# Patient Record
Sex: Female | Born: 1945
Health system: Southern US, Community
[De-identification: ages and names within clinical notes are randomized; demographics above are authoritative.]

## PROBLEM LIST (undated history)

## (undated) DIAGNOSIS — E785 Hyperlipidemia, unspecified: Secondary | ICD-10-CM

## (undated) DIAGNOSIS — B019 Varicella without complication: Secondary | ICD-10-CM

## (undated) DIAGNOSIS — M069 Rheumatoid arthritis, unspecified: Secondary | ICD-10-CM

## (undated) HISTORY — PX: PLACEMENT OF BREAST IMPLANTS: SHX6334

## (undated) HISTORY — DX: Varicella without complication: B01.9

## (undated) HISTORY — PX: BREAST BIOPSY: SHX20

## (undated) HISTORY — DX: Hyperlipidemia, unspecified: E78.5

## (undated) HISTORY — PX: LIPOMA EXCISION: SHX5283

## (undated) HISTORY — DX: Rheumatoid arthritis, unspecified: M06.9

## (undated) HISTORY — PX: SEPTOPLASTY: SUR1290

## (undated) HISTORY — PX: ABDOMINOPLASTY: SUR9

## (undated) HISTORY — PX: RHINOPLASTY: SUR1284

---

## 2008-10-20 DIAGNOSIS — L255 Unspecified contact dermatitis due to plants, except food: Secondary | ICD-10-CM | POA: Insufficient documentation

## 2008-10-20 HISTORY — DX: Unspecified contact dermatitis due to plants, except food: L25.5

## 2011-06-14 LAB — HM COLONOSCOPY

## 2011-11-02 DIAGNOSIS — D170 Benign lipomatous neoplasm of skin and subcutaneous tissue of head, face and neck: Secondary | ICD-10-CM

## 2011-11-02 HISTORY — DX: Benign lipomatous neoplasm of skin and subcutaneous tissue of head, face and neck: D17.0

## 2013-04-18 DIAGNOSIS — R09A2 Foreign body sensation, throat: Secondary | ICD-10-CM | POA: Insufficient documentation

## 2013-04-18 DIAGNOSIS — R198 Other specified symptoms and signs involving the digestive system and abdomen: Secondary | ICD-10-CM

## 2013-04-18 DIAGNOSIS — R0989 Other specified symptoms and signs involving the circulatory and respiratory systems: Secondary | ICD-10-CM

## 2013-04-18 HISTORY — DX: Other specified symptoms and signs involving the digestive system and abdomen: R19.8

## 2013-04-18 HISTORY — DX: Other specified symptoms and signs involving the circulatory and respiratory systems: R09.89

## 2013-04-18 HISTORY — DX: Foreign body sensation, throat: R09.A2

## 2014-10-12 LAB — HM MAMMOGRAPHY: HM Mammogram: NORMAL

## 2014-10-12 LAB — HM PAP SMEAR

## 2015-03-02 ENCOUNTER — Telehealth: Payer: Self-pay | Admitting: Behavioral Health

## 2015-03-02 ENCOUNTER — Encounter: Payer: Self-pay | Admitting: Behavioral Health

## 2015-03-02 NOTE — Telephone Encounter (Signed)
Pre-Visit Call completed with patient and chart updated.   Pre-Visit Info documented in Specialty Comments under [employer].    

## 2015-03-03 ENCOUNTER — Encounter: Payer: Self-pay | Admitting: Family Medicine

## 2015-03-03 ENCOUNTER — Ambulatory Visit (INDEPENDENT_AMBULATORY_CARE_PROVIDER_SITE_OTHER): Payer: Medicare HMO | Admitting: Family Medicine

## 2015-03-03 VITALS — BP 116/70 | HR 63 | Temp 98.6°F | Ht 59.5 in | Wt 128.4 lb

## 2015-03-03 DIAGNOSIS — R32 Unspecified urinary incontinence: Secondary | ICD-10-CM

## 2015-03-03 DIAGNOSIS — R350 Frequency of micturition: Secondary | ICD-10-CM

## 2015-03-03 DIAGNOSIS — M069 Rheumatoid arthritis, unspecified: Secondary | ICD-10-CM | POA: Diagnosis not present

## 2015-03-03 DIAGNOSIS — Z23 Encounter for immunization: Secondary | ICD-10-CM | POA: Diagnosis not present

## 2015-03-03 DIAGNOSIS — M059 Rheumatoid arthritis with rheumatoid factor, unspecified: Secondary | ICD-10-CM | POA: Insufficient documentation

## 2015-03-03 DIAGNOSIS — G4700 Insomnia, unspecified: Secondary | ICD-10-CM | POA: Insufficient documentation

## 2015-03-03 DIAGNOSIS — Z1159 Encounter for screening for other viral diseases: Secondary | ICD-10-CM

## 2015-03-03 HISTORY — DX: Unspecified urinary incontinence: R32

## 2015-03-03 HISTORY — DX: Insomnia, unspecified: G47.00

## 2015-03-03 LAB — POCT URINALYSIS DIPSTICK
BILIRUBIN UA: NEGATIVE
GLUCOSE UA: NEGATIVE
Ketones, UA: NEGATIVE
Leukocytes, UA: NEGATIVE
Nitrite, UA: NEGATIVE
PH UA: 6
Protein, UA: NEGATIVE
RBC UA: NEGATIVE
SPEC GRAV UA: 1.015
UROBILINOGEN UA: NEGATIVE

## 2015-03-03 MED ORDER — TRAZODONE HCL 50 MG PO TABS: 50.0000 mg | ORAL_TABLET | Freq: Every day | ORAL | Status: DC

## 2015-03-03 MED ORDER — MIRABEGRON ER 50 MG PO TB24: 50.0000 mg | ORAL_TABLET | Freq: Every day | ORAL | Status: DC

## 2015-03-03 MED ORDER — MELOXICAM 15 MG PO TABS: 15.0000 mg | ORAL_TABLET | Freq: Every day | ORAL | Status: DC

## 2015-03-03 NOTE — Assessment & Plan Note (Signed)
myrbetriq 50 mg daily rto cpe

## 2015-03-03 NOTE — Progress Notes (Signed)
Patient ID: Stephanie Lara, female   DOB: [birth year], 69 y.o.   MRN: [id number] Patient ID: Stephanie Lara, female    DOB: 01-25-1946  Age: 69 y.o. MRN: [id number].    Subjective:  Subjective HPI. Stephanie Lara presents to establish and c/o urinary incontinence.   She has been told she had a dropped bladder/ uterus.   Review of Systems  Constitutional: Negative for diaphoresis, appetite change, fatigue and unexpected weight change.  Eyes: Negative for pain, redness and visual disturbance.  Respiratory: Negative for cough, chest tightness, shortness of breath and wheezing.   Cardiovascular: Negative for chest pain, palpitations and leg swelling.  Endocrine: Negative for cold intolerance, heat intolerance, polydipsia, polyphagia and polyuria.  Genitourinary: Negative for dysuria, frequency and difficulty urinating.  Neurological: Negative for dizziness, light-headedness, numbness and headaches.    History Past Medical History  Diagnosis Date  . Hyperlipidemia   . Chicken pox   . Rheumatoid arthritis     She has past surgical history that includes Lipoma excision; Rhinoplasty; Septoplasty; Breast biopsy; Placement of breast implants; and Abdominoplasty.   Her family history includes Deep vein thrombosis in her father; Diabetes in her paternal grandmother; Gallbladder disease in her father; Heart attack in her father; Heart disease in her father; Hypertension in her brother and mother; Sudden death in her father.She reports that she has never smoked. She has never used smokeless tobacco. She reports that she drinks alcohol. She reports that she does not use illicit drugs.  Current Outpatient Prescriptions on File Prior to Visit  Medication Sig Dispense Refill  . estradiol (ESTRACE) 0.5 MG tablet Take 0.5 mg by mouth daily.    . progesterone (PROMETRIUM) 100 MG capsule Take 100 mg by mouth daily.     No current facility-administered medications on file prior to visit.     Objective:    Objective Physical Exam  Constitutional: She is oriented to person, place, and time. She appears well-developed and well-nourished.  HENT:  Head: Normocephalic and atraumatic.  Eyes: Conjunctivae and EOM are normal.  Neck: Normal range of motion. Neck supple. No JVD present. Carotid bruit is not present. No thyromegaly present.  Cardiovascular: Normal rate, regular rhythm and normal heart sounds.   No murmur heard. Pulmonary/Chest: Effort normal and breath sounds normal. No respiratory distress. She has no wheezes. She has no rales. She exhibits no tenderness.  Musculoskeletal: She exhibits no edema.  Neurological: She is alert and oriented to person, place, and time.  Psychiatric: She has a normal mood and affect. Her behavior is normal.   BP 116/70 mmHg  Pulse 63  Temp(Src) 98.6 F (37 C) (Oral)  Ht 4' 11.5" (1.511 m)  Wt 128 lb 6.4 oz (58.242 kg)  BMI 25.51 kg/m2  SpO2 98% Wt Readings from Last 3 Encounters:  03/03/15 128 lb 6.4 oz (58.242 kg)     No results found for: WBC, HGB, HCT, PLT, GLUCOSE, CHOL, TRIG, HDL, LDLDIRECT, LDLCALC, ALT, AST, NA, K, CL, CREATININE, BUN, CO2, TSH, PSA, INR, GLUF, HGBA1C, MICROALBUR  Patient was never admitted.   Assessment & Plan:  Plan I have changed Stephanie Lara's meloxicam and traZODone. I am also having her start on mirabegron ER. Additionally, I am having her maintain her estradiol, progesterone, and LATISSE.  Meds ordered this encounter  Medications  . LATISSE 0.03 % ophthalmic solution    Sig:   . meloxicam (MOBIC) 15 MG tablet    Sig: Take 1 tablet (15 mg total) by mouth daily.  Dispense:  90 tablet    Refill:  1  . traZODone (DESYREL) 50 MG tablet    Sig: Take 1 tablet (50 mg total) by mouth at bedtime.    Dispense:  90 tablet    Refill:  1  . mirabegron ER (MYRBETRIQ) 50 MG TB24 tablet    Sig: Take 1 tablet (50 mg total) by mouth daily.    Dispense:  30 tablet    Refill:  5    Problem List Items Addressed This  Visit    Urinary incontinence    myrbetriq 50 mg daily rto cpe      Relevant Medications   mirabegron ER (MYRBETRIQ) 50 MG TB24 tablet   Rheumatoid arthritis - Primary    con't nsaid prn On no other meds      Relevant Medications   meloxicam (MOBIC) 15 MG tablet   Other Relevant Orders   Cyclic citrul peptide antibody, IgG   Insomnia    con't trazadone prn        Other Visit Diagnoses    Urinary frequency        Relevant Orders    POCT urinalysis dipstick (Completed)    Urine culture    Need for hepatitis C screening test        Relevant Orders    Hepatitis C antibody    Encounter for immunization           Follow-up: Return in about 6 months (around 08/31/2015), or if symptoms worsen or fail to improve, for annual exam, fasting.  Garnet Koyanagi, DO

## 2015-03-03 NOTE — Assessment & Plan Note (Signed)
con't trazadone prn  

## 2015-03-03 NOTE — Assessment & Plan Note (Signed)
con't nsaid prn On no other meds

## 2015-03-03 NOTE — Progress Notes (Signed)
Pre visit review using our clinic review tool, if applicable. No additional management support is needed unless otherwise documented below in the visit note. 

## 2015-03-03 NOTE — Patient Instructions (Signed)
Urinary Incontinence Urinary incontinence is the involuntary loss of urine from your bladder. CAUSES  There are many causes of urinary incontinence. They include:  Medicines.  Infections.  Prostatic enlargement, leading to overflow of urine from your bladder.  Surgery.  Neurological diseases.  Emotional factors. SIGNS AND SYMPTOMS Urinary Incontinence can be divided into four types: 1. Urge incontinence. Urge incontinence is the involuntary loss of urine before you have the opportunity to go to the bathroom. There is a sudden urge to void but not enough time to reach a bathroom. 2. Stress incontinence. Stress incontinence is the sudden loss of urine with any activity that forces urine to pass. It is commonly caused by anatomical changes to the pelvis and sphincter areas of your body. 3. Overflow incontinence. Overflow incontinence is the loss of urine from an obstructed opening to your bladder. This results in a backup of urine and a resultant buildup of pressure within the bladder. When the pressure within the bladder exceeds the closing pressure of the sphincter, the urine overflows, which causes incontinence, similar to water overflowing a dam. 4. Total incontinence. Total incontinence is the loss of urine as a result of the inability to store urine within your bladder. DIAGNOSIS  Evaluating the cause of incontinence may require:  A thorough and complete medical and obstetric history.  A complete physical exam.  Laboratory tests such as a urine culture and sensitivities. When additional tests are indicated, they can include:  An ultrasound exam.  Kidney and bladder X-rays.  Cystoscopy. This is an exam of the bladder using a narrow scope.  Urodynamic testing to test the nerve function to the bladder and sphincter areas. TREATMENT  Treatment for urinary incontinence depends on the cause:  For urge incontinence caused by a bacterial infection, antibiotics will be prescribed.  If the urge incontinence is related to medicines you take, your health care Mylissa Lambe may have you change the medicine.  For stress incontinence, surgery to re-establish anatomical support to the bladder or sphincter, or both, will often correct the condition.  For overflow incontinence caused by an enlarged prostate, an operation to open the channel through the enlarged prostate will allow the flow of urine out of the bladder. In women with fibroids, a hysterectomy may be recommended.  For total incontinence, surgery on your urinary sphincter may help. An artificial urinary sphincter (an inflatable cuff placed around the urethra) may be required. In women who have developed a hole-like passage between their bladder and vagina (vesicovaginal fistula), surgery to close the fistula often is required. HOME CARE INSTRUCTIONS  Normal daily hygiene and the use of pads or adult diapers that are changed regularly will help prevent odors and skin damage.  Avoid caffeine. It can overstimulate your bladder.  Use the bathroom regularly. Try about every 2-3 hours to go to the bathroom, even if you do not feel the need to do so. Take time to empty your bladder completely. After urinating, wait a minute. Then try to urinate again.  For causes involving nerve dysfunction, keep a log of the medicines you take and a journal of the times you go to the bathroom. SEEK MEDICAL CARE IF:  You experience worsening of pain instead of improvement in pain after your procedure.  Your incontinence becomes worse instead of better. SEE IMMEDIATE MEDICAL CARE IF:  You experience fever or shaking chills.  You are unable to pass your urine.  You have redness spreading into your groin or down into your thighs. MAKE SURE   YOU:   Understand these instructions.   Will watch your condition.  Will get help right away if you are not doing well or get worse. Document Released: 07/07/2004 Document Revised: 03/20/2013 Document  Reviewed: 11/06/2012 Exit[employer] Patient Information 2015 [employer]. This information is not intended to replace advice given to you by your health care Lelaina Oatis. Make sure you discuss any questions you have with your health care Ladeana Laplant.  

## 2015-03-04 LAB — HEPATITIS C ANTIBODY: HCV Ab: NEGATIVE

## 2015-03-04 LAB — URINE CULTURE
COLONY COUNT: NO GROWTH
Organism ID, Bacteria: NO GROWTH

## 2015-03-05 LAB — CYCLIC CITRUL PEPTIDE ANTIBODY, IGG

## 2015-03-17 ENCOUNTER — Encounter: Payer: Self-pay | Admitting: Family Medicine

## 2015-03-17 ENCOUNTER — Ambulatory Visit (INDEPENDENT_AMBULATORY_CARE_PROVIDER_SITE_OTHER): Payer: Medicare HMO | Admitting: Family Medicine

## 2015-03-17 VITALS — BP 116/68 | HR 71 | Temp 98.2°F | Wt 129.4 lb

## 2015-03-17 DIAGNOSIS — S134XXA Sprain of ligaments of cervical spine, initial encounter: Secondary | ICD-10-CM

## 2015-03-17 DIAGNOSIS — M542 Cervicalgia: Secondary | ICD-10-CM | POA: Diagnosis not present

## 2015-03-17 MED ORDER — TIZANIDINE HCL 4 MG PO TABS: 4.0000 mg | ORAL_TABLET | Freq: Four times a day (QID) | ORAL | Status: DC | PRN

## 2015-03-17 MED ORDER — TRAMADOL HCL 50 MG PO TABS: 50.0000 mg | ORAL_TABLET | Freq: Three times a day (TID) | ORAL | Status: DC | PRN

## 2015-03-17 NOTE — Patient Instructions (Signed)
Cervical Sprain  A cervical sprain is an injury in the neck in which the strong, fibrous tissues (ligaments) that connect your neck bones stretch or tear. Cervical sprains can range from mild to severe. Severe cervical sprains can cause the neck vertebrae to be unstable. This can lead to damage of the spinal cord and can result in serious nervous system problems. The amount of time it takes for a cervical sprain to get better depends on the cause and extent of the injury. Most cervical sprains heal in 1 to 3 weeks.  CAUSES   Severe cervical sprains may be caused by:    Contact sport injuries (such as from football, rugby, wrestling, hockey, auto racing, gymnastics, diving, martial arts, or boxing).    Motor vehicle collisions.    Whiplash injuries. This is an injury from a sudden forward and backward whipping movement of the head and neck.   Falls.   Mild cervical sprains may be caused by:    Being in an awkward position, such as while cradling a telephone between your ear and shoulder.    Sitting in a chair that does not offer proper support.    Working at a poorly designed computer station.    Looking up or down for long periods of time.   SYMPTOMS    Pain, soreness, stiffness, or a burning sensation in the front, back, or sides of the neck. This discomfort may develop immediately after the injury or slowly, 24 hours or more after the injury.    Pain or tenderness directly in the middle of the back of the neck.    Shoulder or upper back pain.    Limited ability to move the neck.    Headache.    Dizziness.    Weakness, numbness, or tingling in the hands or arms.    Muscle spasms.    Difficulty swallowing or chewing.    Tenderness and swelling of the neck.   DIAGNOSIS   Most of the time your health care provider can diagnose a cervical sprain by taking your history and doing a physical exam. Your health care provider will ask about previous neck injuries and any known neck  problems, such as arthritis in the neck. X-rays may be taken to find out if there are any other problems, such as with the bones of the neck. Other tests, such as a CT scan or MRI, may also be needed.   TREATMENT   Treatment depends on the severity of the cervical sprain. Mild sprains can be treated with rest, keeping the neck in place (immobilization), and pain medicines. Severe cervical sprains are immediately immobilized. Further treatment is done to help with pain, muscle spasms, and other symptoms and may include:   Medicines, such as pain relievers, numbing medicines, or muscle relaxants.    Physical therapy. This may involve stretching exercises, strengthening exercises, and posture training. Exercises and improved posture can help stabilize the neck, strengthen muscles, and help stop symptoms from returning.   HOME CARE INSTRUCTIONS    Put ice on the injured area.     Put ice in a plastic bag.     Place a towel between your skin and the bag.     Leave the ice on for 15-20 minutes, 3-4 times a day.    If your injury was severe, you may have been given a cervical collar to wear. A cervical collar is a two-piece collar designed to keep your neck from moving while it heals.      Do not remove the collar unless instructed by your health care provider.    If you have long hair, keep it outside of the collar.    Ask your health care provider before making any adjustments to your collar. Minor adjustments may be required over time to improve comfort and reduce pressure on your chin or on the back of your head.    Ifyou are allowed to remove the collar for cleaning or bathing, follow your health care provider's instructions on how to do so safely.    Keep your collar clean by wiping it with mild soap and water and drying it completely. If the collar you have been given includes removable pads, remove them every 1-2 days and hand wash them with soap and water. Allow them to air dry. They should be completely  dry before you wear them in the collar.    If you are allowed to remove the collar for cleaning and bathing, wash and dry the skin of your neck. Check your skin for irritation or sores. If you see any, tell your health care provider.    Do not drive while wearing the collar.    Only take over-the-counter or prescription medicines for pain, discomfort, or fever as directed by your health care provider.    Keep all follow-up appointments as directed by your health care provider.    Keep all physical therapy appointments as directed by your health care provider.    Make any needed adjustments to your workstation to promote good posture.    Avoid positions and activities that make your symptoms worse.    Warm up and stretch before being active to help prevent problems.   SEEK MEDICAL CARE IF:    Your pain is not controlled with medicine.    You are unable to decrease your pain medicine over time as planned.    Your activity level is not improving as expected.   SEEK IMMEDIATE MEDICAL CARE IF:    You develop any bleeding.   You develop stomach upset.   You have signs of an allergic reaction to your medicine.    Your symptoms get worse.    You develop new, unexplained symptoms.    You have numbness, tingling, weakness, or paralysis in any part of your body.   MAKE SURE YOU:    Understand these instructions.   Will watch your condition.   Will get help right away if you are not doing well or get worse.     This information is not intended to replace advice given to you by your health care provider. Make sure you discuss any questions you have with your health care provider.     Document Released: 03/27/2007 Document Revised: 06/04/2013 Document Reviewed: 12/05/2012  Elsevier Interactive Patient Education 2016 [employer]

## 2015-03-17 NOTE — Progress Notes (Signed)
Pre visit review using our clinic review tool, if applicable. No additional management support is needed unless otherwise documented below in the visit note. 
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
## 2015-04-09 ENCOUNTER — Telehealth: Payer: Self-pay | Admitting: Family Medicine

## 2015-04-09 NOTE — Telephone Encounter (Signed)
Caller name: Hayat Relation to pt: self Call back number: [phone] [place]  Reason for call: Pt came in office wanting to inform that her prescription for traZODone (DESYREL) 50 MG tablet is needed for future orders to be sent to [facility] instead of the pharmacy that is on file, please add the information on pt's records.

## 2015-04-09 NOTE — Telephone Encounter (Signed)
Is she needing a refill at this time?

## 2015-04-09 NOTE — Telephone Encounter (Signed)
Not at this moment but for future refills.

## 2015-04-09 NOTE — Telephone Encounter (Signed)
Pharmacy updated. ° °KP °

## 2015-04-23 ENCOUNTER — Telehealth: Payer: Self-pay

## 2015-04-23 NOTE — Telephone Encounter (Signed)
AWV

## 2015-07-06 ENCOUNTER — Encounter: Payer: Self-pay | Admitting: Family Medicine

## 2015-07-06 MED ORDER — MELOXICAM 15 MG PO TABS: 15.0000 mg | ORAL_TABLET | Freq: Every day | ORAL | Status: DC

## 2015-07-06 NOTE — Telephone Encounter (Signed)
mobic 15 mg #90  1 refill 1 po qd

## 2015-07-16 ENCOUNTER — Ambulatory Visit (INDEPENDENT_AMBULATORY_CARE_PROVIDER_SITE_OTHER): Payer: Medicare HMO | Admitting: Family Medicine

## 2015-07-16 ENCOUNTER — Encounter: Payer: Self-pay | Admitting: Family Medicine

## 2015-07-16 VITALS — BP 140/82 | HR 83 | Temp 98.7°F | Wt 130.2 lb

## 2015-07-16 DIAGNOSIS — R8290 Unspecified abnormal findings in urine: Secondary | ICD-10-CM | POA: Diagnosis not present

## 2015-07-16 DIAGNOSIS — R319 Hematuria, unspecified: Secondary | ICD-10-CM | POA: Diagnosis not present

## 2015-07-16 DIAGNOSIS — K529 Noninfective gastroenteritis and colitis, unspecified: Secondary | ICD-10-CM | POA: Diagnosis not present

## 2015-07-16 LAB — POCT URINALYSIS DIPSTICK
Bilirubin, UA: 17
GLUCOSE UA: NEGATIVE
Ketones, UA: 8
Leukocytes, UA: NEGATIVE
Nitrite, UA: NEGATIVE
PROTEIN UA: 0.3
UROBILINOGEN UA: 0.2
pH, UA: 6

## 2015-07-16 LAB — CBC WITH DIFFERENTIAL/PLATELET
BASOS PCT: 0.2 % (ref 0.0–3.0)
Basophils Absolute: 0 10*3/uL (ref 0.0–0.1)
EOS ABS: 0 10*3/uL (ref 0.0–0.7)
Eosinophils Relative: 0.3 % (ref 0.0–5.0)
HCT: 44 % (ref 36.0–46.0)
Hemoglobin: 14.9 g/dL (ref 12.0–15.0)
LYMPHS ABS: 0.7 10*3/uL (ref 0.7–4.0)
Lymphocytes Relative: 15.8 % (ref 12.0–46.0)
MCHC: 33.9 g/dL (ref 30.0–36.0)
MCV: 95.2 fl (ref 78.0–100.0)
MONO ABS: 0.5 10*3/uL (ref 0.1–1.0)
MONOS PCT: 11.7 % (ref 3.0–12.0)
NEUTROS ABS: 3.2 10*3/uL (ref 1.4–7.7)
NEUTROS PCT: 72 % (ref 43.0–77.0)
PLATELETS: 244 10*3/uL (ref 150.0–400.0)
RBC: 4.62 Mil/uL (ref 3.87–5.11)
RDW: 12.3 % (ref 11.5–15.5)
WBC: 4.5 10*3/uL (ref 4.0–10.5)

## 2015-07-16 LAB — COMPREHENSIVE METABOLIC PANEL
ALT: 23 U/L (ref 0–35)
AST: 30 U/L (ref 0–37)
Albumin: 4.1 g/dL (ref 3.5–5.2)
Alkaline Phosphatase: 42 U/L (ref 39–117)
BILIRUBIN TOTAL: 0.6 mg/dL (ref 0.2–1.2)
BUN: 27 mg/dL — ABNORMAL HIGH (ref 6–23)
CHLORIDE: 103 meq/L (ref 96–112)
CO2: 26 meq/L (ref 19–32)
Calcium: 9 mg/dL (ref 8.4–10.5)
Creatinine, Ser: 0.67 mg/dL (ref 0.40–1.20)
GFR: 92.83 mL/min (ref >60.00)
GLUCOSE: 87 mg/dL (ref 70–99)
Potassium: 3.5 mEq/L (ref 3.5–5.1)
Sodium: 138 mEq/L (ref 135–145)
Total Protein: 7.2 g/dL (ref 6.0–8.3)

## 2015-07-16 MED ORDER — KETOROLAC TROMETHAMINE 60 MG/2ML IM SOLN
60.0000 mg | Freq: Once | INTRAMUSCULAR | Status: AC
  Administered 2015-07-16: 60 mg via INTRAMUSCULAR

## 2015-07-16 MED ORDER — ONDANSETRON HCL 4 MG PO TABS: 4.0000 mg | ORAL_TABLET | Freq: Three times a day (TID) | ORAL | Status: DC | PRN

## 2015-07-16 MED ORDER — PROMETHAZINE HCL 50 MG/ML IJ SOLN
50.0000 mg | Freq: Once | INTRAMUSCULAR | Status: AC
  Administered 2015-07-16: 50 mg via INTRAMUSCULAR

## 2015-07-16 MED FILL — ONDANSETRON HCL 4 MG TABLET: 4 | 7 days supply | Qty: 20 | Fill #0

## 2015-07-16 NOTE — Patient Instructions (Signed)
Norovirus Infection °A norovirus infection is caused by exposure to a virus in a group of similar viruses (noroviruses). This type of infection causes inflammation in your stomach and intestines (gastroenteritis). Norovirus is the most common cause of gastroenteritis. It also causes food poisoning. °Anyone can get a norovirus infection. It spreads very easily (contagious). You can get it from contaminated food, water, surfaces, or other people. Norovirus is found in the stool or vomit of infected people. You can spread the infection as soon as you feel sick until 2 weeks after you recover.  °Symptoms usually begin within 2 days after you become infected. Most norovirus symptoms affect the digestive system. °CAUSES °Norovirus infection is caused by contact with norovirus. You can catch norovirus if you: °· Eat or drink something contaminated with norovirus. °· Touch surfaces or objects contaminated with norovirus and then put your hand in your mouth. °· Have direct contact with an infected person who has symptoms. °· Share food, drink, or utensils with someone with who is sick with norovirus. °SIGNS AND SYMPTOMS °Symptoms of norovirus may include: °· Nausea. °· Vomiting. °· Diarrhea. °· Stomach cramps. °· Fever. °· Chills. °· Headache. °· Muscle aches. °· Tiredness. °DIAGNOSIS °Your health care provider may suspect norovirus based on your symptoms and physical exam. Your health care provider may also test a sample of your stool or vomit for the virus.  °TREATMENT °There is no specific treatment for norovirus. Most people get better without treatment in about 2 days. °HOME CARE INSTRUCTIONS °· Replace lost fluids by drinking plenty of water or rehydration fluids containing important minerals called electrolytes. This prevents dehydration. Drink enough fluid to keep your urine clear or pale yellow. °· Do not prepare food for others while you are infected. Wait at least 3 days after recovering from the illness to do  that. °PREVENTION  °· Wash your hands often, especially after using the toilet or changing a diaper. °· Wash fruits and vegetables thoroughly before preparing or serving them. °· Throw out any food that a sick person may have touched. °· Disinfect contaminated surfaces immediately after someone in the household has been sick. Use a bleach-based household cleaner. °· Immediately remove and wash soiled clothes or sheets. °SEEK MEDICAL CARE IF: °· Your vomiting, diarrhea, and stomach pain is getting worse. °· Your symptoms of norovirus do not go away after 2-3 days. °SEEK IMMEDIATE MEDICAL CARE IF:  °You develop symptoms of dehydration that do not improve with fluid replacement. This may include: °· Excessive sleepiness. °· Lack of tears. °· Dry mouth. °· Dizziness when standing. °· Weak pulse. °  °This information is not intended to replace advice given to you by your health care provider. Make sure you discuss any questions you have with your health care provider. °  °Document Released: 08/20/2002 Document Revised: 06/20/2014 Document Reviewed: 11/07/2013 °Elsevier Interactive Patient Education ©2016 [employer] ° °

## 2015-07-16 NOTE — Progress Notes (Signed)
Pre visit review using our clinic review tool, if applicable. No additional management support is needed unless otherwise documented below in the visit note. 

## 2015-07-17 LAB — URINE CULTURE
COLONY COUNT: NO GROWTH
Organism ID, Bacteria: NO GROWTH

## 2015-07-18 NOTE — Progress Notes (Signed)
Patient ID: Stephanie Lara, female    DOB: Feb 13, 1946  Age: 70 y.o. MRN: [id number]    Subjective:  Subjective HPI Stephanie Lara presents with c/o vomiting and diarrhea x few days.  She had had a sinus infectiion which is clearing up.    Review of Systems  Constitutional: Positive for chills. Negative for fever.  HENT: Positive for congestion. Negative for postnasal drip, rhinorrhea and sinus pressure.   Respiratory: Positive for cough. Negative for chest tightness and wheezing.   Cardiovascular: Negative for chest pain, palpitations and leg swelling.  Gastrointestinal: Positive for nausea, vomiting and diarrhea. Negative for abdominal pain, constipation, blood in stool, abdominal distention and anal bleeding.  Allergic/Immunologic: Negative for environmental allergies.    History Past Medical History  Diagnosis Date  . Hyperlipidemia   . Chicken pox   . Rheumatoid arthritis ([place])     She has past surgical history that includes Lipoma excision; Rhinoplasty; Septoplasty; Breast biopsy; Placement of breast implants; and Abdominoplasty.   Her family history includes Deep vein thrombosis in her father; Diabetes in her paternal grandmother; Gallbladder disease in her father; Heart attack in her father; Heart disease in her father; Hypertension in her brother and mother; Sudden death in her father.She reports that she has never smoked. She has never used smokeless tobacco. She reports that she drinks alcohol. She reports that she does not use illicit drugs.  Current Outpatient Prescriptions on File Prior to Visit  Medication Sig Dispense Refill  . estradiol (ESTRACE) 0.5 MG tablet Take 0.5 mg by mouth daily.    Marland Kitchen LATISSE 0.03 % ophthalmic solution     . meloxicam (MOBIC) 15 MG tablet Take 1 tablet (15 mg total) by mouth daily. 90 tablet 1  . progesterone (PROMETRIUM) 100 MG capsule Take 100 mg by mouth daily.    . traZODone (DESYREL) 50 MG tablet Take 1 tablet (50 mg total) by mouth at  bedtime. 90 tablet 1   No current facility-administered medications on file prior to visit.     Objective:  Objective Physical Exam  Constitutional: She is oriented to person, place, and time. She appears well-developed.  HENT:  Head: Normocephalic and atraumatic.  Eyes: Conjunctivae and EOM are normal.  Neck: Normal range of motion. Neck supple. No JVD present. Carotid bruit is not present. No thyromegaly present.  Cardiovascular: Normal rate, regular rhythm and normal heart sounds.   No murmur heard. Pulmonary/Chest: Effort normal and breath sounds normal. No respiratory distress. She has no wheezes. She has no rales. She exhibits no tenderness.  Musculoskeletal: She exhibits no edema.  Neurological: She is alert and oriented to person, place, and time.  Psychiatric: She has a normal mood and affect.  Nursing note and vitals reviewed.  BP 140/82 mmHg  Pulse 83  Temp(Src) 98.7 F (37.1 C) (Oral)  Wt 130 lb 3.2 oz (59.058 kg)  SpO2 99% Wt Readings from Last 3 Encounters:  07/16/15 130 lb 3.2 oz (59.058 kg)  03/17/15 129 lb 6.4 oz (58.695 kg)  03/03/15 128 lb 6.4 oz (58.242 kg)     Lab Results  Component Value Date   WBC 4.5 07/16/2015   HGB 14.9 07/16/2015   HCT 44.0 07/16/2015   PLT 244.0 07/16/2015   GLUCOSE 87 07/16/2015   ALT 23 07/16/2015   AST 30 07/16/2015   NA 138 07/16/2015   K 3.5 07/16/2015   CL 103 07/16/2015   CREATININE 0.67 07/16/2015   BUN 27* 07/16/2015   CO2 26 07/16/2015  Patient was never admitted.   Assessment & Plan:  Plan I have discontinued Ms. Vandermeulen's mirabegron ER, tiZANidine, and traMADol. I am also having her maintain her estradiol, progesterone, LATISSE, traZODone, meloxicam, and ondansetron. We administered promethazine and ketorolac.  Meds ordered this encounter  Medications  . DISCONTD: ondansetron (ZOFRAN) 4 MG tablet    Sig: Take 1 tablet (4 mg total) by mouth every 8 (eight) hours as needed for nausea or vomiting.     Dispense:  20 tablet    Refill:  0  . ondansetron (ZOFRAN) 4 MG tablet    Sig: Take 1 tablet (4 mg total) by mouth every 8 (eight) hours as needed for nausea or vomiting.    Dispense:  20 tablet    Refill:  0  . promethazine (PHENERGAN) injection 50 mg    Sig:   . ketorolac (TORADOL) injection 60 mg    Sig:     Problem List Items Addressed This Visit    None    Visit Diagnoses    Noninfectious gastroenteritis, unspecified    -  Primary    Relevant Medications    ondansetron (ZOFRAN) 4 MG tablet    promethazine (PHENERGAN) injection 50 mg (Completed)    ketorolac (TORADOL) injection 60 mg (Completed)    Other Relevant Orders    Comp Met (CMET) (Completed)    CBC with Differential/Platelet (Completed)    POCT urinalysis dipstick (Completed)    Hematuria        Relevant Orders    Urine culture (Completed)    Abnormal finding in urine        Relevant Orders    Urine culture (Completed)       Follow-up: Return if symptoms worsen or fail to improve.  Garnet Koyanagi, DO

## 2015-08-24 ENCOUNTER — Telehealth: Payer: Self-pay | Admitting: *Deleted

## 2015-08-24 NOTE — Telephone Encounter (Signed)
Received fax from [facility] requesting RA Diagnosis/Treatment Verification; forwarded to provider/SLS 03/13

## 2015-08-31 ENCOUNTER — Telehealth: Payer: Self-pay | Admitting: Behavioral Health

## 2015-08-31 NOTE — Telephone Encounter (Signed)
Unable to reach patient at time of Pre-Visit Call.  Left message for patient to return call when available.    

## 2015-09-01 ENCOUNTER — Ambulatory Visit (INDEPENDENT_AMBULATORY_CARE_PROVIDER_SITE_OTHER): Payer: Medicare HMO | Admitting: Family Medicine

## 2015-09-01 ENCOUNTER — Encounter: Payer: Self-pay | Admitting: Family Medicine

## 2015-09-01 VITALS — BP 124/85 | HR 62 | Temp 98.1°F | Ht 60.0 in | Wt 133.0 lb

## 2015-09-01 DIAGNOSIS — Z0000 Encounter for general adult medical examination without abnormal findings: Secondary | ICD-10-CM | POA: Diagnosis not present

## 2015-09-01 DIAGNOSIS — E559 Vitamin D deficiency, unspecified: Secondary | ICD-10-CM

## 2015-09-01 DIAGNOSIS — N393 Stress incontinence (female) (male): Secondary | ICD-10-CM | POA: Diagnosis not present

## 2015-09-01 DIAGNOSIS — Z780 Asymptomatic menopausal state: Secondary | ICD-10-CM

## 2015-09-01 LAB — COMPREHENSIVE METABOLIC PANEL
ALBUMIN: 4.2 g/dL (ref 3.5–5.2)
ALK PHOS: 36 U/L — AB (ref 39–117)
ALT: 25 U/L (ref 0–35)
AST: 25 U/L (ref 0–37)
BUN: 23 mg/dL (ref 6–23)
CALCIUM: 9.6 mg/dL (ref 8.4–10.5)
CHLORIDE: 103 meq/L (ref 96–112)
CO2: 32 mEq/L (ref 19–32)
Creatinine, Ser: 0.67 mg/dL (ref 0.40–1.20)
GFR: 92.8 mL/min (ref >60.00)
Glucose, Bld: 92 mg/dL (ref 70–99)
POTASSIUM: 4.4 meq/L (ref 3.5–5.1)
Sodium: 139 mEq/L (ref 135–145)
TOTAL PROTEIN: 6.6 g/dL (ref 6.0–8.3)
Total Bilirubin: 1.3 mg/dL — ABNORMAL HIGH (ref 0.2–1.2)

## 2015-09-01 LAB — CBC WITH DIFFERENTIAL/PLATELET
BASOS PCT: 0.3 % (ref 0.0–3.0)
Basophils Absolute: 0 10*3/uL (ref 0.0–0.1)
EOS PCT: 1 % (ref 0.0–5.0)
Eosinophils Absolute: 0 10*3/uL (ref 0.0–0.7)
HEMATOCRIT: 38.3 % (ref 36.0–46.0)
HEMOGLOBIN: 13.1 g/dL (ref 12.0–15.0)
Lymphocytes Relative: 24 % (ref 12.0–46.0)
Lymphs Abs: 1 10*3/uL (ref 0.7–4.0)
MCHC: 34.1 g/dL (ref 30.0–36.0)
MCV: 94.6 fl (ref 78.0–100.0)
MONO ABS: 0.4 10*3/uL (ref 0.1–1.0)
MONOS PCT: 8.9 % (ref 3.0–12.0)
Neutro Abs: 2.8 10*3/uL (ref 1.4–7.7)
Neutrophils Relative %: 65.8 % (ref 43.0–77.0)
Platelets: 240 10*3/uL (ref 150.0–400.0)
RBC: 4.05 Mil/uL (ref 3.87–5.11)
RDW: 13 % (ref 11.5–15.5)
WBC: 4.3 10*3/uL (ref 4.0–10.5)

## 2015-09-01 LAB — POCT URINALYSIS DIPSTICK
Bilirubin, UA: NEGATIVE
Blood, UA: NEGATIVE
GLUCOSE UA: NEGATIVE
Ketones, UA: NEGATIVE
LEUKOCYTES UA: NEGATIVE
Nitrite, UA: NEGATIVE
Protein, UA: NEGATIVE
SPEC GRAV UA: 1.01
UROBILINOGEN UA: 0.2
pH, UA: 7

## 2015-09-01 LAB — LIPID PANEL
CHOLESTEROL: 240 mg/dL — AB (ref 0–200)
HDL: 67.7 mg/dL (ref >39.00)
LDL CALC: 158 mg/dL — AB (ref 0–99)
NonHDL: 172.17
TRIGLYCERIDES: 70 mg/dL (ref 0.0–149.0)
Total CHOL/HDL Ratio: 4
VLDL: 14 mg/dL (ref 0.0–40.0)

## 2015-09-01 NOTE — Progress Notes (Signed)
Pre visit review using our clinic review tool, if applicable. No additional management support is needed unless otherwise documented below in the visit note. 

## 2015-09-01 NOTE — Patient Instructions (Signed)
Preventive Care for Adults, Female A healthy lifestyle and preventive care can promote health and wellness. Preventive health guidelines for women include the following key practices.  A routine yearly physical is a good way to check with your health care provider about your health and preventive screening. It is a chance to share any concerns and updates on your health and to receive a thorough exam.  Visit your dentist for a routine exam and preventive care every 6 months. Brush your teeth twice a day and floss once a day. Good oral hygiene prevents tooth decay and gum disease.  The frequency of eye exams is based on your age, health, family medical history, use of contact lenses, and other factors. Follow your health care provider's recommendations for frequency of eye exams.  Eat a healthy diet. Foods like vegetables, fruits, whole grains, low-fat dairy products, and lean protein foods contain the nutrients you need without too many calories. Decrease your intake of foods high in solid fats, added sugars, and salt. Eat the right amount of calories for you.Get information about a proper diet from your health care provider, if necessary.  Regular physical exercise is one of the most important things you can do for your health. Most adults should get at least 150 minutes of moderate-intensity exercise (any activity that increases your heart rate and causes you to sweat) each week. In addition, most adults need muscle-strengthening exercises on 2 or more days a week.  Maintain a healthy weight. The body mass index (BMI) is a screening tool to identify possible weight problems. It provides an estimate of body fat based on height and weight. Your health care provider can find your BMI and can help you achieve or maintain a healthy weight.For adults 20 years and older:  A BMI below 18.5 is considered underweight.  A BMI of 18.5 to 24.9 is normal.  A BMI of 25 to 29.9 is considered overweight.  A  BMI of 30 and above is considered obese.  Maintain normal blood lipids and cholesterol levels by exercising and minimizing your intake of saturated fat. Eat a balanced diet with plenty of fruit and vegetables. Blood tests for lipids and cholesterol should begin at age 45 and be repeated every 5 years. If your lipid or cholesterol levels are high, you are over 50, or you are at high risk for heart disease, you may need your cholesterol levels checked more frequently.Ongoing high lipid and cholesterol levels should be treated with medicines if diet and exercise are not working.  If you smoke, find out from your health care provider how to quit. If you do not use tobacco, do not start.  Lung cancer screening is recommended for adults aged 45-80 years who are at high risk for developing lung cancer because of a history of smoking. A yearly low-dose CT scan of the lungs is recommended for people who have at least a 30-pack-year history of smoking and are a current smoker or have quit within the past 15 years. A pack year of smoking is smoking an average of 1 pack of cigarettes a day for 1 year (for example: 1 pack a day for 30 years or 2 packs a day for 15 years). Yearly screening should continue until the smoker has stopped smoking for at least 15 years. Yearly screening should be stopped for people who develop a health problem that would prevent them from having lung cancer treatment.  If you are pregnant, do not drink alcohol. If you are  breastfeeding, be very cautious about drinking alcohol. If you are not pregnant and choose to drink alcohol, do not have more than 1 drink per day. One drink is considered to be 12 ounces (355 mL) of beer, 5 ounces (148 mL) of wine, or 1.5 ounces (44 mL) of liquor.  Avoid use of street drugs. Do not share needles with anyone. Ask for help if you need support or instructions about stopping the use of drugs.  High blood pressure causes heart disease and increases the risk  of stroke. Your blood pressure should be checked at least every 1 to 2 years. Ongoing high blood pressure should be treated with medicines if weight loss and exercise do not work.  If you are 55-79 years old, ask your health care provider if you should take aspirin to prevent strokes.  Diabetes screening is done by taking a blood sample to check your blood glucose level after you have not eaten for a certain period of time (fasting). If you are not overweight and you do not have risk factors for diabetes, you should be screened once every 3 years starting at age 45. If you are overweight or obese and you are 40-70 years of age, you should be screened for diabetes every year as part of your cardiovascular risk assessment.  Breast cancer screening is essential preventive care for women. You should practice "breast self-awareness." This means understanding the normal appearance and feel of your breasts and may include breast self-examination. Any changes detected, no matter how small, should be reported to a health care provider. Women in their 20s and 30s should have a clinical breast exam (CBE) by a health care provider as part of a regular health exam every 1 to 3 years. After age 40, women should have a CBE every year. Starting at age 40, women should consider having a mammogram (breast X-ray test) every year. Women who have a family history of breast cancer should talk to their health care provider about genetic screening. Women at a high risk of breast cancer should talk to their health care providers about having an MRI and a mammogram every year.  Breast cancer gene (BRCA)-related cancer risk assessment is recommended for women who have family members with BRCA-related cancers. BRCA-related cancers include breast, ovarian, tubal, and peritoneal cancers. Having family members with these cancers may be associated with an increased risk for harmful changes (mutations) in the breast cancer genes BRCA1 and  BRCA2. Results of the assessment will determine the need for genetic counseling and BRCA1 and BRCA2 testing.  Your health care provider may recommend that you be screened regularly for cancer of the pelvic organs (ovaries, uterus, and vagina). This screening involves a pelvic examination, including checking for microscopic changes to the surface of your cervix (Pap test). You may be encouraged to have this screening done every 3 years, beginning at age 21.  For women ages 30-65, health care providers may recommend pelvic exams and Pap testing every 3 years, or they may recommend the Pap and pelvic exam, combined with testing for human papilloma virus (HPV), every 5 years. Some types of HPV increase your risk of cervical cancer. Testing for HPV may also be done on women of any age with unclear Pap test results.  Other health care providers may not recommend any screening for nonpregnant women who are considered low risk for pelvic cancer and who do not have symptoms. Ask your health care provider if a screening pelvic exam is right for   you.  If you have had past treatment for cervical cancer or a condition that could lead to cancer, you need Pap tests and screening for cancer for at least 20 years after your treatment. If Pap tests have been discontinued, your risk factors (such as having a new sexual partner) need to be reassessed to determine if screening should resume. Some women have medical problems that increase the chance of getting cervical cancer. In these cases, your health care provider may recommend more frequent screening and Pap tests.  Colorectal cancer can be detected and often prevented. Most routine colorectal cancer screening begins at the age of 50 years and continues through age 75 years. However, your health care provider may recommend screening at an earlier age if you have risk factors for colon cancer. On a yearly basis, your health care provider may provide home test kits to check  for hidden blood in the stool. Use of a small camera at the end of a tube, to directly examine the colon (sigmoidoscopy or colonoscopy), can detect the earliest forms of colorectal cancer. Talk to your health care provider about this at age 50, when routine screening begins. Direct exam of the colon should be repeated every 5-10 years through age 75 years, unless early forms of precancerous polyps or small growths are found.  People who are at an increased risk for hepatitis B should be screened for this virus. You are considered at high risk for hepatitis B if:  You were born in a country where hepatitis B occurs often. Talk with your health care provider about which countries are considered high risk.  Your parents were born in a high-risk country and you have not received a shot to protect against hepatitis B (hepatitis B vaccine).  You have HIV or AIDS.  You use needles to inject street drugs.  You live with, or have sex with, someone who has hepatitis B.  You get hemodialysis treatment.  You take certain medicines for conditions like cancer, organ transplantation, and autoimmune conditions.  Hepatitis C blood testing is recommended for all people born from 1945 through 1965 and any individual with known risks for hepatitis C.  Practice safe sex. Use condoms and avoid high-risk sexual practices to reduce the spread of sexually transmitted infections (STIs). STIs include gonorrhea, chlamydia, syphilis, trichomonas, herpes, HPV, and human immunodeficiency virus (HIV). Herpes, HIV, and HPV are viral illnesses that have no cure. They can result in disability, cancer, and death.  You should be screened for sexually transmitted illnesses (STIs) including gonorrhea and chlamydia if:  You are sexually active and are younger than 24 years.  You are older than 24 years and your health care provider tells you that you are at risk for this type of infection.  Your sexual activity has changed  since you were last screened and you are at an increased risk for chlamydia or gonorrhea. Ask your health care provider if you are at risk.  If you are at risk of being infected with HIV, it is recommended that you take a prescription medicine daily to prevent HIV infection. This is called preexposure prophylaxis (PrEP). You are considered at risk if:  You are sexually active and do not regularly use condoms or know the HIV status of your partner(s).  You take drugs by injection.  You are sexually active with a partner who has HIV.  Talk with your health care provider about whether you are at high risk of being infected with HIV. If   you choose to begin PrEP, you should first be tested for HIV. You should then be tested every 3 months for as long as you are taking PrEP.  Osteoporosis is a disease in which the bones lose minerals and strength with aging. This can result in serious bone fractures or breaks. The risk of osteoporosis can be identified using a bone density scan. Women ages 67 years and over and women at risk for fractures or osteoporosis should discuss screening with their health care providers. Ask your health care provider whether you should take a calcium supplement or vitamin D to reduce the rate of osteoporosis.  Menopause can be associated with physical symptoms and risks. Hormone replacement therapy is available to decrease symptoms and risks. You should talk to your health care provider about whether hormone replacement therapy is right for you.  Use sunscreen. Apply sunscreen liberally and repeatedly throughout the day. You should seek shade when your shadow is shorter than you. Protect yourself by wearing long sleeves, pants, a wide-brimmed hat, and sunglasses year round, whenever you are outdoors.  Once a month, do a whole body skin exam, using a mirror to look at the skin on your back. Tell your health care provider of new moles, moles that have irregular borders, moles that  are larger than a pencil eraser, or moles that have changed in shape or color.  Stay current with required vaccines (immunizations).  Influenza vaccine. All adults should be immunized every year.  Tetanus, diphtheria, and acellular pertussis (Td, Tdap) vaccine. Pregnant women should receive 1 dose of Tdap vaccine during each pregnancy. The dose should be obtained regardless of the length of time since the last dose. Immunization is preferred during the 27th-36th week of gestation. An adult who has not previously received Tdap or who does not know her vaccine status should receive 1 dose of Tdap. This initial dose should be followed by tetanus and diphtheria toxoids (Td) booster doses every 10 years. Adults with an unknown or incomplete history of completing a 3-dose immunization series with Td-containing vaccines should begin or complete a primary immunization series including a Tdap dose. Adults should receive a Td booster every 10 years.  Varicella vaccine. An adult without evidence of immunity to varicella should receive 2 doses or a second dose if she has previously received 1 dose. Pregnant females who do not have evidence of immunity should receive the first dose after pregnancy. This first dose should be obtained before leaving the health care facility. The second dose should be obtained 4-8 weeks after the first dose.  Human papillomavirus (HPV) vaccine. Females aged 13-26 years who have not received the vaccine previously should obtain the 3-dose series. The vaccine is not recommended for use in pregnant females. However, pregnancy testing is not needed before receiving a dose. If a female is found to be pregnant after receiving a dose, no treatment is needed. In that case, the remaining doses should be delayed until after the pregnancy. Immunization is recommended for any person with an immunocompromised condition through the age of 61 years if she did not get any or all doses earlier. During the  3-dose series, the second dose should be obtained 4-8 weeks after the first dose. The third dose should be obtained 24 weeks after the first dose and 16 weeks after the second dose.  Zoster vaccine. One dose is recommended for adults aged 30 years or older unless certain conditions are present.  Measles, mumps, and rubella (MMR) vaccine. Adults born  before 1957 generally are considered immune to measles and mumps. Adults born in 1957 or later should have 1 or more doses of MMR vaccine unless there is a contraindication to the vaccine or there is laboratory evidence of immunity to each of the three diseases. A routine second dose of MMR vaccine should be obtained at least 28 days after the first dose for students attending postsecondary schools, health care workers, or international travelers. People who received inactivated measles vaccine or an unknown type of measles vaccine during 1963-1967 should receive 2 doses of MMR vaccine. People who received inactivated mumps vaccine or an unknown type of mumps vaccine before 1979 and are at high risk for mumps infection should consider immunization with 2 doses of MMR vaccine. For females of childbearing age, rubella immunity should be determined. If there is no evidence of immunity, females who are not pregnant should be vaccinated. If there is no evidence of immunity, females who are pregnant should delay immunization until after pregnancy. Unvaccinated health care workers born before 1957 who lack laboratory evidence of measles, mumps, or rubella immunity or laboratory confirmation of disease should consider measles and mumps immunization with 2 doses of MMR vaccine or rubella immunization with 1 dose of MMR vaccine.  Pneumococcal 13-valent conjugate (PCV13) vaccine. When indicated, a person who is uncertain of his immunization history and has no record of immunization should receive the PCV13 vaccine. All adults 65 years of age and older should receive this  vaccine. An adult aged 19 years or older who has certain medical conditions and has not been previously immunized should receive 1 dose of PCV13 vaccine. This PCV13 should be followed with a dose of pneumococcal polysaccharide (PPSV23) vaccine. Adults who are at high risk for pneumococcal disease should obtain the PPSV23 vaccine at least 8 weeks after the dose of PCV13 vaccine. Adults older than 69 years of age who have normal immune system function should obtain the PPSV23 vaccine dose at least 1 year after the dose of PCV13 vaccine.  Pneumococcal polysaccharide (PPSV23) vaccine. When PCV13 is also indicated, PCV13 should be obtained first. All adults aged 65 years and older should be immunized. An adult younger than age 65 years who has certain medical conditions should be immunized. Any person who resides in a nursing home or long-term care facility should be immunized. An adult smoker should be immunized. People with an immunocompromised condition and certain other conditions should receive both PCV13 and PPSV23 vaccines. People with human immunodeficiency virus (HIV) infection should be immunized as soon as possible after diagnosis. Immunization during chemotherapy or radiation therapy should be avoided. Routine use of PPSV23 vaccine is not recommended for American Indians, [place] Natives, or people younger than 65 years unless there are medical conditions that require PPSV23 vaccine. When indicated, people who have unknown immunization and have no record of immunization should receive PPSV23 vaccine. One-time revaccination 5 years after the first dose of PPSV23 is recommended for people aged 19-64 years who have chronic kidney failure, nephrotic syndrome, asplenia, or immunocompromised conditions. People who received 1-2 doses of PPSV23 before age 65 years should receive another dose of PPSV23 vaccine at age 65 years or later if at least 5 years have passed since the previous dose. Doses of PPSV23 are not  needed for people immunized with PPSV23 at or after age 65 years.  Meningococcal vaccine. Adults with asplenia or persistent complement component deficiencies should receive 2 doses of quadrivalent meningococcal conjugate (MenACWY-D) vaccine. The doses should be obtained   at least 2 months apart. Microbiologists working with certain meningococcal bacteria, [place] recruits, people at risk during an outbreak, and people who travel to or live in countries with a high rate of meningitis should be immunized. A first-year college student up through age 34 years who is living in a residence hall should receive a dose if she did not receive a dose on or after her 16th birthday. Adults who have certain high-risk conditions should receive one or more doses of vaccine.  Hepatitis A vaccine. Adults who wish to be protected from this disease, have certain high-risk conditions, work with hepatitis A-infected animals, work in hepatitis A research labs, or travel to or work in countries with a high rate of hepatitis A should be immunized. Adults who were previously unvaccinated and who anticipate close contact with an international adoptee during the first 60 days after arrival in the [place] from a country with a high rate of hepatitis A should be immunized.  Hepatitis B vaccine. Adults who wish to be protected from this disease, have certain high-risk conditions, may be exposed to blood or other infectious body fluids, are household contacts or sex partners of hepatitis B positive people, are clients or workers in certain care facilities, or travel to or work in countries with a high rate of hepatitis B should be immunized.  Haemophilus influenzae type b (Hib) vaccine. A previously unvaccinated person with asplenia or sickle cell disease or having a scheduled splenectomy should receive 1 dose of Hib vaccine. Regardless of previous immunization, a recipient of a hematopoietic stem cell transplant should receive a  3-dose series 6-12 months after her successful transplant. Hib vaccine is not recommended for adults with HIV infection. Preventive Services / Frequency Ages 35 to 4 years  Blood pressure check.** / Every 3-5 years.  Lipid and cholesterol check.** / Every 5 years beginning at age 60.  Clinical breast exam.** / Every 3 years for women in their 71s and 10s.  BRCA-related cancer risk assessment.** / For women who have family members with a BRCA-related cancer (breast, ovarian, tubal, or peritoneal cancers).  Pap test.** / Every 2 years from ages 76 through 26. Every 3 years starting at age 61 through age 76 or 93 with a history of 3 consecutive normal Pap tests.  HPV screening.** / Every 3 years from ages 37 through ages 60 to 51 with a history of 3 consecutive normal Pap tests.  Hepatitis C blood test.** / For any individual with known risks for hepatitis C.  Skin self-exam. / Monthly.  Influenza vaccine. / Every year.  Tetanus, diphtheria, and acellular pertussis (Tdap, Td) vaccine.** / Consult your health care provider. Pregnant women should receive 1 dose of Tdap vaccine during each pregnancy. 1 dose of Td every 10 years.  Varicella vaccine.** / Consult your health care provider. Pregnant females who do not have evidence of immunity should receive the first dose after pregnancy.  HPV vaccine. / 3 doses over 6 months, if 93 and younger. The vaccine is not recommended for use in pregnant females. However, pregnancy testing is not needed before receiving a dose.  Measles, mumps, rubella (MMR) vaccine.** / You need at least 1 dose of MMR if you were born in 1957 or later. You may also need a 2nd dose. For females of childbearing age, rubella immunity should be determined. If there is no evidence of immunity, females who are not pregnant should be vaccinated. If there is no evidence of immunity, females who are  pregnant should delay immunization until after pregnancy.  Pneumococcal  13-valent conjugate (PCV13) vaccine.** / Consult your health care provider.  Pneumococcal polysaccharide (PPSV23) vaccine.** / 1 to 2 doses if you smoke cigarettes or if you have certain conditions.  Meningococcal vaccine.** / 1 dose if you are age 68 to 8 years and a Market researcher living in a residence hall, or have one of several medical conditions, you need to get vaccinated against meningococcal disease. You may also need additional booster doses.  Hepatitis A vaccine.** / Consult your health care provider.  Hepatitis B vaccine.** / Consult your health care provider.  Haemophilus influenzae type b (Hib) vaccine.** / Consult your health care provider. Ages 7 to 53 years  Blood pressure check.** / Every year.  Lipid and cholesterol check.** / Every 5 years beginning at age 25 years.  Lung cancer screening. / Every year if you are aged 11-80 years and have a 30-pack-year history of smoking and currently smoke or have quit within the past 15 years. Yearly screening is stopped once you have quit smoking for at least 15 years or develop a health problem that would prevent you from having lung cancer treatment.  Clinical breast exam.** / Every year after age 48 years.  BRCA-related cancer risk assessment.** / For women who have family members with a BRCA-related cancer (breast, ovarian, tubal, or peritoneal cancers).  Mammogram.** / Every year beginning at age 41 years and continuing for as long as you are in good health. Consult with your health care provider.  Pap test.** / Every 3 years starting at age 65 years through age 37 or 70 years with a history of 3 consecutive normal Pap tests.  HPV screening.** / Every 3 years from ages 72 years through ages 60 to 40 years with a history of 3 consecutive normal Pap tests.  Fecal occult blood test (FOBT) of stool. / Every year beginning at age 21 years and continuing until age 5 years. You may not need to do this test if you get  a colonoscopy every 10 years.  Flexible sigmoidoscopy or colonoscopy.** / Every 5 years for a flexible sigmoidoscopy or every 10 years for a colonoscopy beginning at age 35 years and continuing until age 48 years.  Hepatitis C blood test.** / For all people born from [birth year] through 1965 and any individual with known risks for hepatitis C.  Skin self-exam. / Monthly.  Influenza vaccine. / Every year.  Tetanus, diphtheria, and acellular pertussis (Tdap/Td) vaccine.** / Consult your health care provider. Pregnant women should receive 1 dose of Tdap vaccine during each pregnancy. 1 dose of Td every 10 years.  Varicella vaccine.** / Consult your health care provider. Pregnant females who do not have evidence of immunity should receive the first dose after pregnancy.  Zoster vaccine.** / 1 dose for adults aged 30 years or older.  Measles, mumps, rubella (MMR) vaccine.** / You need at least 1 dose of MMR if you were born in 1957 or later. You may also need a second dose. For females of childbearing age, rubella immunity should be determined. If there is no evidence of immunity, females who are not pregnant should be vaccinated. If there is no evidence of immunity, females who are pregnant should delay immunization until after pregnancy.  Pneumococcal 13-valent conjugate (PCV13) vaccine.** / Consult your health care provider.  Pneumococcal polysaccharide (PPSV23) vaccine.** / 1 to 2 doses if you smoke cigarettes or if you have certain conditions.  Meningococcal vaccine.** /  Consult your health care provider.  Hepatitis A vaccine.** / Consult your health care provider.  Hepatitis B vaccine.** / Consult your health care provider.  Haemophilus influenzae type b (Hib) vaccine.** / Consult your health care provider. Ages 64 years and over  Blood pressure check.** / Every year.  Lipid and cholesterol check.** / Every 5 years beginning at age 23 years.  Lung cancer screening. / Every year if you  are aged 16-80 years and have a 30-pack-year history of smoking and currently smoke or have quit within the past 15 years. Yearly screening is stopped once you have quit smoking for at least 15 years or develop a health problem that would prevent you from having lung cancer treatment.  Clinical breast exam.** / Every year after age 74 years.  BRCA-related cancer risk assessment.** / For women who have family members with a BRCA-related cancer (breast, ovarian, tubal, or peritoneal cancers).  Mammogram.** / Every year beginning at age 44 years and continuing for as long as you are in good health. Consult with your health care provider.  Pap test.** / Every 3 years starting at age 58 years through age 22 or 39 years with 3 consecutive normal Pap tests. Testing can be stopped between 65 and 70 years with 3 consecutive normal Pap tests and no abnormal Pap or HPV tests in the past 10 years.  HPV screening.** / Every 3 years from ages 64 years through ages 70 or 61 years with a history of 3 consecutive normal Pap tests. Testing can be stopped between 65 and 70 years with 3 consecutive normal Pap tests and no abnormal Pap or HPV tests in the past 10 years.  Fecal occult blood test (FOBT) of stool. / Every year beginning at age 40 years and continuing until age 27 years. You may not need to do this test if you get a colonoscopy every 10 years.  Flexible sigmoidoscopy or colonoscopy.** / Every 5 years for a flexible sigmoidoscopy or every 10 years for a colonoscopy beginning at age 7 years and continuing until age 32 years.  Hepatitis C blood test.** / For all people born from [birth year] through 1965 and any individual with known risks for hepatitis C.  Osteoporosis screening.** / A one-time screening for women ages 30 years and over and women at risk for fractures or osteoporosis.  Skin self-exam. / Monthly.  Influenza vaccine. / Every year.  Tetanus, diphtheria, and acellular pertussis (Tdap/Td)  vaccine.** / 1 dose of Td every 10 years.  Varicella vaccine.** / Consult your health care provider.  Zoster vaccine.** / 1 dose for adults aged 35 years or older.  Pneumococcal 13-valent conjugate (PCV13) vaccine.** / Consult your health care provider.  Pneumococcal polysaccharide (PPSV23) vaccine.** / 1 dose for all adults aged 46 years and older.  Meningococcal vaccine.** / Consult your health care provider.  Hepatitis A vaccine.** / Consult your health care provider.  Hepatitis B vaccine.** / Consult your health care provider.  Haemophilus influenzae type b (Hib) vaccine.** / Consult your health care provider. ** Family history and personal history of risk and conditions may change your health care provider's recommendations.   This information is not intended to replace advice given to you by your health care provider. Make sure you discuss any questions you have with your health care provider.   Document Released: 07/26/2001 Document Revised: 06/20/2014 Document Reviewed: 10/25/2010 Elsevier Interactive Patient Education [employer].

## 2015-09-01 NOTE — Progress Notes (Signed)
Subjective:   Stephanie Lara is a 70 y.o. female who presents for Medicare Annual (Subsequent) preventive examination.  Review of Systems:   Review of Systems  Constitutional: Negative for activity change, appetite change and fatigue.  HENT: Negative for hearing loss, congestion, tinnitus and ear discharge.   Eyes: Negative for visual disturbance (see optho q1y -- vision corrected to 20/20 with glasses).  Respiratory: Negative for cough, chest tightness and shortness of breath.   Cardiovascular: Negative for chest pain, palpitations and leg swelling.  Gastrointestinal: Negative for abdominal pain, diarrhea, constipation and abdominal distention.  Genitourinary: Negative for urgency, frequency, decreased urine volume and difficulty urinating.  Musculoskeletal: Negative for back pain, arthralgias and gait problem.  Skin: Negative for color change, pallor and rash.  Neurological: Negative for dizziness, light-headedness, numbness and headaches.  Hematological: Negative for adenopathy. Does not bruise/bleed easily.  Psychiatric/Behavioral: Negative for suicidal ideas, confusion, sleep disturbance, self-injury, dysphoric mood, decreased concentration and agitation.  Pt is able to read and write and can do all ADLs No risk for falling No abuse/ violence in home          Objective:     Vitals: BP 124/85 mmHg  Pulse 62  Temp(Src) 98.1 F (36.7 C) (Oral)  Ht 5' (1.524 m)  Wt 133 lb (60.328 kg)  BMI 25.97 kg/m2  SpO2 99%  Body mass index is 25.97 kg/(m^2). BP 124/85 mmHg  Pulse 62  Temp(Src) 98.1 F (36.7 C) (Oral)  Ht 5' (1.524 m)  Wt 133 lb (60.328 kg)  BMI 25.97 kg/m2  SpO2 99% General appearance: alert, cooperative, appears stated age and no distress Head: Normocephalic, without obvious abnormality, atraumatic Eyes: conjunctivae/corneas clear. PERRL, EOM's intact. Fundi benign. Ears: normal TM's and external ear canals both ears Nose: Nares normal. Septum midline.  Mucosa normal. No drainage or sinus tenderness. Throat: lips, mucosa, and tongue normal; teeth and gums normal Neck: no adenopathy, no carotid bruit, no JVD, supple, symmetrical, trachea midline and thyroid not enlarged, symmetric, no tenderness/mass/nodules Back: symmetric, no curvature. ROM normal. No CVA tenderness. Lungs: clear to auscultation bilaterally Breasts: normal appearance, no masses or tenderness Heart: regular rate and rhythm, S1, S2 normal, no murmur, click, rub or gallop Abdomen: soft, non-tender; bowel sounds normal; no masses,  no organomegaly Pelvic: deferred Extremities: extremities normal, atraumatic, no cyanosis or edema Pulses: 2+ and symmetric Skin: Skin color, texture, turgor normal. No rashes or lesions Lymph nodes: Cervical, supraclavicular, and axillary nodes normal. Neurologic: Alert and oriented X 3, normal strength and tone. Normal symmetric reflexes. Normal coordination and gait Psych- no depression, no anxiety  Tobacco History  Smoking status  . Never Smoker   Smokeless tobacco  . Never Used     Counseling given: Not Answered   Past Medical History  Diagnosis Date  . Hyperlipidemia   . Chicken pox   . Rheumatoid arthritis [facility])    Past Surgical History  Procedure Laterality Date  . Lipoma excision      neck  . Rhinoplasty    . Septoplasty    . Breast biopsy    . Placement of breast implants    . Abdominoplasty     Family History  Problem Relation Age of Onset  . Diabetes Paternal Grandmother   . Heart disease Father   . Hypertension Mother   . Hypertension Brother   . Sudden death Father   . Heart attack Father   . Gallbladder disease Father   . Deep vein thrombosis Father  History  Sexual Activity  . Sexual Activity:  . Partners: Male    Outpatient Encounter Prescriptions as of 09/01/2015  Medication Sig  . estradiol (ESTRACE) 0.5 MG tablet Take 0.5 mg by mouth daily.  Marland Kitchen LATISSE 0.03 % ophthalmic solution   . meloxicam  (MOBIC) 15 MG tablet Take 1 tablet (15 mg total) by mouth daily.  . progesterone (PROMETRIUM) 100 MG capsule Take 100 mg by mouth daily.  . traZODone (DESYREL) 50 MG tablet Take 1 tablet (50 mg total) by mouth at bedtime.  . [DISCONTINUED] ondansetron (ZOFRAN) 4 MG tablet Take 1 tablet (4 mg total) by mouth every 8 (eight) hours as needed for nausea or vomiting.   No facility-administered encounter medications on file as of 09/01/2015.    Activities of Daily Living In your present state of health, do you have any difficulty performing the following activities: 09/01/2015 03/03/2015  Hearing? N N  Vision? N N  Difficulty concentrating or making decisions? N N  Walking or climbing stairs? N N  Dressing or bathing? N N  Doing errands, shopping? N N    Patient Care Team: Rosalita Chessman, DO as PCP - General (Family Medicine) Roxy Cedar. White, MD as Consulting Physician (Obstetrics and Gynecology)    Assessment:    cpe Exercise Activities and Dietary recommendations-- con't daily walking  Current Exercise Habits: Home exercise routine, Type of exercise: walking, Time (Minutes): 30, Frequency (Times/Week): 7, Weekly Exercise (Minutes/Week): 210, Intensity: Moderate  Goals    None     Fall Risk Fall Risk  09/01/2015 03/03/2015  Falls in the past year? No No   Depression Screen PHQ 2/9 Scores 09/01/2015 03/03/2015  PHQ - 2 Score 0 0     Cognitive Testing MMSE 30/30  Immunization History  Administered Date(s) Administered  . Influenza,inj,Quad PF,36+ Mos 03/03/2015  . Influenza-Unspecified 04/13/2014  . Pneumococcal-Unspecified 06/13/2013  . Zoster 06/14/2011   Screening Tests Health Maintenance  Topic Date Due  . TETANUS/TDAP  10/26/1965  . PNA vac Low Risk Adult (2 of 2 - PCV13) 06/13/2014  . INFLUENZA VACCINE  01/12/2016  . MAMMOGRAM  10/11/2016  . COLONOSCOPY  06/13/2021  . DEXA SCAN  Completed  . ZOSTAVAX  Completed  . Hepatitis C Screening  Completed      Plan:     see AVS During the course of the visit the patient was educated and counseled about the following appropriate screening and preventive services:   Vaccines to include Pneumoccal, Influenza, Hepatitis B, Td, Zostavax, HCV  Electrocardiogram  Cardiovascular Disease  Colorectal cancer screening  Bone density screening  Diabetes screening  Glaucoma screening  Mammography/PAP  Patient Instructions (the written plan) was given to the patient.  1. Preventative health care See above  2. Stress incontinence Per meds - Lipid panel - POCT urinalysis dipstick - CBC with Differential/Platelet - Comprehensive metabolic panel  3. Menopause   - Lipid panel - POCT urinalysis dipstick - CBC with Differential/Platelet - Comprehensive metabolic panel  4. Vitamin D deficiency   - Vitamin D 1,25 dihydroxy  5. Routine history and physical examination of adult    Garnet Koyanagi, DO  09/06/2015

## 2015-09-04 LAB — VITAMIN D 1,25 DIHYDROXY
VITAMIN D 1, 25 (OH) TOTAL: 69 pg/mL (ref 18–72)
VITAMIN D3 1, 25 (OH): 69 pg/mL

## 2015-09-08 ENCOUNTER — Encounter: Payer: Self-pay | Admitting: Family Medicine

## 2015-09-08 NOTE — Telephone Encounter (Signed)
We can give prevnar 13 this year and 23 next year

## 2015-09-09 NOTE — Telephone Encounter (Signed)
The below message is the order.     KP

## 2015-09-09 NOTE — Telephone Encounter (Signed)
Patient scheduled nurse visit for 09/10/15 for prevnar injection. Requesting orders

## 2015-09-10 ENCOUNTER — Ambulatory Visit (INDEPENDENT_AMBULATORY_CARE_PROVIDER_SITE_OTHER): Payer: Medicare HMO | Admitting: Behavioral Health

## 2015-09-10 ENCOUNTER — Telehealth: Payer: Self-pay | Admitting: Family Medicine

## 2015-09-10 DIAGNOSIS — Z23 Encounter for immunization: Secondary | ICD-10-CM

## 2015-09-10 NOTE — Telephone Encounter (Signed)
Pt dropped off document for PCP to view and have on file (Power of [place])

## 2015-09-10 NOTE — Progress Notes (Signed)
Pre visit review using our clinic review tool, if applicable. No additional management support is needed unless otherwise documented below in the visit note.  Patient in office today for Prevnar vaccine. IM given in Left Deltoid. Patient tolerated injection well.

## 2015-09-14 NOTE — Telephone Encounter (Signed)
Initialed paperwork returned from provider; forwarded to [facility] for scan/SLS 04/03

## 2015-09-15 ENCOUNTER — Encounter: Payer: Self-pay | Admitting: Family Medicine

## 2015-09-21 ENCOUNTER — Encounter: Payer: Self-pay | Admitting: Family Medicine

## 2015-09-22 ENCOUNTER — Ambulatory Visit: Payer: Medicare HMO | Admitting: Family Medicine

## 2015-09-29 ENCOUNTER — Encounter: Payer: Self-pay | Admitting: Family Medicine

## 2015-09-30 NOTE — Telephone Encounter (Signed)
Requesting Gabapentin, see message above.   Please advise     KP

## 2015-10-01 MED ORDER — GABAPENTIN 300 MG PO CAPS: 300.0000 mg | ORAL_CAPSULE | Freq: Every day | ORAL | Status: DC

## 2015-10-01 NOTE — Addendum Note (Signed)
Addended byDamita Dunnings D on: 10/01/2015 05:00 PM   Modules accepted: Orders

## 2015-10-01 NOTE — Telephone Encounter (Signed)
Rx sent 

## 2015-10-01 NOTE — Telephone Encounter (Signed)
Ok to refill 300 mg 1 po qhs,  #30  5 refills

## 2015-10-20 ENCOUNTER — Encounter: Payer: Self-pay | Admitting: Family Medicine

## 2015-10-20 ENCOUNTER — Ambulatory Visit (INDEPENDENT_AMBULATORY_CARE_PROVIDER_SITE_OTHER): Payer: Medicare HMO | Admitting: Family Medicine

## 2015-10-20 VITALS — BP 116/72 | HR 65 | Temp 99.1°F | Wt 134.6 lb

## 2015-10-20 DIAGNOSIS — J069 Acute upper respiratory infection, unspecified: Secondary | ICD-10-CM

## 2015-10-20 MED ORDER — METHYLPREDNISOLONE ACETATE 80 MG/ML IJ SUSP: 80.0000 mg | Freq: Once | INTRAMUSCULAR | Status: DC

## 2015-10-20 MED ORDER — AMOXICILLIN-POT CLAVULANATE 875-125 MG PO TABS: 1.0000 | ORAL_TABLET | Freq: Two times a day (BID) | ORAL | Status: DC

## 2015-10-20 MED ORDER — LEVOCETIRIZINE DIHYDROCHLORIDE 5 MG PO TABS: 5.0000 mg | ORAL_TABLET | Freq: Every evening | ORAL | Status: DC

## 2015-10-20 MED ORDER — FLUTICASONE PROPIONATE 50 MCG/ACT NA SUSP: 2.0000 | Freq: Every day | NASAL | Status: DC

## 2015-10-20 NOTE — Progress Notes (Signed)
Patient ID: Edrena Hanners, female    DOB: [birth year]  Age: 70 y.o. MRN: [id number]    Subjective:  Subjective HPI Shlonda Allery presents for congestion and sore throat since Friday.  Her husband has it to  No fever. Only clear drainage.  She is taking mucinex and Ibuprofen with no relief.  .Review of Systems  Constitutional: Positive for chills. Negative for fever, diaphoresis, appetite change, fatigue and unexpected weight change.  HENT: Positive for congestion, postnasal drip and rhinorrhea. Negative for sinus pressure.   Eyes: Negative for pain, redness and visual disturbance.  Respiratory: Negative for cough, chest tightness, shortness of breath and wheezing.   Cardiovascular: Negative for chest pain, palpitations and leg swelling.  Endocrine: Negative for cold intolerance, heat intolerance, polydipsia, polyphagia and polyuria.  Genitourinary: Negative for dysuria, frequency and difficulty urinating.  Allergic/Immunologic: Negative for environmental allergies.  Neurological: Negative for dizziness, light-headedness, numbness and headaches.    History Past Medical History  Diagnosis Date  . Hyperlipidemia   . Chicken pox   . Rheumatoid arthritis ([place])     She has past surgical history that includes Lipoma excision; Rhinoplasty; Septoplasty; Breast biopsy; Placement of breast implants; and Abdominoplasty.   Her family history includes Deep vein thrombosis in her father; Diabetes in her paternal grandmother; Gallbladder disease in her father; Heart attack in her father; Heart disease in her father; Hypertension in her brother and mother; Sudden death in her father.She reports that she has never smoked. She has never used smokeless tobacco. She reports that she drinks alcohol. She reports that she does not use illicit drugs.  Current Outpatient Prescriptions on File Prior to Visit  Medication Sig Dispense Refill  . estradiol (ESTRACE) 0.5 MG tablet Take 0.5 mg by mouth daily.    Marland Kitchen  gabapentin (NEURONTIN) 300 MG capsule Take 1 capsule (300 mg total) by mouth at bedtime. 30 capsule 5  . LATISSE 0.03 % ophthalmic solution     . meloxicam (MOBIC) 15 MG tablet Take 1 tablet (15 mg total) by mouth daily. 90 tablet 1  . progesterone (PROMETRIUM) 100 MG capsule Take 100 mg by mouth daily.    . traZODone (DESYREL) 50 MG tablet Take 1 tablet (50 mg total) by mouth at bedtime. 90 tablet 1   No current facility-administered medications on file prior to visit.     Objective:  Objective Physical Exam  Constitutional: She is oriented to person, place, and time. She appears well-developed and well-nourished.  HENT:  Head: Normocephalic and atraumatic.  Right Ear: External ear normal.  Left Ear: External ear normal.  + PND + errythema  Eyes: Conjunctivae and EOM are normal. Right eye exhibits no discharge. Left eye exhibits no discharge.  Neck: Normal range of motion. Neck supple. No JVD present. Carotid bruit is not present. No thyromegaly present.  Cardiovascular: Normal rate, regular rhythm and normal heart sounds.   No murmur heard. Pulmonary/Chest: Effort normal and breath sounds normal. No respiratory distress. She has no wheezes. She has no rales. She exhibits no tenderness.  Musculoskeletal: She exhibits no edema.  Lymphadenopathy:    She has cervical adenopathy.  Neurological: She is alert and oriented to person, place, and time.  Psychiatric: She has a normal mood and affect.  Nursing note and vitals reviewed.  BP 116/72 mmHg  Pulse 65  Temp(Src) 99.1 F (37.3 C) (Oral)  Wt 134 lb 9.6 oz (61.054 kg)  SpO2 96% Wt Readings from Last 3 Encounters:  10/20/15 134 lb 9.6 oz (61.054  kg)  09/01/15 133 lb (60.328 kg)  07/16/15 130 lb 3.2 oz (59.058 kg)     Lab Results  Component Value Date   WBC 4.3 09/01/2015   HGB 13.1 09/01/2015   HCT 38.3 09/01/2015   PLT 240.0 09/01/2015   GLUCOSE 92 09/01/2015   CHOL 240* 09/01/2015   TRIG 70.0 09/01/2015   HDL 67.70  09/01/2015   LDLCALC 158* 09/01/2015   ALT 25 09/01/2015   AST 25 09/01/2015   NA 139 09/01/2015   K 4.4 09/01/2015   CL 103 09/01/2015   CREATININE 0.67 09/01/2015   BUN 23 09/01/2015   CO2 32 09/01/2015    Patient was never admitted.   Assessment & Plan:  Plan I am having Ms. Zielke start on fluticasone, levocetirizine, and amoxicillin-clavulanate. I am also having her maintain her estradiol, progesterone, LATISSE, traZODone, meloxicam, and gabapentin. We will continue to administer methylPREDNISolone acetate.  Meds ordered this encounter  Medications  . fluticasone (FLONASE) 50 MCG/ACT nasal spray    Sig: Place 2 sprays into both nostrils daily.    Dispense:  16 g    Refill:  6  . levocetirizine (XYZAL) 5 MG tablet    Sig: Take 1 tablet (5 mg total) by mouth every evening.    Dispense:  30 tablet    Refill:  5  . amoxicillin-clavulanate (AUGMENTIN) 875-125 MG tablet    Sig: Take 1 tablet by mouth 2 (two) times daily.    Dispense:  20 tablet    Refill:  0  . methylPREDNISolone acetate (DEPO-MEDROL) injection 80 mg    Sig:     Problem List Items Addressed This Visit    None    Visit Diagnoses    Acute upper respiratory infection    -  Primary    Relevant Medications    fluticasone (FLONASE) 50 MCG/ACT nasal spray    levocetirizine (XYZAL) 5 MG tablet    methylPREDNISolone acetate (DEPO-MEDROL) injection 80 mg       Follow-up: Return if symptoms worsen or fail to improve.  Ann Held, DO

## 2015-10-20 NOTE — Progress Notes (Deleted)
  Subjective:     Tenaya Kraynik is a 70 y.o. female who presents for evaluation of sinus pain. Symptoms include: congestion, cough, foul rhinorrhea, headaches, nasal congestion, post nasal drip, sinus pressure and sore throat. Onset of symptoms was 5 days ago. Symptoms have been gradually worsening since that time. Past history is significant for no history of pneumonia or bronchitis. Patient is a non-smoker.  Taking mucinex DM and ibuprofen with no relief.    The following portions of the patient's history were reviewed and updated as appropriate:  She  has a past medical history of Hyperlipidemia; Chicken pox; and Rheumatoid arthritis ([place]). She  does not have any pertinent problems on file. She  has past surgical history that includes Lipoma excision; Rhinoplasty; Septoplasty; Breast biopsy; Placement of breast implants; and Abdominoplasty. Her family history includes Deep vein thrombosis in her father; Diabetes in her paternal grandmother; Gallbladder disease in her father; Heart attack in her father; Heart disease in her father; Hypertension in her brother and mother; Sudden death in her father. She  reports that she has never smoked. She has never used smokeless tobacco. She reports that she drinks alcohol. She reports that she does not use illicit drugs. She has a current medication list which includes the following prescription(s): estradiol, gabapentin, latisse, meloxicam, progesterone, and trazodone. Current Outpatient Prescriptions on File Prior to Visit  Medication Sig Dispense Refill  . estradiol (ESTRACE) 0.5 MG tablet Take 0.5 mg by mouth daily.    Marland Kitchen gabapentin (NEURONTIN) 300 MG capsule Take 1 capsule (300 mg total) by mouth at bedtime. 30 capsule 5  . LATISSE 0.03 % ophthalmic solution     . meloxicam (MOBIC) 15 MG tablet Take 1 tablet (15 mg total) by mouth daily. 90 tablet 1  . progesterone (PROMETRIUM) 100 MG capsule Take 100 mg by mouth daily.    . traZODone (DESYREL) 50 MG  tablet Take 1 tablet (50 mg total) by mouth at bedtime. 90 tablet 1   No current facility-administered medications on file prior to visit.   She has No Known Allergies..  Review of Systems Pertinent items are noted in HPI.   Objective:    BP 116/72 mmHg  Pulse 65  Temp(Src) 99.1 F (37.3 C) (Oral)  Wt 134 lb 9.6 oz (61.054 kg)  SpO2 96% General appearance: alert, cooperative, appears stated age and no distress Head: Normocephalic, without obvious abnormality, atraumatic Ears: normal TM's and external ear canals both ears Nose: yellow discharge, turbinates red, swollen, sinus tenderness bilateral Throat: abnormal findings: marked oropharyngeal erythema Neck: no adenopathy, supple, symmetrical, trachea midline and thyroid not enlarged, symmetric, no tenderness/mass/nodules Lungs: clear to auscultation bilaterally Heart: S1, S2 normal    Assessment:    Acute {bacterial?:15296} sinusitis.    Plan:    {Plan; [phone]}

## 2015-10-20 NOTE — Progress Notes (Signed)
Pre visit review using our clinic review tool, if applicable. No additional management support is needed unless otherwise documented below in the visit note. 

## 2015-10-20 NOTE — Patient Instructions (Signed)
Upper Respiratory Infection, Adult Most upper respiratory infections (URIs) are a viral infection of the air passages leading to the lungs. A URI affects the nose, throat, and upper air passages. The most common type of URI is nasopharyngitis and is typically referred to as "the common cold." URIs run their course and usually go away on their own. Most of the time, a URI does not require medical attention, but sometimes a bacterial infection in the upper airways can follow a viral infection. This is called a secondary infection. Sinus and middle ear infections are common types of secondary upper respiratory infections. Bacterial pneumonia can also complicate a URI. A URI can worsen asthma and chronic obstructive pulmonary disease (COPD). Sometimes, these complications can require emergency medical care and may be life threatening.  CAUSES Almost all URIs are caused by viruses. A virus is a type of germ and can spread from one person to another.  RISKS FACTORS You may be at risk for a URI if:   You smoke.   You have chronic heart or lung disease.  You have a weakened defense (immune) system.   You are very young or very old.   You have nasal allergies or asthma.  You work in crowded or poorly ventilated areas.  You work in health care facilities or schools. SIGNS AND SYMPTOMS  Symptoms typically develop 2-3 days after you come in contact with a cold virus. Most viral URIs last 7-10 days. However, viral URIs from the influenza virus (flu virus) can last 14-18 days and are typically more severe. Symptoms may include:   Runny or stuffy (congested) nose.   Sneezing.   Cough.   Sore throat.   Headache.   Fatigue.   Fever.   Loss of appetite.   Pain in your forehead, behind your eyes, and over your cheekbones (sinus pain).  Muscle aches.  DIAGNOSIS  Your health care provider may diagnose a URI by:  Physical exam.  Tests to check that your symptoms are not due to  another condition such as:  Strep throat.  Sinusitis.  Pneumonia.  Asthma. TREATMENT  A URI goes away on its own with time. It cannot be cured with medicines, but medicines may be prescribed or recommended to relieve symptoms. Medicines may help:  Reduce your fever.  Reduce your cough.  Relieve nasal congestion. HOME CARE INSTRUCTIONS   Take medicines only as directed by your health care provider.   Gargle warm saltwater or take cough drops to comfort your throat as directed by your health care provider.  Use a warm mist humidifier or inhale steam from a shower to increase air moisture. This may make it easier to breathe.  Drink enough fluid to keep your urine clear or pale yellow.   Eat soups and other clear broths and maintain good nutrition.   Rest as needed.   Return to work when your temperature has returned to normal or as your health care provider advises. You may need to stay home longer to avoid infecting others. You can also use a face mask and careful hand washing to prevent spread of the virus.  Increase the usage of your inhaler if you have asthma.   Do not use any tobacco products, including cigarettes, chewing tobacco, or electronic cigarettes. If you need help quitting, ask your health care provider. PREVENTION  The best way to protect yourself from getting a cold is to practice good hygiene.   Avoid oral or hand contact with people with cold   symptoms.   Wash your hands often if contact occurs.  There is no clear evidence that vitamin C, vitamin E, echinacea, or exercise reduces the chance of developing a cold. However, it is always recommended to get plenty of rest, exercise, and practice good nutrition.  SEEK MEDICAL CARE IF:   You are getting worse rather than better.   Your symptoms are not controlled by medicine.   You have chills.  You have worsening shortness of breath.  You have brown or red mucus.  You have yellow or brown nasal  discharge.  You have pain in your face, especially when you bend forward.  You have a fever.  You have swollen neck glands.  You have pain while swallowing.  You have white areas in the back of your throat. SEEK IMMEDIATE MEDICAL CARE IF:   You have severe or persistent:  Headache.  Ear pain.  Sinus pain.  Chest pain.  You have chronic lung disease and any of the following:  Wheezing.  Prolonged cough.  Coughing up blood.  A change in your usual mucus.  You have a stiff neck.  You have changes in your:  Vision.  Hearing.  Thinking.  Mood. MAKE SURE YOU:   Understand these instructions.  Will watch your condition.  Will get help right away if you are not doing well or get worse.   This information is not intended to replace advice given to you by your health care provider. Make sure you discuss any questions you have with your health care provider.   Document Released: 11/23/2000 Document Revised: 10/14/2014 Document Reviewed: 09/04/2013 Elsevier Interactive Patient Education 2016 [employer] Inc[employer]  

## 2015-11-11 ENCOUNTER — Encounter: Payer: Self-pay | Admitting: Family Medicine

## 2015-11-21 ENCOUNTER — Encounter: Payer: Self-pay | Admitting: Family Medicine

## 2015-11-24 ENCOUNTER — Encounter: Payer: Self-pay | Admitting: Family Medicine

## 2015-12-22 DIAGNOSIS — F5101 Primary insomnia: Secondary | ICD-10-CM

## 2015-12-22 HISTORY — DX: Primary insomnia: F51.01

## 2016-01-11 ENCOUNTER — Encounter: Payer: Self-pay | Admitting: Family Medicine

## 2016-01-11 MED ORDER — MELOXICAM 15 MG PO TABS: 15.0000 mg | ORAL_TABLET | Freq: Every day | ORAL | 1 refills | Status: DC

## 2016-01-11 NOTE — Telephone Encounter (Signed)
Meloxicam requested  Last seen 10/20/15 and filled 07/06/15 #90 with 1   Please advise     KP

## 2016-11-11 ENCOUNTER — Encounter: Payer: Self-pay | Admitting: Family Medicine

## 2016-11-11 ENCOUNTER — Ambulatory Visit (INDEPENDENT_AMBULATORY_CARE_PROVIDER_SITE_OTHER): Payer: Medicare HMO | Admitting: Family Medicine

## 2016-11-11 VITALS — BP 116/66 | HR 64 | Temp 98.9°F | Resp 16 | Ht 60.0 in | Wt 132.8 lb

## 2016-11-11 DIAGNOSIS — R5383 Other fatigue: Secondary | ICD-10-CM

## 2016-11-11 DIAGNOSIS — R8290 Unspecified abnormal findings in urine: Secondary | ICD-10-CM | POA: Diagnosis not present

## 2016-11-11 HISTORY — DX: Other fatigue: R53.83

## 2016-11-11 LAB — T3, FREE: T3 FREE: 2.7 pg/mL (ref 2.3–4.2)

## 2016-11-11 LAB — CBC WITH DIFFERENTIAL/PLATELET
BASOS PCT: 0.6 % (ref 0.0–3.0)
Basophils Absolute: 0 10*3/uL (ref 0.0–0.1)
EOS ABS: 0.1 10*3/uL (ref 0.0–0.7)
Eosinophils Relative: 1.1 % (ref 0.0–5.0)
HEMATOCRIT: 41 % (ref 36.0–46.0)
Hemoglobin: 13.9 g/dL (ref 12.0–15.0)
LYMPHS ABS: 1 10*3/uL (ref 0.7–4.0)
LYMPHS PCT: 20.2 % (ref 12.0–46.0)
MCHC: 34 g/dL (ref 30.0–36.0)
MCV: 95.6 fl (ref 78.0–100.0)
Monocytes Absolute: 0.4 10*3/uL (ref 0.1–1.0)
Monocytes Relative: 8.6 % (ref 3.0–12.0)
NEUTROS ABS: 3.6 10*3/uL (ref 1.4–7.7)
NEUTROS PCT: 69.5 % (ref 43.0–77.0)
PLATELETS: 247 10*3/uL (ref 150.0–400.0)
RBC: 4.28 Mil/uL (ref 3.87–5.11)
RDW: 12.2 % (ref 11.5–15.5)
WBC: 5.1 10*3/uL (ref 4.0–10.5)

## 2016-11-11 LAB — LIPID PANEL
CHOL/HDL RATIO: 3
Cholesterol: 229 mg/dL — ABNORMAL HIGH (ref 0–200)
HDL: 65.9 mg/dL (ref >39.00)
LDL Cholesterol: 141 mg/dL — ABNORMAL HIGH (ref 0–99)
NONHDL: 162.79
TRIGLYCERIDES: 111 mg/dL (ref 0.0–149.0)
VLDL: 22.2 mg/dL (ref 0.0–40.0)

## 2016-11-11 LAB — POCT URINALYSIS DIPSTICK
Bilirubin, UA: NEGATIVE
Glucose, UA: NEGATIVE
LEUKOCYTES UA: NEGATIVE
NITRITE UA: NEGATIVE
PH UA: 6.5 (ref 5.0–8.0)
Protein, UA: NEGATIVE
RBC UA: NEGATIVE
Spec Grav, UA: 1.02 (ref 1.010–1.025)
UROBILINOGEN UA: 0.2 U/dL

## 2016-11-11 LAB — TSH: TSH: 1.63 u[IU]/mL (ref 0.35–4.50)

## 2016-11-11 LAB — VITAMIN B12: Vitamin B-12: 683 pg/mL (ref 211–911)

## 2016-11-11 LAB — T4, FREE: FREE T4: 0.85 ng/dL (ref 0.60–1.60)

## 2016-11-11 NOTE — Patient Instructions (Signed)

## 2016-11-11 NOTE — Assessment & Plan Note (Signed)
?   Etiology Pt with hx of RA but has not seen rheum in a few years--- she could not tolerate meds and did not go back Check labs-- if all is neg consider RA as reason-- she may need to go back to Rheum

## 2016-11-11 NOTE — Progress Notes (Signed)
Patient ID: Stephanie Lara, female   DOB: December 15, 1945, 71 y.o.   MRN: [id number]     Subjective:  I acted as a Education administrator for Dr. Carollee Herter.  Guerry Bruin, [place]   Patient ID: Stephanie Lara, female    DOB: 28-Feb-1946, 71 y.o.   MRN: [id number]  Chief Complaint  Patient presents with  . Fatigue    HPI  Patient is in today for fatigue.  Patient has been feeling this way since the beginning of May.  She also aches in both legs all month.  Pt describes extreme exhaustion like flu.  No fevers .  Pt is sleeping great!  No snoring or restlessness.   No cp, no sob, no palpitations  Patient Care Team: Carollee Herter, Alferd Apa, DO as PCP - General (Family Medicine) Roque Cash., MD as Consulting Physician (Obstetrics and Gynecology)   Past Medical History:  Diagnosis Date  . Chicken pox   . Hyperlipidemia   . Rheumatoid arthritis [facility])     Past Surgical History:  Procedure Laterality Date  . ABDOMINOPLASTY    . BREAST BIOPSY    . LIPOMA EXCISION     neck  . PLACEMENT OF BREAST IMPLANTS    . RHINOPLASTY    . SEPTOPLASTY      Family History  Problem Relation Age of Onset  . Hypertension Mother   . Heart disease Father   . Sudden death Father   . Heart attack Father   . Gallbladder disease Father   . Deep vein thrombosis Father   . Diabetes Paternal Grandmother   . Hypertension Brother     Social History   Social History  . Marital status: Married    Spouse name: N/A  . Number of children: N/A  . Years of education: N/A   Occupational History  . retired Teacher, 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[employer]    Social History Main Topics  . Smoking status: Never Smoker  . Smokeless tobacco: Never Used  . Alcohol use 0.0 oz/week     Comment: Occ  . Drug use: No  . Sexual activity: Yes    Partners: Male   Other Topics Concern  . Not on file   Social History Narrative  . No narrative on file    Outpatient Medications Prior to Visit  Medication Sig Dispense Refill  . estradiol (ESTRACE) 0.5 MG  tablet Take 0.5 mg by mouth daily.    . fluticasone (FLONASE) 50 MCG/ACT nasal spray Place 2 sprays into both nostrils daily. 16 g 6  . LATISSE 0.03 % ophthalmic solution     . meloxicam (MOBIC) 15 MG tablet Take 1 tablet (15 mg total) by mouth daily. 90 tablet 1  . progesterone (PROMETRIUM) 100 MG capsule Take 100 mg by mouth daily.    . traZODone (DESYREL) 50 MG tablet Take 1 tablet (50 mg total) by mouth at bedtime. 90 tablet 1  . amoxicillin-clavulanate (AUGMENTIN) 875-125 MG tablet Take 1 tablet by mouth 2 (two) times daily. 20 tablet 0  . gabapentin (NEURONTIN) 300 MG capsule Take 1 capsule (300 mg total) by mouth at bedtime. 30 capsule 5  . levocetirizine (XYZAL) 5 MG tablet Take 1 tablet (5 mg total) by mouth every evening. 30 tablet 5  . methylPREDNISolone acetate (DEPO-MEDROL) injection 80 mg      No facility-administered medications prior to visit.     No Known Allergies  Review of Systems  Constitutional: Positive for malaise/fatigue. Negative for fever.  HENT: Negative for congestion.   Eyes:  Negative for blurred vision.  Respiratory: Negative for cough and shortness of breath.   Cardiovascular: Negative for chest pain, palpitations and leg swelling.  Gastrointestinal: Negative for vomiting.  Musculoskeletal: Negative for back pain.       Aches in both legs   Skin: Negative for rash.  Neurological: Negative for loss of consciousness and headaches.       Objective:    Physical Exam  Constitutional: She is oriented to person, place, and time. She appears well-developed and well-nourished. No distress.  HENT:  Head: Normocephalic and atraumatic.  Eyes: Conjunctivae and EOM are normal.  Neck: Normal range of motion. Neck supple. No JVD present. Carotid bruit is not present. No thyromegaly present.  Cardiovascular: Normal rate, regular rhythm and normal heart sounds.   No murmur heard. Pulmonary/Chest: Effort normal and breath sounds normal. No respiratory distress.  She has no wheezes. She has no rales. She exhibits no tenderness.  Abdominal: Soft. Bowel sounds are normal. There is no tenderness.  Musculoskeletal: Normal range of motion. She exhibits no edema or deformity.  Neurological: She is alert and oriented to person, place, and time.  Skin: Skin is warm and dry. She is not diaphoretic.  Psychiatric: She has a normal mood and affect. Her behavior is normal. Judgment and thought content normal.  Nursing note and vitals reviewed.   BP 116/66 (BP Location: Left Arm, Cuff Size: Normal)   Pulse 64   Temp 98.9 F (37.2 C) (Oral)   Resp 16   Ht 5' (1.524 m)   Wt 132 lb 12.8 oz (60.2 kg)   SpO2 97%   BMI 25.94 kg/m  Wt Readings from Last 3 Encounters:  11/11/16 132 lb 12.8 oz (60.2 kg)  10/20/15 134 lb 9.6 oz (61.1 kg)  09/01/15 133 lb (60.3 kg)   BP Readings from Last 3 Encounters:  11/11/16 116/66  10/20/15 116/72  09/01/15 124/85     Immunization History  Administered Date(s) Administered  . Influenza,inj,Quad PF,36+ Mos 03/03/2015  . Influenza-Unspecified 04/13/2014  . Pneumococcal Conjugate-13 09/10/2015  . Pneumococcal-Unspecified 06/13/2013  . Zoster 06/14/2011    Health Maintenance  Topic Date Due  . Samul Dada  10/26/1965  . INFLUENZA VACCINE  01/11/2017  . MAMMOGRAM  10/01/2017  . COLONOSCOPY  06/13/2021  . DEXA SCAN  Completed  . Hepatitis C Screening  Completed  . PNA vac Low Risk Adult  Completed    Lab Results  Component Value Date   WBC 5.1 11/11/2016   HGB 13.9 11/11/2016   HCT 41.0 11/11/2016   PLT 247.0 11/11/2016   GLUCOSE 92 09/01/2015   CHOL 229 (H) 11/11/2016   TRIG 111.0 11/11/2016   HDL 65.90 11/11/2016   LDLCALC 141 (H) 11/11/2016   ALT 25 09/01/2015   AST 25 09/01/2015   NA 139 09/01/2015   K 4.4 09/01/2015   CL 103 09/01/2015   CREATININE 0.67 09/01/2015   BUN 23 09/01/2015   CO2 32 09/01/2015   TSH 1.63 11/11/2016    Lab Results  Component Value Date   TSH 1.63 11/11/2016    Lab Results  Component Value Date   WBC 5.1 11/11/2016   HGB 13.9 11/11/2016   HCT 41.0 11/11/2016   MCV 95.6 11/11/2016   PLT 247.0 11/11/2016   Lab Results  Component Value Date   NA 139 09/01/2015   K 4.4 09/01/2015   CO2 32 09/01/2015   GLUCOSE 92 09/01/2015   BUN 23 09/01/2015   CREATININE 0.67 09/01/2015  BILITOT 1.3 (H) 09/01/2015   ALKPHOS 36 (L) 09/01/2015   AST 25 09/01/2015   ALT 25 09/01/2015   PROT 6.6 09/01/2015   ALBUMIN 4.2 09/01/2015   CALCIUM 9.6 09/01/2015   GFR 92.80 09/01/2015   Lab Results  Component Value Date   CHOL 229 (H) 11/11/2016   Lab Results  Component Value Date   HDL 65.90 11/11/2016   Lab Results  Component Value Date   LDLCALC 141 (H) 11/11/2016   Lab Results  Component Value Date   TRIG 111.0 11/11/2016   Lab Results  Component Value Date   CHOLHDL 3 11/11/2016   No results found for: HGBA1C    ekg-- sinus brady   Assessment & Plan:   Problem List Items Addressed This Visit      Unprioritized   Fatigue - Primary    ? Etiology Pt with hx of RA but has not seen rheum in a few years--- she could not tolerate meds and did not go back Check labs-- if all is neg consider RA as reason-- she may need to go back to Rheum      Relevant Orders   CBC with Differential/Platelet (Completed)   Lipid panel (Completed)   POCT urinalysis dipstick (Completed)   TSH (Completed)   Vitamin D 1,25 dihydroxy   T3, free (Completed)   T4, free (Completed)   Vitamin B12 (Completed)   EKG 12-Lead (Completed)   Urine Culture    Other Visit Diagnoses    Abnormal urine       Relevant Orders   Urine Culture      I have discontinued Ms. Ferns's gabapentin, levocetirizine, and amoxicillin-clavulanate. I am also having her maintain her estradiol, progesterone, LATISSE, traZODone, fluticasone, meloxicam, Turmeric Curcumin, Calcium Carbonate-Vitamin D (CALCIUM 500 + D PO), B Complex-C (SUPER B COMPLEX PO), Bromelains, VITAMIN E  PO, Coenzyme Q10 (CO Q 10 PO), vitamin C, Magnesium, Omega-3 Fatty Acids (FISH OIL PO), Red Yeast Rice, and Mag Aspart-Potassium Aspart (POTASSIUM & MAGNESIUM ASPARTAT PO). We will stop administering methylPREDNISolone acetate.  Meds ordered this encounter  Medications  . Turmeric Curcumin 500 MG CAPS    Sig: Take 1 capsule by mouth daily.  . Calcium Carbonate-Vitamin D (CALCIUM 500 + D PO)    Sig: Take 1 capsule by mouth daily.  . B Complex-C (SUPER B COMPLEX PO)    Sig: Take 1 tablet by mouth daily.  . Bromelains 500 MG TABS    Sig: Take 1 tablet by mouth 2 (two) times daily.  Marland Kitchen VITAMIN E PO    Sig: Take 450 mg by mouth 2 (two) times daily.  . Coenzyme Q10 (CO Q 10 PO)    Sig: Take 200 mg by mouth.  . Ascorbic Acid (VITAMIN C) 1000 MG tablet    Sig: Take 1,000 mg by mouth daily.  . Magnesium 300 MG CAPS    Sig: Take 1 capsule by mouth daily.  . Omega-3 Fatty Acids (FISH OIL PO)    Sig: Take 300 mg by mouth 2 (two) times daily.  . Red Yeast Rice 600 MG CAPS    Sig: Take by mouth.  . Mag Aspart-Potassium Aspart (POTASSIUM & MAGNESIUM ASPARTAT PO)    Sig: Take 250 mg by mouth daily.    CMA served as Education administrator during this visit. History, Physical and Plan performed by medical provider. Documentation and orders reviewed and attested to.  Ann Held, DO

## 2016-11-12 LAB — URINE CULTURE

## 2016-11-14 LAB — VITAMIN D 1,25 DIHYDROXY
VITAMIN D 1, 25 (OH) TOTAL: 57 pg/mL (ref 18–72)
Vitamin D2 1, 25 (OH)2: <8 pg/mL
Vitamin D3 1, 25 (OH)2: 57 pg/mL

## 2016-12-23 LAB — HM PAP SMEAR: HM Pap smear: NORMAL

## 2017-01-09 ENCOUNTER — Ambulatory Visit (INDEPENDENT_AMBULATORY_CARE_PROVIDER_SITE_OTHER): Payer: Medicare HMO | Admitting: Family Medicine

## 2017-01-09 ENCOUNTER — Encounter: Payer: Self-pay | Admitting: Family Medicine

## 2017-01-09 VITALS — BP 120/80 | HR 72 | Temp 98.1°F | Ht 59.0 in | Wt 133.5 lb

## 2017-01-09 DIAGNOSIS — Z0000 Encounter for general adult medical examination without abnormal findings: Secondary | ICD-10-CM | POA: Diagnosis not present

## 2017-01-09 DIAGNOSIS — M069 Rheumatoid arthritis, unspecified: Secondary | ICD-10-CM

## 2017-01-09 HISTORY — DX: Encounter for general adult medical examination without abnormal findings: Z00.00

## 2017-01-09 MED ORDER — MELOXICAM 15 MG PO TABS: 15.0000 mg | ORAL_TABLET | Freq: Every day | ORAL | 1 refills | Status: DC

## 2017-01-09 NOTE — Progress Notes (Signed)
Pre visit review using our clinic review tool, if applicable. No additional management support is needed unless otherwise documented below in the visit note. 

## 2017-01-09 NOTE — Patient Instructions (Signed)
Preventive Care 70 Years and Older, Female Preventive care refers to lifestyle choices and visits with your health care provider that can promote health and wellness. What does preventive care include?  A yearly physical exam. This is also called an annual well check.  Dental exams once or twice a year.  Routine eye exams. Ask your health care provider how often you should have your eyes checked.  Personal lifestyle choices, including: ? Daily care of your teeth and gums. ? Regular physical activity. ? Eating a healthy diet. ? Avoiding tobacco and drug use. ? Limiting alcohol use. ? Practicing safe sex. ? Taking low-dose aspirin every day. ? Taking vitamin and mineral supplements as recommended by your health care provider. What happens during an annual well check? The services and screenings done by your health care provider during your annual well check will depend on your age, overall health, lifestyle risk factors, and family history of disease. Counseling Your health care provider may ask you questions about your:  Alcohol use.  Tobacco use.  Drug use.  Emotional well-being.  Home and relationship well-being.  Sexual activity.  Eating habits.  History of falls.  Memory and ability to understand (cognition).  Work and work environment.  Reproductive health.  Screening You may have the following tests or measurements:  Height, weight, and BMI.  Blood pressure.  Lipid and cholesterol levels. These may be checked every 5 years, or more frequently if you are over 50 years old.  Skin check.  Lung cancer screening. You may have this screening every year starting at age 55 if you have a 30-pack-year history of smoking and currently smoke or have quit within the past 15 years.  Fecal occult blood test (FOBT) of the stool. You may have this test every year starting at age 50.  Flexible sigmoidoscopy or colonoscopy. You may have a sigmoidoscopy every 5 years or  a colonoscopy every 10 years starting at age 50.  Hepatitis C blood test.  Hepatitis B blood test.  Sexually transmitted disease (STD) testing.  Diabetes screening. This is done by checking your blood sugar (glucose) after you have not eaten for a while (fasting). You may have this done every 1-3 years.  Bone density scan. This is done to screen for osteoporosis. You may have this done starting at age 65.  Mammogram. This may be done every 1-2 years. Talk to your health care provider about how often you should have regular mammograms.  Talk with your health care provider about your test results, treatment options, and if necessary, the need for more tests. Vaccines Your health care provider may recommend certain vaccines, such as:  Influenza vaccine. This is recommended every year.  Tetanus, diphtheria, and acellular pertussis (Tdap, Td) vaccine. You may need a Td booster every 10 years.  Varicella vaccine. You may need this if you have not been vaccinated.  Zoster vaccine. You may need this after age 60.  Measles, mumps, and rubella (MMR) vaccine. You may need at least one dose of MMR if you were born in 1957 or later. You may also need a second dose.  Pneumococcal 13-valent conjugate (PCV13) vaccine. One dose is recommended after age 65.  Pneumococcal polysaccharide (PPSV23) vaccine. One dose is recommended after age 65.  Meningococcal vaccine. You may need this if you have certain conditions.  Hepatitis A vaccine. You may need this if you have certain conditions or if you travel or work in places where you may be exposed to hepatitis   A.  Hepatitis B vaccine. You may need this if you have certain conditions or if you travel or work in places where you may be exposed to hepatitis B.  Haemophilus influenzae type b (Hib) vaccine. You may need this if you have certain conditions.  Talk to your health care provider about which screenings and vaccines you need and how often you  need them. This information is not intended to replace advice given to you by your health care provider. Make sure you discuss any questions you have with your health care provider. Document Released: 06/26/2015 Document Revised: 02/17/2016 Document Reviewed: 03/31/2015 Elsevier Interactive Patient Education  2017 [employer].

## 2017-01-09 NOTE — Progress Notes (Signed)
Patient ID: Stephanie Lara, female    DOB: Mar 12, 1946  Age: 71 y.o. MRN: [id number]    Subjective:  Subjective  HPI Stephanie Lara presents for cpe no pap  Review of Systems  Constitutional: Negative for chills and fever.  HENT: Negative for congestion and hearing loss.   Eyes: Negative for discharge.  Respiratory: Negative for cough and shortness of breath.   Cardiovascular: Negative for chest pain, palpitations and leg swelling.  Gastrointestinal: Negative for abdominal pain, blood in stool, constipation, diarrhea, nausea and vomiting.  Genitourinary: Negative for dysuria, frequency, hematuria and urgency.  Musculoskeletal: Negative for back pain and myalgias.  Skin: Negative for rash.  Allergic/Immunologic: Negative for environmental allergies.  Neurological: Negative for dizziness, weakness and headaches.  Hematological: Does not bruise/bleed easily.  Psychiatric/Behavioral: Negative for suicidal ideas. The patient is not nervous/anxious.     History Past Medical History:  Diagnosis Date  . Chicken pox   . Hyperlipidemia   . Rheumatoid arthritis ([place])     She has a past surgical history that includes Lipoma excision; Rhinoplasty; Septoplasty; Breast biopsy; Placement of breast implants; and Abdominoplasty.   Her family history includes Deep vein thrombosis in her father; Diabetes in her paternal grandmother; Gallbladder disease in her father; Heart attack in her father; Heart disease in her father; Hypertension in her brother and mother; Sudden death in her father.She reports that she has never smoked. She has never used smokeless tobacco. She reports that she drinks alcohol. She reports that she does not use drugs.  Current Outpatient Prescriptions on File Prior to Visit  Medication Sig Dispense Refill  . Ascorbic Acid (VITAMIN C) 1000 MG tablet Take 1,000 mg by mouth daily.    . B Complex-C (SUPER B COMPLEX PO) Take 1 tablet by mouth daily.    . Bromelains 500 MG TABS  Take 1 tablet by mouth 2 (two) times daily.    . Calcium Carbonate-Vitamin D (CALCIUM 500 + D PO) Take 1 capsule by mouth daily.    . Coenzyme Q10 (CO Q 10 PO) Take 200 mg by mouth.    . estradiol (ESTRACE) 0.5 MG tablet Take 0.5 mg by mouth daily.    . fluticasone (FLONASE) 50 MCG/ACT nasal spray Place 2 sprays into both nostrils daily. 16 g 6  . LATISSE 0.03 % ophthalmic solution     . Mag Aspart-Potassium Aspart (POTASSIUM & MAGNESIUM ASPARTAT PO) Take 250 mg by mouth daily.    . Magnesium 300 MG CAPS Take 1 capsule by mouth daily.    . Omega-3 Fatty Acids (FISH OIL PO) Take 300 mg by mouth 2 (two) times daily.    . progesterone (PROMETRIUM) 100 MG capsule Take 100 mg by mouth daily.    . Red Yeast Rice 600 MG CAPS Take by mouth.    . traZODone (DESYREL) 50 MG tablet Take 1 tablet (50 mg total) by mouth at bedtime. 90 tablet 1  . Turmeric Curcumin 500 MG CAPS Take 1 capsule by mouth daily.    Marland Kitchen VITAMIN E PO Take 450 mg by mouth 2 (two) times daily.     No current facility-administered medications on file prior to visit.      Objective:  Objective  Physical Exam  Constitutional: She is oriented to person, place, and time. She appears well-developed and well-nourished. No distress.  HENT:  Head: Normocephalic and atraumatic.  Right Ear: External ear normal.  Left Ear: External ear normal.  Nose: Nose normal.  Mouth/Throat: Oropharynx is clear and  moist.  Eyes: Pupils are equal, round, and reactive to light. Conjunctivae and EOM are normal. Right eye exhibits no discharge. Left eye exhibits no discharge.  Neck: Normal range of motion. Neck supple. No JVD present. No thyromegaly present.  Cardiovascular: Normal rate, regular rhythm, normal heart sounds and intact distal pulses.   No murmur heard. Pulmonary/Chest: Effort normal and breath sounds normal. No respiratory distress. She has no wheezes. She has no rales. She exhibits no tenderness.  Abdominal: Soft. Bowel sounds are normal.  She exhibits no distension and no mass. There is no tenderness. There is no rebound and no guarding.  Musculoskeletal: Normal range of motion. She exhibits no edema or tenderness.  Lymphadenopathy:    She has no cervical adenopathy.  Neurological: She is alert and oriented to person, place, and time. She has normal reflexes. No cranial nerve deficit.  Skin: Skin is warm and dry. No rash noted. She is not diaphoretic. No erythema.  Psychiatric: She has a normal mood and affect. Her behavior is normal. Judgment and thought content normal.  Nursing note and vitals reviewed.  BP 120/80 (BP Location: Left Arm, Patient Position: Sitting, Cuff Size: Normal)   Pulse 72   Temp 98.1 F (36.7 C) (Oral)   Ht 4\' 11"  (1.499 m)   Wt 133 lb 8 oz (60.6 kg)   SpO2 98%   BMI 26.96 kg/m  Wt Readings from Last 3 Encounters:  01/09/17 133 lb 8 oz (60.6 kg)  11/11/16 132 lb 12.8 oz (60.2 kg)  10/20/15 134 lb 9.6 oz (61.1 kg)     Lab Results  Component Value Date   WBC 5.1 11/11/2016   HGB 13.9 11/11/2016   HCT 41.0 11/11/2016   PLT 247.0 11/11/2016   GLUCOSE 92 09/01/2015   CHOL 229 (H) 11/11/2016   TRIG 111.0 11/11/2016   HDL 65.90 11/11/2016   LDLCALC 141 (H) 11/11/2016   ALT 25 09/01/2015   AST 25 09/01/2015   NA 139 09/01/2015   K 4.4 09/01/2015   CL 103 09/01/2015   CREATININE 0.67 09/01/2015   BUN 23 09/01/2015   CO2 32 09/01/2015   TSH 1.63 11/11/2016    Patient was never admitted.   Assessment & Plan:  Plan  I am having Stephanie Lara maintain her estradiol, progesterone, LATISSE, traZODone, fluticasone, Turmeric Curcumin, Calcium Carbonate-Vitamin D (CALCIUM 500 + D PO), B Complex-C (SUPER B COMPLEX PO), Bromelains, VITAMIN E PO, Coenzyme Q10 (CO Q 10 PO), vitamin C, Magnesium, Omega-3 Fatty Acids (FISH OIL PO), Red Yeast Rice, Mag Aspart-Potassium Aspart (POTASSIUM & MAGNESIUM ASPARTAT PO), and meloxicam.  Meds ordered this encounter  Medications  . meloxicam (MOBIC) 15 MG  tablet    Sig: Take 1 tablet (15 mg total) by mouth daily.    Dispense:  90 tablet    Refill:  1    Problem List Items Addressed This Visit      Unprioritized   Rheumatoid arthritis ([place])   Relevant Medications   meloxicam (MOBIC) 15 MG tablet   Preventative health care - Primary    ghm utd Check labs See avs          Follow-up: Return if symptoms worsen or fail to improve.  Ann Held, DO

## 2017-01-09 NOTE — Assessment & Plan Note (Signed)
ghm utd Check labs  See avs  

## 2017-01-11 ENCOUNTER — Telehealth: Payer: Self-pay | Admitting: *Deleted

## 2017-01-11 NOTE — Telephone Encounter (Signed)
Received results from OB/GYN per patient's request, forwarded to provider/SLS 08/01

## 2017-02-01 ENCOUNTER — Encounter: Payer: Self-pay | Admitting: Family Medicine

## 2017-05-29 ENCOUNTER — Encounter: Payer: Self-pay | Admitting: *Deleted

## 2017-06-27 ENCOUNTER — Encounter: Payer: Self-pay | Admitting: Family Medicine

## 2017-06-27 ENCOUNTER — Ambulatory Visit (INDEPENDENT_AMBULATORY_CARE_PROVIDER_SITE_OTHER): Payer: Medicare HMO | Admitting: Family Medicine

## 2017-06-27 ENCOUNTER — Telehealth: Payer: Self-pay

## 2017-06-27 VITALS — BP 122/60 | HR 73 | Temp 98.7°F | Resp 16 | Ht 59.0 in | Wt 138.0 lb

## 2017-06-27 DIAGNOSIS — G4452 New daily persistent headache (NDPH): Secondary | ICD-10-CM | POA: Diagnosis not present

## 2017-06-27 MED ORDER — CYCLOBENZAPRINE HCL 10 MG PO TABS: 10.0000 mg | ORAL_TABLET | Freq: Three times a day (TID) | ORAL | 0 refills | Status: DC | PRN

## 2017-06-27 NOTE — Progress Notes (Signed)
Patient ID: Stephanie Lara, female   DOB: May 02, 1946, 72 y.o.   MRN: [id number]    Subjective:  I acted as a Education administrator for Dr. Carollee Herter.  Guerry Bruin, [place]   Patient ID: Stephanie Lara, female    DOB: 1945-12-13, 72 y.o.   MRN: [id number]  Chief Complaint  Patient presents with  . Headache  . Neck Pain    Headache   This is a new problem. Episode onset: 10 days ago. The problem occurs intermittently. Pain location: starts from back of neck and goes to the fron. The quality of the pain is described as aching. Associated symptoms include neck pain. Pertinent negatives include no back pain, blurred vision, coughing, dizziness, eye pain, eye redness, eye watering, fever, loss of balance, nausea, phonophobia, sinus pressure or vomiting. She has tried NSAIDs and acetaminophen (aspirin) for the symptoms. The treatment provided mild relief.    Patient is in today for headache and neck pain.  Pt states after she wakes up she develops a neck ache on the right that eventually goes to both sides of the head-- she has tried asa , tylenol and IB -- it works until it wears off.  Vision is ok -- had her vision checked in May/ June--- she had beginning cataracts.    Pain feels the pain is the same as when she had MRI neck  Pain also over last 2 days spread to R jaw.      Patient Care Team: Carollee Herter, Alferd Apa, DO as PCP - General (Family Medicine) Roque Cash., MD as Consulting Physician (Obstetrics and Gynecology)   Past Medical History:  Diagnosis Date  . Chicken pox   . Hyperlipidemia   . Rheumatoid arthritis [facility])     Past Surgical History:  Procedure Laterality Date  . ABDOMINOPLASTY    . BREAST BIOPSY    . LIPOMA EXCISION     neck  . PLACEMENT OF BREAST IMPLANTS    . RHINOPLASTY    . SEPTOPLASTY      Family History  Problem Relation Age of Onset  . Hypertension Mother   . Heart disease Father   . Sudden death Father   . Heart attack Father   . Gallbladder disease Father   . Deep  vein thrombosis Father   . Diabetes Paternal Grandmother   . Hypertension Brother     Social History   Socioeconomic History  . Marital status: Married    Spouse name: Not on file  . Number of children: Not on file  . Years of education: Not on file  . Highest education level: Not on file  Social Needs  . Financial resource strain: Not on file  . Food insecurity - worry: Not on file  . Food insecurity - inability: Not on file  . Transportation needs - medical: Not on file  . Transportation needs - non-medical: Not on file  Occupational History  . Occupation: retired Teacher, 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[employer]  Tobacco Use  . Smoking status: Never Smoker  . Smokeless tobacco: Never Used  Substance and Sexual Activity  . Alcohol use: Yes    Alcohol/week: 0.0 oz    Comment: Occ  . Drug use: No  . Sexual activity: Yes    Partners: Male  Other Topics Concern  . Not on file  Social History Narrative  . Not on file    Outpatient Medications Prior to Visit  Medication Sig Dispense Refill  . Ascorbic Acid (VITAMIN C) 1000 MG tablet Take 1,000 mg  by mouth daily.    . B Complex-C (SUPER B COMPLEX PO) Take 1 tablet by mouth daily.    . Bromelains 500 MG TABS Take 1 tablet by mouth 2 (two) times daily.    . Calcium Carbonate-Vitamin D (CALCIUM 500 + D PO) Take 1 capsule by mouth daily.    . Coenzyme Q10 (CO Q 10 PO) Take 200 mg by mouth.    . estradiol (ESTRACE) 0.5 MG tablet Take 0.5 mg by mouth daily.    . fluticasone (FLONASE) 50 MCG/ACT nasal spray Place 2 sprays into both nostrils daily. 16 g 6  . LATISSE 0.03 % ophthalmic solution     . Mag Aspart-Potassium Aspart (POTASSIUM & MAGNESIUM ASPARTAT PO) Take 250 mg by mouth daily.    . Magnesium 300 MG CAPS Take 1 capsule by mouth daily.    . meloxicam (MOBIC) 15 MG tablet Take 1 tablet (15 mg total) by mouth daily. 90 tablet 1  . Omega-3 Fatty Acids (FISH OIL PO) Take 300 mg by mouth 2 (two) times daily.    . progesterone (PROMETRIUM) 100 MG  capsule Take 100 mg by mouth daily.    . Red Yeast Rice 600 MG CAPS Take by mouth.    . traZODone (DESYREL) 50 MG tablet Take 1 tablet (50 mg total) by mouth at bedtime. 90 tablet 1  . Turmeric Curcumin 500 MG CAPS Take 1 capsule by mouth daily.    Marland Kitchen VITAMIN E PO Take 450 mg by mouth 2 (two) times daily.     No facility-administered medications prior to visit.     No Known Allergies  Review of Systems  Constitutional: Negative for fever and malaise/fatigue.  HENT: Negative for congestion and sinus pressure.   Eyes: Negative for blurred vision, pain and redness.  Respiratory: Negative for cough and shortness of breath.   Cardiovascular: Negative for chest pain, palpitations and leg swelling.  Gastrointestinal: Negative for nausea and vomiting.  Musculoskeletal: Positive for neck pain. Negative for back pain.  Skin: Negative for rash.  Neurological: Negative for dizziness, loss of consciousness, headaches and loss of balance.       Objective:    Physical Exam  Constitutional: She is oriented to person, place, and time. She appears well-developed and well-nourished.  HENT:  Head: Normocephalic and atraumatic.    Eyes: Conjunctivae and EOM are normal.  Neck: Normal range of motion. Neck supple. No JVD present. Carotid bruit is not present. No thyromegaly present.  Cardiovascular: Normal rate, regular rhythm and normal heart sounds.  No murmur heard. Pulmonary/Chest: Effort normal and breath sounds normal. No respiratory distress. She has no wheezes. She has no rales. She exhibits no tenderness.  Musculoskeletal: She exhibits no edema.       Right shoulder: She exhibits pain and spasm.       Arms: Neurological: She is alert and oriented to person, place, and time.  Psychiatric: She has a normal mood and affect.  Nursing note and vitals reviewed.   BP 122/60 (BP Location: Left Arm, Cuff Size: Normal)   Pulse 73   Temp 98.7 F (37.1 C) (Oral)   Resp 16   Ht 4\' 11"  (1.499 m)    Wt 138 lb (62.6 kg)   SpO2 97%   BMI 27.87 kg/m  Wt Readings from Last 3 Encounters:  06/27/17 138 lb (62.6 kg)  01/09/17 133 lb 8 oz (60.6 kg)  11/11/16 132 lb 12.8 oz (60.2 kg)   BP Readings from Last 3  Encounters:  06/27/17 122/60  01/09/17 120/80  11/11/16 116/66     Immunization History  Administered Date(s) Administered  . Influenza,inj,Quad PF,6+ Mos 03/03/2015  . Influenza-Unspecified 04/13/2014, 03/16/2017  . Pneumococcal Conjugate-13 09/10/2015  . Pneumococcal Polysaccharide-23 03/16/2017  . Pneumococcal-Unspecified 06/13/2013  . Zoster 06/14/2011  . Zoster Recombinat (Shingrix) 05/20/2017    Health Maintenance  Topic Date Due  . Samul Dada  10/26/1965  . MAMMOGRAM  10/22/2018  . COLONOSCOPY  06/13/2021  . INFLUENZA VACCINE  Completed  . DEXA SCAN  Completed  . Hepatitis C Screening  Completed  . PNA vac Low Risk Adult  Completed    Lab Results  Component Value Date   WBC 5.1 11/11/2016   HGB 13.9 11/11/2016   HCT 41.0 11/11/2016   PLT 247.0 11/11/2016   GLUCOSE 92 09/01/2015   CHOL 229 (H) 11/11/2016   TRIG 111.0 11/11/2016   HDL 65.90 11/11/2016   LDLCALC 141 (H) 11/11/2016   ALT 25 09/01/2015   AST 25 09/01/2015   NA 139 09/01/2015   K 4.4 09/01/2015   CL 103 09/01/2015   CREATININE 0.67 09/01/2015   BUN 23 09/01/2015   CO2 32 09/01/2015   TSH 1.63 11/11/2016    Lab Results  Component Value Date   TSH 1.63 11/11/2016   Lab Results  Component Value Date   WBC 5.1 11/11/2016   HGB 13.9 11/11/2016   HCT 41.0 11/11/2016   MCV 95.6 11/11/2016   PLT 247.0 11/11/2016   Lab Results  Component Value Date   NA 139 09/01/2015   K 4.4 09/01/2015   CO2 32 09/01/2015   GLUCOSE 92 09/01/2015   BUN 23 09/01/2015   CREATININE 0.67 09/01/2015   BILITOT 1.3 (H) 09/01/2015   ALKPHOS 36 (L) 09/01/2015   AST 25 09/01/2015   ALT 25 09/01/2015   PROT 6.6 09/01/2015   ALBUMIN 4.2 09/01/2015   CALCIUM 9.6 09/01/2015   GFR 92.80 09/01/2015     Lab Results  Component Value Date   CHOL 229 (H) 11/11/2016   Lab Results  Component Value Date   HDL 65.90 11/11/2016   Lab Results  Component Value Date   LDLCALC 141 (H) 11/11/2016   Lab Results  Component Value Date   TRIG 111.0 11/11/2016   Lab Results  Component Value Date   CHOLHDL 3 11/11/2016   No results found for: HGBA1C       Assessment & Plan:   Problem List Items Addressed This Visit    None    Visit Diagnoses    New daily persistent headache    -  Primary   Relevant Medications   cyclobenzaprine (FLEXERIL) 10 MG tablet   Other Relevant Orders   CBC with Differential/Platelet   Comprehensive metabolic panel   Sedimentation rate      I am having [name] start on cyclobenzaprine. I am also having her maintain her estradiol, progesterone, LATISSE, traZODone, fluticasone, Turmeric Curcumin, Calcium Carbonate-Vitamin D (CALCIUM 500 + D PO), B Complex-C (SUPER B COMPLEX PO), Bromelains, VITAMIN E PO, Coenzyme Q10 (CO Q 10 PO), vitamin C, Magnesium, Omega-3 Fatty Acids (FISH OIL PO), Red Yeast Rice, Mag Aspart-Potassium Aspart (POTASSIUM & MAGNESIUM ASPARTAT PO), and meloxicam.  Meds ordered this encounter  Medications  . cyclobenzaprine (FLEXERIL) 10 MG tablet    Sig: Take 1 tablet (10 mg total) by mouth 3 (three) times daily as needed for muscle spasms.    Dispense:  30 tablet    Refill:  0   CMA served as Education administrator during this visit. History, Physical and Plan performed by medical provider. Documentation and orders reviewed and attested to.  Ann Held, DO

## 2017-06-27 NOTE — Telephone Encounter (Signed)
PA initiated via Covermymeds; KEY: [id number]. Awaiting determination.

## 2017-06-27 NOTE — Telephone Encounter (Signed)
Received PA approval. Authorization good until 06/27/2019.

## 2017-06-27 NOTE — Patient Instructions (Signed)

## 2017-06-28 LAB — COMPREHENSIVE METABOLIC PANEL
ALBUMIN: 4.5 g/dL (ref 3.5–5.2)
ALK PHOS: 46 U/L (ref 39–117)
ALT: 18 U/L (ref 0–35)
AST: 23 U/L (ref 0–37)
BUN: 29 mg/dL — ABNORMAL HIGH (ref 6–23)
CALCIUM: 9.7 mg/dL (ref 8.4–10.5)
CO2: 30 mEq/L (ref 19–32)
Chloride: 103 mEq/L (ref 96–112)
Creatinine, Ser: 0.79 mg/dL (ref 0.40–1.20)
GFR: 76.32 mL/min (ref >60.00)
Glucose, Bld: 83 mg/dL (ref 70–99)
POTASSIUM: 4.5 meq/L (ref 3.5–5.1)
Sodium: 139 mEq/L (ref 135–145)
TOTAL PROTEIN: 7.3 g/dL (ref 6.0–8.3)
Total Bilirubin: 0.8 mg/dL (ref 0.2–1.2)

## 2017-06-28 LAB — CBC WITH DIFFERENTIAL/PLATELET
Basophils Absolute: 0.1 10*3/uL (ref 0.0–0.1)
Basophils Relative: 0.9 % (ref 0.0–3.0)
Eosinophils Absolute: 0.1 10*3/uL (ref 0.0–0.7)
Eosinophils Relative: 1.8 % (ref 0.0–5.0)
HCT: 42 % (ref 36.0–46.0)
HEMOGLOBIN: 14 g/dL (ref 12.0–15.0)
LYMPHS ABS: 1.3 10*3/uL (ref 0.7–4.0)
Lymphocytes Relative: 18.4 % (ref 12.0–46.0)
MCHC: 33.5 g/dL (ref 30.0–36.0)
MCV: 98 fl (ref 78.0–100.0)
MONOS PCT: 7.5 % (ref 3.0–12.0)
Monocytes Absolute: 0.5 10*3/uL (ref 0.1–1.0)
Neutro Abs: 5.1 10*3/uL (ref 1.4–7.7)
Neutrophils Relative %: 71.4 % (ref 43.0–77.0)
Platelets: 285 10*3/uL (ref 150.0–400.0)
RBC: 4.28 Mil/uL (ref 3.87–5.11)
RDW: 12.7 % (ref 11.5–15.5)
WBC: 7.1 10*3/uL (ref 4.0–10.5)

## 2017-06-28 LAB — SEDIMENTATION RATE: SED RATE: 2 mm/h (ref 0–30)

## 2017-10-10 ENCOUNTER — Encounter: Payer: Self-pay | Admitting: *Deleted

## 2018-05-24 ENCOUNTER — Ambulatory Visit (INDEPENDENT_AMBULATORY_CARE_PROVIDER_SITE_OTHER): Payer: Medicare HMO | Admitting: Family Medicine

## 2018-05-24 ENCOUNTER — Encounter: Payer: Self-pay | Admitting: Family Medicine

## 2018-05-24 VITALS — BP 123/75 | HR 63 | Temp 98.6°F | Resp 16 | Ht 59.0 in | Wt 128.0 lb

## 2018-05-24 DIAGNOSIS — J069 Acute upper respiratory infection, unspecified: Secondary | ICD-10-CM

## 2018-05-24 HISTORY — DX: Acute upper respiratory infection, unspecified: J06.9

## 2018-05-24 MED ORDER — AMOXICILLIN-POT CLAVULANATE 875-125 MG PO TABS: 1.0000 | ORAL_TABLET | Freq: Two times a day (BID) | ORAL | 0 refills | Status: DC

## 2018-05-24 NOTE — Progress Notes (Signed)
Patient ID: Stephanie Lara, female    DOB: 1945/09/29  Age: 71 y.o. MRN: [id number]    Subjective:  Subjective  HPI Stephanie Lara presents for 1 week hx cough , congestion .  She is taking mucinex dm and nyquil at night.  Nonproductive cough.  Mucus is clear   + sinus pressure / headache.    No fever   Review of Systems  Constitutional: Positive for chills. Negative for fever.  HENT: Positive for congestion, postnasal drip, rhinorrhea, sinus pressure, sinus pain and sore throat.   Respiratory: Positive for cough. Negative for chest tightness, shortness of breath and wheezing.   Cardiovascular: Negative for chest pain, palpitations and leg swelling.  Allergic/Immunologic: Negative for environmental allergies.    History Past Medical History:  Diagnosis Date  . Chicken pox   . Hyperlipidemia   . Rheumatoid arthritis ([place])     She has a past surgical history that includes Lipoma excision; Rhinoplasty; Septoplasty; Breast biopsy; Placement of breast implants; and Abdominoplasty.   Her family history includes Deep vein thrombosis in her father; Diabetes in her paternal grandmother; Gallbladder disease in her father; Heart attack in her father; Heart disease in her father; Hypertension in her brother and mother; Sudden death in her father.She reports that she has never smoked. She has never used smokeless tobacco. She reports current alcohol use. She reports that she does not use drugs.  Current Outpatient Medications on File Prior to Visit  Medication Sig Dispense Refill  . Ascorbic Acid (VITAMIN C) 1000 MG tablet Take 1,000 mg by mouth daily.    . B Complex-C (SUPER B COMPLEX PO) Take 1 tablet by mouth daily.    . Bromelains 500 MG TABS Take 1 tablet by mouth 2 (two) times daily.    . Calcium Carbonate-Vitamin D (CALCIUM 500 + D PO) Take 1 capsule by mouth daily.    . Coenzyme Q10 (CO Q 10 PO) Take 200 mg by mouth.    Marland Kitchen LATISSE 0.03 % ophthalmic solution     . Mag Aspart-Potassium  Aspart (POTASSIUM & MAGNESIUM ASPARTAT PO) Take 250 mg by mouth daily.    . Magnesium 300 MG CAPS Take 1 capsule by mouth daily.    . Omega-3 Fatty Acids (FISH OIL PO) Take 300 mg by mouth 2 (two) times daily.    . Red Yeast Rice 600 MG CAPS Take by mouth.    . traZODone (DESYREL) 50 MG tablet Take 1 tablet (50 mg total) by mouth at bedtime. 90 tablet 1  . Turmeric Curcumin 500 MG CAPS Take 1 capsule by mouth daily.    Marland Kitchen VITAMIN E PO Take 450 mg by mouth 2 (two) times daily.     No current facility-administered medications on file prior to visit.      Objective:  Objective  Physical Exam Vitals signs and nursing note reviewed.  Constitutional:      Appearance: She is well-developed.  HENT:     Right Ear: External ear normal.     Left Ear: External ear normal.  Eyes:     General:        Right eye: No discharge.        Left eye: No discharge.     Conjunctiva/sclera: Conjunctivae normal.  Cardiovascular:     Rate and Rhythm: Normal rate and regular rhythm.     Heart sounds: Normal heart sounds. No murmur.  Pulmonary:     Effort: Pulmonary effort is normal. No respiratory distress.  Breath sounds: Normal breath sounds. No wheezing or rales.  Chest:     Chest wall: No tenderness.  Lymphadenopathy:     Cervical: Cervical adenopathy present.  Neurological:     Mental Status: She is alert and oriented to person, place, and time.    BP 123/75 (BP Location: Right Arm, Patient Position: Sitting, Cuff Size: Small)   Pulse 63   Temp 98.6 F (37 C) (Oral)   Resp 16   Ht 4\' 11"  (1.499 m)   Wt 128 lb (58.1 kg)   SpO2 99%   BMI 25.85 kg/m  Wt Readings from Last 3 Encounters:  05/24/18 128 lb (58.1 kg)  06/27/17 138 lb (62.6 kg)  01/09/17 133 lb 8 oz (60.6 kg)     Lab Results  Component Value Date   WBC 7.1 06/27/2017   HGB 14.0 06/27/2017   HCT 42.0 06/27/2017   PLT 285.0 06/27/2017   GLUCOSE 83 06/27/2017   CHOL 229 (H) 11/11/2016   TRIG 111.0 11/11/2016   HDL  65.90 11/11/2016   LDLCALC 141 (H) 11/11/2016   ALT 18 06/27/2017   AST 23 06/27/2017   NA 139 06/27/2017   K 4.5 06/27/2017   CL 103 06/27/2017   CREATININE 0.79 06/27/2017   BUN 29 (H) 06/27/2017   CO2 30 06/27/2017   TSH 1.63 11/11/2016    Patient was never admitted.   Assessment & Plan:  Plan  I have discontinued Stephanie Lara's estradiol, progesterone, fluticasone, meloxicam, and cyclobenzaprine. I am also having her maintain her LATISSE, traZODone, Turmeric Curcumin, Calcium Carbonate-Vitamin D (CALCIUM 500 + D PO), B Complex-C (SUPER B COMPLEX PO), Bromelains, VITAMIN E PO, Coenzyme Q10 (CO Q 10 PO), vitamin C, Magnesium, Omega-3 Fatty Acids (FISH OIL PO), Red Yeast Rice, Mag Aspart-Potassium Aspart (POTASSIUM & MAGNESIUM ASPARTAT PO), and amoxicillin-clavulanate.  Meds ordered this encounter  Medications  . DISCONTD: amoxicillin-clavulanate (AUGMENTIN) 875-125 MG tablet    Sig: Take 1 tablet by mouth 2 (two) times daily.    Dispense:  20 tablet    Refill:  0  . amoxicillin-clavulanate (AUGMENTIN) 875-125 MG tablet    Sig: Take 1 tablet by mouth 2 (two) times daily.    Dispense:  20 tablet    Refill:  0    Problem List Items Addressed This Visit      Unprioritized   Upper respiratory tract infection - Primary    Ok to con't nyquil and mucinex Take claritin in am and use flonase Fill abx if symptoms worsen          Follow-up: Return if symptoms worsen or fail to improve.  Ann Held, DO

## 2018-05-24 NOTE — Assessment & Plan Note (Signed)
Ok to con't nyquil and mucinex Take claritin in am and use flonase Fill abx if symptoms worsen

## 2018-05-24 NOTE — Patient Instructions (Signed)
Upper Respiratory Infection, Adult Most upper respiratory infections (URIs) are caused by a virus. A URI affects the nose, throat, and upper air passages. The most common type of URI is often called "the common cold." Follow these instructions at home:  Take medicines only as told by your doctor.  Gargle warm saltwater or take cough drops to comfort your throat as told by your doctor.  Use a warm mist humidifier or inhale steam from a shower to increase air moisture. This may make it easier to breathe.  Drink enough fluid to keep your pee (urine) clear or pale yellow.  Eat soups and other clear broths.  Have a healthy diet.  Rest as needed.  Go back to work when your fever is gone or your doctor says it is okay. ? You may need to stay home longer to avoid giving your URI to others. ? You can also wear a face mask and wash your hands often to prevent spread of the virus.  Use your inhaler more if you have asthma.  Do not use any tobacco products, including cigarettes, chewing tobacco, or electronic cigarettes. If you need help quitting, ask your doctor. Contact a doctor if:  You are getting worse, not better.  Your symptoms are not helped by medicine.  You have chills.  You are getting more short of breath.  You have brown or red mucus.  You have yellow or brown discharge from your nose.  You have pain in your face, especially when you bend forward.  You have a fever.  You have puffy (swollen) neck glands.  You have pain while swallowing.  You have white areas in the back of your throat. Get help right away if:  You have very bad or constant: ? Headache. ? Ear pain. ? Pain in your forehead, behind your eyes, and over your cheekbones (sinus pain). ? Chest pain.  You have long-lasting (chronic) lung disease and any of the following: ? Wheezing. ? Long-lasting cough. ? Coughing up blood. ? A change in your usual mucus.  You have a stiff neck.  You have  changes in your: ? Vision. ? Hearing. ? Thinking. ? Mood. This information is not intended to replace advice given to you by your health care provider. Make sure you discuss any questions you have with your health care provider. Document Released: 11/16/2007 Document Revised: 01/31/2016 Document Reviewed: 09/04/2013 Elsevier Interactive Patient Education  2018 [employer]  

## 2018-06-21 ENCOUNTER — Ambulatory Visit (INDEPENDENT_AMBULATORY_CARE_PROVIDER_SITE_OTHER): Payer: Medicare HMO | Admitting: Family Medicine

## 2018-06-21 ENCOUNTER — Ambulatory Visit: Payer: Self-pay

## 2018-06-21 ENCOUNTER — Encounter: Payer: Self-pay | Admitting: Family Medicine

## 2018-06-21 VITALS — BP 100/78 | HR 60 | Temp 97.7°F | Resp 16 | Ht 59.0 in | Wt 128.8 lb

## 2018-06-21 DIAGNOSIS — G4489 Other headache syndrome: Secondary | ICD-10-CM

## 2018-06-21 DIAGNOSIS — R42 Dizziness and giddiness: Secondary | ICD-10-CM

## 2018-06-21 HISTORY — DX: Dizziness and giddiness: R42

## 2018-06-21 HISTORY — DX: Other headache syndrome: G44.89

## 2018-06-21 MED ORDER — MECLIZINE HCL 25 MG PO TABS: 25.0000 mg | ORAL_TABLET | Freq: Three times a day (TID) | ORAL | 0 refills | Status: DC | PRN

## 2018-06-21 MED ORDER — KETOROLAC TROMETHAMINE 60 MG/2ML IM SOLN
60.0000 mg | Freq: Once | INTRAMUSCULAR | Status: AC
  Administered 2018-06-21: 60 mg via INTRAMUSCULAR

## 2018-06-21 NOTE — Patient Instructions (Signed)
How to Perform the Epley Maneuver  The Epley maneuver is an exercise that relieves symptoms of vertigo. Vertigo is the feeling that you or your surroundings are moving when they are not. When you feel vertigo, you may feel like the room is spinning and have trouble walking. Dizziness is a little different than vertigo. When you are dizzy, you may feel unsteady or light-headed.  You can do this maneuver at home whenever you have symptoms of vertigo. You can do it up to 3 times a day until your symptoms go away.  Even though the Epley maneuver may relieve your vertigo for a few weeks, it is possible that your symptoms will return. This maneuver relieves vertigo, but it does not relieve dizziness.  What are the risks?  If it is done correctly, the Epley maneuver is considered safe. Sometimes it can lead to dizziness or nausea that goes away after a short time. If you develop other symptoms, such as changes in vision, weakness, or numbness, stop doing the maneuver and call your health care provider.  How to perform the Epley maneuver  1. Sit on the edge of a bed or table with your back straight and your legs extended or hanging over the edge of the bed or table.  2. Turn your head halfway toward the affected ear or side.  3. Lie backward quickly with your head turned until you are lying flat on your back. You may want to position a pillow under your shoulders.  4. Hold this position for 30 seconds. You may experience an attack of vertigo. This is normal.  5. Turn your head to the opposite direction until your unaffected ear is facing the floor.  6. Hold this position for 30 seconds. You may experience an attack of vertigo. This is normal. Hold this position until the vertigo stops.  7. Turn your whole body to the same side as your head. Hold for another 30 seconds.  8. Sit back up.  You can repeat this exercise up to 3 times a day.  Follow these instructions at home:   After doing the Epley maneuver, you can return to  your normal activities.   Ask your health care provider if there is anything you should do at home to prevent vertigo. He or she may recommend that you:  ? Keep your head raised (elevated) with two or more pillows while you sleep.  ? Do not sleep on the side of your affected ear.  ? Get up slowly from bed.  ? Avoid sudden movements during the day.  ? Avoid extreme head movement, like looking up or bending over.  Contact a health care provider if:   Your vertigo gets worse.   You have other symptoms, including:  ? Nausea.  ? Vomiting.  ? Headache.  Get help right away if:   You have vision changes.   You have a severe or worsening headache or neck pain.   You cannot stop vomiting.   You have new numbness or weakness in any part of your body.  Summary   Vertigo is the feeling that you or your surroundings are moving when they are not.   The Epley maneuver is an exercise that relieves symptoms of vertigo.   If the Epley maneuver is done correctly, it is considered safe. You can do it up to 3 times a day.  This information is not intended to replace advice given to you by your health care provider. Make sure   you discuss any questions you have with your health care provider.  Document Released: 06/04/2013 Document Revised: 04/19/2016 Document Reviewed: 04/19/2016  Elsevier Interactive Patient Education  2019 [employer]  \

## 2018-06-21 NOTE — Progress Notes (Signed)
Patient ID: Stephanie Lara, female    DOB: 05-Sep-1945  Age: 73 y.o. MRN: [id number]    Subjective:  Subjective  HPI Jamileth Putzier presents for dizziness and headache.  The headache starts at the base of the scalp and goes over the top of the head to eyes.   Does not affect the vision.  Took tylenol last night and went to sleep.  The headaches never keeps her from sleeping.  Right now the ha is 5/10 and this am 2/10.   The worst it has been is 5/10.    Pt states she feels like she is spinning . + nausea, no vomiting.  No fevers  She feels this is more severe then her previous episode  Review of Systems  Constitutional: Negative for appetite change, diaphoresis, fatigue and unexpected weight change.  Eyes: Negative for pain, redness and visual disturbance.  Respiratory: Negative for cough, chest tightness, shortness of breath and wheezing.   Cardiovascular: Negative for chest pain, palpitations and leg swelling.  Endocrine: Negative for cold intolerance, heat intolerance, polydipsia, polyphagia and polyuria.  Genitourinary: Negative for difficulty urinating, dysuria and frequency.  Neurological: Positive for dizziness and headaches. Negative for light-headedness and numbness.    History Past Medical History:  Diagnosis Date  . Chicken pox   . Hyperlipidemia   . Rheumatoid arthritis ([place])     She has a past surgical history that includes Lipoma excision; Rhinoplasty; Septoplasty; Breast biopsy; Placement of breast implants; and Abdominoplasty.   Her family history includes Deep vein thrombosis in her father; Diabetes in her paternal grandmother; Gallbladder disease in her father; Heart attack in her father; Heart disease in her father; Hypertension in her brother and mother; Sudden death in her father.She reports that she has never smoked. She has never used smokeless tobacco. She reports current alcohol use. She reports that she does not use drugs.  Current Outpatient Medications on  File Prior to Visit  Medication Sig Dispense Refill  . Ascorbic Acid (VITAMIN C) 1000 MG tablet Take 1,000 mg by mouth daily.    . B Complex-C (SUPER B COMPLEX PO) Take 1 tablet by mouth daily.    . Bromelains 500 MG TABS Take 1 tablet by mouth 2 (two) times daily.    . Calcium Carbonate-Vitamin D (CALCIUM 500 + D PO) Take 1 capsule by mouth daily.    . Coenzyme Q10 (CO Q 10 PO) Take 200 mg by mouth.    Marland Kitchen LATISSE 0.03 % ophthalmic solution     . Mag Aspart-Potassium Aspart (POTASSIUM & MAGNESIUM ASPARTAT PO) Take 250 mg by mouth daily.    . Magnesium 300 MG CAPS Take 1 capsule by mouth daily.    . Omega-3 Fatty Acids (FISH OIL PO) Take 300 mg by mouth 2 (two) times daily.    . Red Yeast Rice 600 MG CAPS Take by mouth.    . traZODone (DESYREL) 50 MG tablet Take 25 mg by mouth at bedtime.    . Turmeric Curcumin 500 MG CAPS Take 1 capsule by mouth daily.    Marland Kitchen VITAMIN E PO Take 450 mg by mouth 2 (two) times daily.     No current facility-administered medications on file prior to visit.      Objective:  Objective  Physical Exam Vitals signs and nursing note reviewed.  Constitutional:      Appearance: She is well-developed.  HENT:     Head: Normocephalic and atraumatic.  Eyes:     General: No visual field  deficit.    Conjunctiva/sclera: Conjunctivae normal.  Neck:     Musculoskeletal: Normal range of motion and neck supple.     Thyroid: No thyromegaly.     Vascular: No carotid bruit or JVD.  Cardiovascular:     Rate and Rhythm: Normal rate and regular rhythm.     Heart sounds: Normal heart sounds. No murmur.  Pulmonary:     Effort: Pulmonary effort is normal. No respiratory distress.     Breath sounds: Normal breath sounds. No wheezing or rales.  Chest:     Chest wall: No tenderness.  Neurological:     Mental Status: She is alert and oriented to person, place, and time.     Cranial Nerves: No cranial nerve deficit, dysarthria or facial asymmetry.     Sensory: No sensory  deficit.     Motor: No weakness.     Coordination: Coordination normal.     Gait: Gait normal.     Deep Tendon Reflexes: Reflexes normal.  Psychiatric:        Mood and Affect: Mood normal.        Speech: Speech normal.        Behavior: Behavior normal.    BP 100/78 (BP Location: Left Arm, Cuff Size: Normal)   Pulse 60   Temp 97.7 F (36.5 C) (Oral)   Resp 16   Ht 4\' 11"  (1.499 m)   Wt 128 lb 12.8 oz (58.4 kg)   SpO2 98%   BMI 26.01 kg/m  Wt Readings from Last 3 Encounters:  06/21/18 128 lb 12.8 oz (58.4 kg)  05/24/18 128 lb (58.1 kg)  06/27/17 138 lb (62.6 kg)     Lab Results  Component Value Date   WBC 7.1 06/27/2017   HGB 14.0 06/27/2017   HCT 42.0 06/27/2017   PLT 285.0 06/27/2017   GLUCOSE 83 06/27/2017   CHOL 229 (H) 11/11/2016   TRIG 111.0 11/11/2016   HDL 65.90 11/11/2016   LDLCALC 141 (H) 11/11/2016   ALT 18 06/27/2017   AST 23 06/27/2017   NA 139 06/27/2017   K 4.5 06/27/2017   CL 103 06/27/2017   CREATININE 0.79 06/27/2017   BUN 29 (H) 06/27/2017   CO2 30 06/27/2017   TSH 1.63 11/11/2016    Patient was never admitted.   Assessment & Plan:  Plan  I have discontinued Vaughan Basta Hinners's amoxicillin-clavulanate. I am also having her start on meclizine. Additionally, I am having her maintain her LATISSE, Turmeric Curcumin, Calcium Carbonate-Vitamin D (CALCIUM 500 + D PO), B Complex-C (SUPER B COMPLEX PO), Bromelains, VITAMIN E PO, Coenzyme Q10 (CO Q 10 PO), vitamin C, Magnesium, Omega-3 Fatty Acids (FISH OIL PO), Red Yeast Rice, Mag Aspart-Potassium Aspart (POTASSIUM & MAGNESIUM ASPARTAT PO), and traZODone. We administered ketorolac.  Meds ordered this encounter  Medications  . meclizine (ANTIVERT) 25 MG tablet    Sig: Take 1 tablet (25 mg total) by mouth 3 (three) times daily as needed for dizziness.    Dispense:  30 tablet    Refill:  0  . ketorolac (TORADOL) injection 60 mg    Problem List Items Addressed This Visit      Unprioritized   Dizzy  - Primary    Check mRi/ mra due to severe dizziness and headache epley manuver given to pt to try at home      Relevant Medications   meclizine (ANTIVERT) 25 MG tablet   Other Relevant Orders   MR Angiogram Head Wo Contrast  TSH   Vitamin B12   CBC with Differential/Platelet   Comprehensive metabolic panel   Other headache syndrome   Relevant Medications   traZODone (DESYREL) 50 MG tablet   ketorolac (TORADOL) injection 60 mg (Completed)   Other Relevant Orders   MR Brain Wo Contrast   MR Angiogram Head Wo Contrast   Sedimentation rate      Follow-up: Return if symptoms worsen or fail to improve.  Ann Held, DO

## 2018-06-21 NOTE — Assessment & Plan Note (Signed)
Check mRi/ mra due to severe dizziness and headache epley manuver given to pt to try at home

## 2018-06-21 NOTE — Telephone Encounter (Signed)
  Pt called to reschedule appointment.  She states that 10 days ago she developed dizziness with headache. She first noticed the symptoms at yoga class. She states that the symptoms would come and go. She states that today the dizziness and headache are constant.  She rates her HA as mild 2-3.  She states the dizziness is a lightheaded feeling that can make her feel nauseous. She states that she has had some neck discomfort and tingling that comes and goes in her back shoulder area on the right side. She states that she has bulging disc in her neck. No Hx of cold symptoms.  Appointment scheduled per protocol.  Care advice read to patient. Pt verbalized understanding of all instructions. Reason for Disposition . [1] MODERATE dizziness (e.g., interferes with normal activities) AND [2] has NOT been evaluated by physician for this  (Exception: dizziness caused by heat exposure, sudden standing, or poor fluid intake)  Answer Assessment - Initial Assessment Questions 1. DESCRIPTION: "Describe your dizziness."     Lightheaded woozy  2. LIGHTHEADED: "Do you feel lightheaded?" (e.g., somewhat faint, woozy, weak upon standing)    woozy 3. VERTIGO: "Do you feel like either you or the room is spinning or tilting?" (i.e. vertigo)   Spins with head movement 4. SEVERITY: "How bad is it?"  "Do you feel like you are going to faint?" "Can you stand and walk?"   - MILD - walking normally   - MODERATE - interferes with normal activities (e.g., work, school)    - SEVERE - unable to stand, requires support to walk, feels like passing out now.     moderate 5. ONSET:  "When did the dizziness begin?"     10 days ago 6. AGGRAVATING FACTORS: "Does anything make it worse?" (e.g., standing, change in head position)     Changing head position 7. HEART RATE: "Can you tell me your heart rate?" "How many beats in 15 seconds?"  (Note: not all patients can do this)       No sure 8. CAUSE: "What do you think is causing the  dizziness?"     Neck disc bulging 9. RECURRENT SYMPTOM: "Have you had dizziness before?" If so, ask: "When was the last time?" "What happened that time?"     Vertigo several years ago 10. OTHER SYMPTOMS: "Do you have any other symptoms?" (e.g., fever, chest pain, vomiting, diarrhea, bleeding)       Headache, neck discomfort tingling in back of shoulder area right but comes and goes 11. PREGNANCY: "Is there any chance you are pregnant?" "When was your last menstrual period?"      N/A  Protocols used: DIZZINESS [facility]

## 2018-06-22 ENCOUNTER — Ambulatory Visit: Payer: Medicare HMO | Admitting: Family Medicine

## 2018-06-22 LAB — CBC WITH DIFFERENTIAL/PLATELET
Basophils Absolute: 0.1 10*3/uL (ref 0.0–0.1)
Basophils Relative: 1 % (ref 0.0–3.0)
Eosinophils Absolute: 0.1 10*3/uL (ref 0.0–0.7)
Eosinophils Relative: 1.3 % (ref 0.0–5.0)
HCT: 43.3 % (ref 36.0–46.0)
Hemoglobin: 14.6 g/dL (ref 12.0–15.0)
Lymphocytes Relative: 21.6 % (ref 12.0–46.0)
Lymphs Abs: 1.4 10*3/uL (ref 0.7–4.0)
MCHC: 33.7 g/dL (ref 30.0–36.0)
MCV: 96.9 fl (ref 78.0–100.0)
Monocytes Absolute: 0.5 10*3/uL (ref 0.1–1.0)
Monocytes Relative: 7.2 % (ref 3.0–12.0)
Neutro Abs: 4.4 10*3/uL (ref 1.4–7.7)
Neutrophils Relative %: 68.9 % (ref 43.0–77.0)
Platelets: 270 10*3/uL (ref 150.0–400.0)
RBC: 4.47 Mil/uL (ref 3.87–5.11)
RDW: 12.2 % (ref 11.5–15.5)
WBC: 6.4 10*3/uL (ref 4.0–10.5)

## 2018-06-22 LAB — COMPREHENSIVE METABOLIC PANEL
ALT: 30 U/L (ref 0–35)
AST: 25 U/L (ref 0–37)
Albumin: 4.4 g/dL (ref 3.5–5.2)
Alkaline Phosphatase: 53 U/L (ref 39–117)
BUN: 25 mg/dL — AB (ref 6–23)
CO2: 29 mEq/L (ref 19–32)
Calcium: 10.2 mg/dL (ref 8.4–10.5)
Chloride: 102 mEq/L (ref 96–112)
Creatinine, Ser: 0.69 mg/dL (ref 0.40–1.20)
GFR: 88.98 mL/min (ref >60.00)
Glucose, Bld: 90 mg/dL (ref 70–99)
POTASSIUM: 4.6 meq/L (ref 3.5–5.1)
SODIUM: 140 meq/L (ref 135–145)
TOTAL PROTEIN: 6.9 g/dL (ref 6.0–8.3)
Total Bilirubin: 0.9 mg/dL (ref 0.2–1.2)

## 2018-06-22 LAB — TSH: TSH: 2.38 u[IU]/mL (ref 0.35–4.50)

## 2018-06-22 LAB — VITAMIN B12: VITAMIN B 12: 518 pg/mL (ref 211–911)

## 2018-06-22 LAB — SEDIMENTATION RATE: Sed Rate: 25 mm/hr (ref 0–30)

## 2018-07-17 ENCOUNTER — Emergency Department (HOSPITAL_BASED_OUTPATIENT_CLINIC_OR_DEPARTMENT_OTHER)
Admission: EM | Admit: 2018-07-17 | Discharge: 2018-07-17 | Disposition: A | Discharge status: Home / Self Care | Payer: Medicare HMO | Attending: Emergency Medicine | Admitting: Emergency Medicine

## 2018-07-17 ENCOUNTER — Encounter (HOSPITAL_BASED_OUTPATIENT_CLINIC_OR_DEPARTMENT_OTHER): Payer: Self-pay

## 2018-07-17 ENCOUNTER — Other Ambulatory Visit: Payer: Self-pay

## 2018-07-17 ENCOUNTER — Emergency Department (HOSPITAL_BASED_OUTPATIENT_CLINIC_OR_DEPARTMENT_OTHER): Payer: Medicare HMO

## 2018-07-17 DIAGNOSIS — Z79899 Other long term (current) drug therapy: Secondary | ICD-10-CM | POA: Diagnosis not present

## 2018-07-17 DIAGNOSIS — R509 Fever, unspecified: Secondary | ICD-10-CM | POA: Insufficient documentation

## 2018-07-17 DIAGNOSIS — J111 Influenza due to unidentified influenza virus with other respiratory manifestations: Secondary | ICD-10-CM | POA: Diagnosis not present

## 2018-07-17 DIAGNOSIS — M791 Myalgia, unspecified site: Secondary | ICD-10-CM | POA: Diagnosis not present

## 2018-07-17 DIAGNOSIS — R05 Cough: Secondary | ICD-10-CM | POA: Diagnosis present

## 2018-07-17 DIAGNOSIS — R69 Illness, unspecified: Secondary | ICD-10-CM

## 2018-07-17 MED ORDER — OSELTAMIVIR PHOSPHATE 75 MG PO CAPS: 75.0000 mg | ORAL_CAPSULE | Freq: Two times a day (BID) | ORAL | 0 refills | Status: DC

## 2018-07-17 MED ORDER — IBUPROFEN 400 MG PO TABS
400.0000 mg | ORAL_TABLET | Freq: Once | ORAL | Status: AC
  Administered 2018-07-17: 400 mg via ORAL
  Filled 2018-07-17: qty 1

## 2018-07-17 MED ORDER — ONDANSETRON 4 MG PO TBDP: 4.0000 mg | ORAL_TABLET | Freq: Three times a day (TID) | ORAL | 0 refills | Status: DC | PRN

## 2018-07-17 MED FILL — ONDANSETRON ODT 4 MG TABLET: 4 | 6 days supply | Qty: 20 | Fill #0

## 2018-07-17 MED FILL — OSELTAMIVIR PHOSPHATE 75 MG: 75 | 5 days supply | Qty: 10 | Fill #0

## 2018-07-17 NOTE — ED Triage Notes (Signed)
C/o flu like sx day 2-NAD-steady gait

## 2018-07-17 NOTE — ED Notes (Signed)
NAD at this time. Pt is stable and going home.  

## 2018-07-17 NOTE — ED Provider Notes (Signed)
[facility] EMERGENCY DEPARTMENT Provider Note   CSN: [id number] Arrival date & time: 07/17/18  1232     History   Chief Complaint Chief Complaint  Patient presents with  . Cough    HPI Stephanie Lara is a 73 y.o. female presenting for evaluation of cough, fever, body aches.  Patient states last night, she started to feel very tired.  She then developed a fever, generalized body aches, nasal congestion, and cough.  Today she states she has felt horrible.  She took NyQuil and Mucinex without improvement of symptoms.  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  Patient states she did get her flu shot.  She denies tobacco, alcohol, or drug use.  She denies history of COPD or asthma.  Patient states she takes a sleep aid at night, no other medications.  She denies sick contacts.  She denies ear pain, sore throat, chest pain, difficulty breathing, nausea, vomiting, dumping, urinary symptoms, normal bowel movements, leg pain or swelling.  HPI  Past Medical History:  Diagnosis Date  . Chicken pox   . Hyperlipidemia   . Rheumatoid arthritis [facility])     Patient Active Problem List   Diagnosis Date Noted  . Dizzy 06/21/2018  . Other headache syndrome 06/21/2018  . Upper respiratory tract infection 05/24/2018  . Preventative health care 01/09/2017  . Fatigue 11/11/2016  . Urinary incontinence 03/03/2015  . Rheumatoid arthritis ([place]) 03/03/2015  . Insomnia 03/03/2015  . Globus pharyngeus 04/18/2013  . Lipoma of neck 11/02/2011  . Contact dermatitis due to plants, except food 10/20/2008    Past Surgical History:  Procedure Laterality Date  . ABDOMINOPLASTY    . BREAST BIOPSY    . LIPOMA EXCISION     neck  . PLACEMENT OF BREAST IMPLANTS    . RHINOPLASTY    . SEPTOPLASTY       OB History   No obstetric history on file.      Home Medications    Prior to Admission medications   Medication Sig  Start Date End Date Taking? Authorizing Provider  Ascorbic Acid (VITAMIN C) 1000 MG tablet Take 1,000 mg by mouth daily.    [provider]  B Complex-C (SUPER B COMPLEX PO) Take 1 tablet by mouth daily.    [provider]  Bromelains 500 MG TABS Take 1 tablet by mouth 2 (two) times daily.    [provider]  Calcium Carbonate-Vitamin D (CALCIUM 500 + D PO) Take 1 capsule by mouth daily.    [provider]  Coenzyme Q10 (CO Q 10 PO) Take 200 mg by mouth.    [provider]  LATISSE 0.03 % ophthalmic solution  01/12/15   [provider]  Mag Aspart-Potassium Aspart (POTASSIUM & MAGNESIUM ASPARTAT PO) Take 250 mg by mouth daily.    [provider]  Magnesium 300 MG CAPS Take 1 capsule by mouth daily.    [provider]  meclizine (ANTIVERT) 25 MG tablet Take 1 tablet (25 mg total) by mouth 3 (three) times daily as needed for dizziness. 06/21/18   Ann Held, DO  Omega-3 Fatty Acids (FISH OIL PO) Take 300 mg by mouth 2 (two) times daily.    [provider]  ondansetron (ZOFRAN ODT) 4 MG disintegrating tablet Take 1 tablet (4 mg total) by mouth every 8 (eight) hours as needed for nausea or vomiting. 07/17/18   Demontrae Gilbert,  Jermiah Soderman, PA-C  oseltamivir (TAMIFLU) 75 MG capsule Take 1 capsule (75 mg total) by mouth every 12 (twelve) hours. 07/17/18   Kurk Corniel, PA-C  Red Yeast Rice 600 MG CAPS Take by mouth.    [provider]  traZODone (DESYREL) 50 MG tablet Take 25 mg by mouth at bedtime.    [provider]  Turmeric Curcumin 500 MG CAPS Take 1 capsule by mouth daily.    [provider]  VITAMIN E PO Take 450 mg by mouth 2 (two) times daily.    [provider]    Family History Family History  Problem Relation Age of Onset  . Hypertension Mother   . Heart disease Father   . Sudden death Father   . Heart attack Father   . Gallbladder disease Father   . Deep vein thrombosis  Father   . Diabetes Paternal Grandmother   . Hypertension Brother     Social History Social History   Tobacco Use  . Smoking status: Never Smoker  . Smokeless tobacco: Never Used  Substance Use Topics  . Alcohol use: Yes    Alcohol/week: 0.0 standard drinks    Comment: Occ  . Drug use: No     Allergies   Patient has no known allergies.   Review of Systems Review of Systems  Constitutional: Positive for fever.  HENT: Positive for congestion.   Respiratory: Positive for cough.   Musculoskeletal: Positive for myalgias.  All other systems reviewed and are negative.    Physical Exam Updated Vital Signs BP 134/79   Pulse 83   Temp 100.3 F (37.9 C) (Oral)   Resp 16   Ht 5' (1.524 m)   Wt 58.1 kg   SpO2 97%   BMI 25.00 kg/m   Physical Exam Vitals signs and nursing note reviewed.  Constitutional:      General: She is not in acute distress.    Appearance: She is well-developed.     Comments: Laying in the bed in no acute distress  HENT:     Head: Normocephalic and atraumatic.     Comments: OP mildly erythematous without tonsillar swelling or exudate.  Uvula midline with equal palate rise.  Neither TM visualized due to cerumen.  Nasal congestion noted.    Right Ear: External ear normal.     Left Ear: External ear normal.     Nose: Mucosal edema and congestion present.     Right Sinus: No maxillary sinus tenderness or frontal sinus tenderness.     Left Sinus: No maxillary sinus tenderness or frontal sinus tenderness.     Mouth/Throat:     Pharynx: Uvula midline. Posterior oropharyngeal erythema present.     Tonsils: No tonsillar exudate.  Eyes:     Conjunctiva/sclera: Conjunctivae normal.     Pupils: Pupils are equal, round, and reactive to light.  Neck:     Musculoskeletal: Normal range of motion.  Cardiovascular:     Rate and Rhythm: Normal rate and regular rhythm.     Pulses: Normal pulses.  Pulmonary:     Effort: Pulmonary effort is normal.     Breath  sounds: Normal breath sounds. No decreased breath sounds, wheezing, rhonchi or rales.     Comments: Speaking in full sentences.  Clear lung sounds in all fields Abdominal:     General: There is no distension.     Palpations: Abdomen is soft. There is no mass.     Tenderness: There is no abdominal tenderness. There  is no guarding or rebound.  Musculoskeletal: Normal range of motion.  Lymphadenopathy:     Cervical: Cervical adenopathy present.  Skin:    General: Skin is warm.     Capillary Refill: Capillary refill takes less than 2 seconds.  Neurological:     Mental Status: She is alert and oriented to person, place, and time.      ED Treatments / Results  Labs (all labs ordered are listed, but only abnormal results are displayed) Labs Reviewed - No data to display  EKG None  Radiology Dg Chest 2 View  Result Date: 07/17/2018 CLINICAL DATA:  Body aches, cough, fever, and congestion since last night EXAM: CHEST - 2 VIEW COMPARISON:  None FINDINGS: Normal heart size, mediastinal contours, and pulmonary vascularity. Minimal peribronchial thickening. Lungs clear. No pulmonary infiltrate, pleural effusion or pneumothorax. Bones unremarkable. IMPRESSION: Bronchitic changes without infiltrate. Electronically Signed   By: Lavonia Dana M.D.   On: 07/17/2018 15:26    Procedures Procedures (including critical care time)  Medications Ordered in ED Medications  ibuprofen (ADVIL,MOTRIN) tablet 400 mg (400 mg Oral Given 07/17/18 1303)     Initial Impression / Assessment and Plan / ED Course  I have reviewed the triage vital signs and the nursing notes.  Pertinent labs & imaging results that were available during my care of the patient were reviewed by me and considered in my medical decision making (see chart for details).     Pt presenting for evaluation of 1 day of flulike symptoms.  Physical exam shows patient appears nontoxic.  Upon arrival, patient was brought up to 102, this improved  with ibuprofen as expected.  No tachycardia, hypotension, or hypoxia.  As such, low suspicion for PE.  No signs of respiratory distress.  Considering age, fever, cough, body aches, will obtain chest x-ray to rule out pneumonia.  If negative, likely viral illness.  Discussed option of treating for flu. Case discussed with attending, Dr. Tomi Bamberger evaluated the pt.   Reviewed interpreted by me, no pneumonia, pneumothorax, effusion.  Per radiology, bronchitic-like changes.  Likely viral in nature.  As symptoms began less than 24 hours ago, and acute onset with high fevers, consider possible flu.  Discussed with patient, who would like to try Tamiflu.  Discussed side effects including nausea, Zofran given in conjunction.  Patient to follow-up with PCP as needed.  At this time, patient appears safe for discharge.  Return precautions given.  Patient states she understands and agrees to plan.  Final Clinical Impressions(s) / ED Diagnoses   Final diagnoses:  Influenza-like illness    ED Discharge Orders         Ordered    oseltamivir (TAMIFLU) 75 MG capsule  Every 12 hours     07/17/18 1538    ondansetron (ZOFRAN ODT) 4 MG disintegrating tablet  Every 8 hours PRN     07/17/18 1538           Tenika Keeran, PA-C 07/17/18 1605    Dorie Rank, MD 07/18/18 1108

## 2018-07-17 NOTE — Discharge Instructions (Addendum)
You likely have a viral illness.  This may be the flu or another virus.  Tamiflu as prescribed.  Use Zofran as needed for nausea or vomiting. Use Tylenol or ibuprofen as needed for fevers or body aches. Use cough drops as needed. Make sure you stay well-hydrated with water. Wash your hands frequently to prevent spread of infection. Follow-up with your primary care doctor in 1 week if your symptoms are not improving. Return to the emergency room if you develop chest pain, difficulty breathing, or any new or worsening symptoms.

## 2019-04-04 ENCOUNTER — Encounter: Payer: Self-pay | Admitting: Family Medicine

## 2019-04-04 ENCOUNTER — Other Ambulatory Visit: Payer: Self-pay

## 2019-04-04 ENCOUNTER — Ambulatory Visit (INDEPENDENT_AMBULATORY_CARE_PROVIDER_SITE_OTHER): Payer: Medicare HMO | Admitting: Family Medicine

## 2019-04-04 VITALS — BP 120/70 | HR 70 | Temp 97.3°F | Resp 18 | Ht 60.0 in | Wt 132.0 lb

## 2019-04-04 DIAGNOSIS — M0579 Rheumatoid arthritis with rheumatoid factor of multiple sites without organ or systems involvement: Secondary | ICD-10-CM | POA: Diagnosis not present

## 2019-04-04 DIAGNOSIS — N952 Postmenopausal atrophic vaginitis: Secondary | ICD-10-CM | POA: Diagnosis not present

## 2019-04-04 DIAGNOSIS — Z0000 Encounter for general adult medical examination without abnormal findings: Secondary | ICD-10-CM

## 2019-04-04 DIAGNOSIS — Z20828 Contact with and (suspected) exposure to other viral communicable diseases: Secondary | ICD-10-CM

## 2019-04-04 DIAGNOSIS — Z01419 Encounter for gynecological examination (general) (routine) without abnormal findings: Secondary | ICD-10-CM | POA: Diagnosis not present

## 2019-04-04 DIAGNOSIS — Z20822 Contact with and (suspected) exposure to covid-19: Secondary | ICD-10-CM

## 2019-04-04 DIAGNOSIS — E785 Hyperlipidemia, unspecified: Secondary | ICD-10-CM | POA: Diagnosis not present

## 2019-04-04 DIAGNOSIS — Z23 Encounter for immunization: Secondary | ICD-10-CM

## 2019-04-04 DIAGNOSIS — N951 Menopausal and female climacteric states: Secondary | ICD-10-CM | POA: Diagnosis not present

## 2019-04-04 DIAGNOSIS — Z79890 Hormone replacement therapy (postmenopausal): Secondary | ICD-10-CM | POA: Diagnosis not present

## 2019-04-04 DIAGNOSIS — N905 Atrophy of vulva: Secondary | ICD-10-CM | POA: Diagnosis not present

## 2019-04-04 LAB — LIPID PANEL
Cholesterol: 244 mg/dL — ABNORMAL HIGH (ref 0–200)
HDL: 72.6 mg/dL (ref >39.00)
LDL Cholesterol: 161 mg/dL — ABNORMAL HIGH (ref 0–99)
NonHDL: 171.18
Total CHOL/HDL Ratio: 3
Triglycerides: 53 mg/dL (ref 0.0–149.0)
VLDL: 10.6 mg/dL (ref 0.0–40.0)

## 2019-04-04 LAB — COMPREHENSIVE METABOLIC PANEL
ALT: 19 U/L (ref 0–35)
AST: 23 U/L (ref 0–37)
Albumin: 4.1 g/dL (ref 3.5–5.2)
Alkaline Phosphatase: 58 U/L (ref 39–117)
BUN: 28 mg/dL — ABNORMAL HIGH (ref 6–23)
CO2: 32 mEq/L (ref 19–32)
Calcium: 9.2 mg/dL (ref 8.4–10.5)
Chloride: 104 mEq/L (ref 96–112)
Creatinine, Ser: 0.73 mg/dL (ref 0.40–1.20)
GFR: 78.27 mL/min (ref >60.00)
Glucose, Bld: 81 mg/dL (ref 70–99)
Potassium: 4.2 mEq/L (ref 3.5–5.1)
Sodium: 141 mEq/L (ref 135–145)
Total Bilirubin: 0.9 mg/dL (ref 0.2–1.2)
Total Protein: 6.3 g/dL (ref 6.0–8.3)

## 2019-04-04 LAB — HIGH SENSITIVITY CRP: CRP, High Sensitivity: 2.88 mg/L (ref 0.000–5.000)

## 2019-04-04 LAB — SARS-COV-2 IGG: SARS-COV-2 IgG: 0.06

## 2019-04-04 MED ORDER — MELOXICAM 15 MG PO TABS: 15.0000 mg | ORAL_TABLET | Freq: Every day | ORAL | 1 refills | Status: DC

## 2019-04-04 NOTE — Patient Instructions (Signed)
Preventive Care 72 Years and Older, Female Preventive care refers to lifestyle choices and visits with your health care provider that can promote health and wellness. This includes:  A yearly physical exam. This is also called an annual well check.  Regular dental and eye exams.  Immunizations.  Screening for certain conditions.  Healthy lifestyle choices, such as diet and exercise. What can I expect for my preventive care visit? Physical exam Your health care provider will check:  Height and weight. These may be used to calculate body mass index (BMI), which is a measurement that tells if you are at a healthy weight.  Heart rate and blood pressure.  Your skin for abnormal spots. Counseling Your health care provider may ask you questions about:  Alcohol, tobacco, and drug use.  Emotional well-being.  Home and relationship well-being.  Sexual activity.  Eating habits.  History of falls.  Memory and ability to understand (cognition).  Work and work Statistician.  Pregnancy and menstrual history. What immunizations do I need?  Influenza (flu) vaccine  This is recommended every year. Tetanus, diphtheria, and pertussis (Tdap) vaccine  You may need a Td booster every 10 years. Varicella (chickenpox) vaccine  You may need this vaccine if you have not already been vaccinated. Zoster (shingles) vaccine  You may need this after age 72. Pneumococcal conjugate (PCV13) vaccine  One dose is recommended after age 72. Pneumococcal polysaccharide (PPSV23) vaccine  One dose is recommended after age 72. Measles, mumps, and rubella (MMR) vaccine  You may need at least one dose of MMR if you were born in 1957 or later. You may also need a second dose. Meningococcal conjugate (MenACWY) vaccine  You may need this if you have certain conditions. Hepatitis A vaccine  You may need this if you have certain conditions or if you travel or work in places where you may be exposed  to hepatitis A. Hepatitis B vaccine  You may need this if you have certain conditions or if you travel or work in places where you may be exposed to hepatitis B. Haemophilus influenzae type b (Hib) vaccine  You may need this if you have certain conditions. You may receive vaccines as individual doses or as more than one vaccine together in one shot (combination vaccines). Talk with your health care provider about the risks and benefits of combination vaccines. What tests do I need? Blood tests  Lipid and cholesterol levels. These may be checked every 5 years, or more frequently depending on your overall health.  Hepatitis C test.  Hepatitis B test. Screening  Lung cancer screening. You may have this screening every year starting at age 72 if you have a 30-pack-year history of smoking and currently smoke or have quit within the past 15 years.  Colorectal cancer screening. All adults should have this screening starting at age 72 and continuing until age 15. Your health care provider may recommend screening at age 23 if you are at increased risk. You will have tests every 1-10 years, depending on your results and the type of screening test.  Diabetes screening. This is done by checking your blood sugar (glucose) after you have not eaten for a while (fasting). You may have this done every 1-3 years.  Mammogram. This may be done every 1-2 years. Talk with your health care provider about how often you should have regular mammograms.  BRCA-related cancer screening. This may be done if you have a family history of breast, ovarian, tubal, or peritoneal cancers.  Other tests  Sexually transmitted disease (STD) testing.  Bone density scan. This is done to screen for osteoporosis. You may have this done starting at age 72. Follow these instructions at home: Eating and drinking  Eat a diet that includes fresh fruits and vegetables, whole grains, lean protein, and low-fat dairy products. Limit  your intake of foods with high amounts of sugar, saturated fats, and salt.  Take vitamin and mineral supplements as recommended by your health care provider.  Do not drink alcohol if your health care provider tells you not to drink.  If you drink alcohol: ? Limit how much you have to 0-1 drink a day. ? Be aware of how much alcohol is in your drink. In the [place] one drink equals one 12 oz bottle of beer (355 mL), one 5 oz glass of wine (148 mL), or one 1 oz glass of hard liquor (44 mL). Lifestyle  Take daily care of your teeth and gums.  Stay active. Exercise for at least 30 minutes on 5 or more days each week.  Do not use any products that contain nicotine or tobacco, such as cigarettes, e-cigarettes, and chewing tobacco. If you need help quitting, ask your health care provider.  If you are sexually active, practice safe sex. Use a condom or other form of protection in order to prevent STIs (sexually transmitted infections).  Talk with your health care provider about taking a low-dose aspirin or statin. What's next?  Go to your health care provider once a year for a well check visit.  Ask your health care provider how often you should have your eyes and teeth checked.  Stay up to date on all vaccines. This information is not intended to replace advice given to you by your health care provider. Make sure you discuss any questions you have with your health care provider. Document Released: 06/26/2015 Document Revised: 05/24/2018 Document Reviewed: 05/24/2018 Elsevier Patient Education  2020 [employer].

## 2019-04-04 NOTE — Progress Notes (Signed)
Subjective:     Stephanie Lara is a 73 y.o. female and is here for a comprehensive physical exam. The patient reports no problems.  Social History   Socioeconomic History  . Marital status: Married    Spouse name: Not on file  . Number of children: Not on file  . Years of education: Not on file  . Highest education level: Not on file  Occupational History  . Occupation: retired Teacher, 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[employer]  Social Needs  . Financial resource strain: Not on file  . Food insecurity    Worry: Not on file    Inability: Not on file  . Transportation needs    Medical: Not on file    Non-medical: Not on file  Tobacco Use  . Smoking status: Never Smoker  . Smokeless tobacco: Never Used  Substance and Sexual Activity  . Alcohol use: Yes    Alcohol/week: 0.0 standard drinks    Comment: Occ  . Drug use: No  . Sexual activity: Not on file  Lifestyle  . Physical activity    Days per week: Not on file    Minutes per session: Not on file  . Stress: Not on file  Relationships  . Social Herbalist on phone: Not on file    Gets together: Not on file    Attends religious service: Not on file    Active member of club or organization: Not on file    Attends meetings of clubs or organizations: Not on file    Relationship status: Not on file  . Intimate partner violence    Fear of current or ex partner: Not on file    Emotionally abused: Not on file    Physically abused: Not on file    Forced sexual activity: Not on file  Other Topics Concern  . Not on file  Social History Narrative  . Not on file   Health Maintenance  Topic Date Due  . TETANUS/TDAP  10/26/1965  . MAMMOGRAM  10/22/2018  . COLONOSCOPY  06/13/2021  . INFLUENZA VACCINE  Completed  . DEXA SCAN  Completed  . Hepatitis C Screening  Completed  . PNA vac Low Risk Adult  Completed    The following portions of the patient's history were reviewed and updated as appropriate: She  has a past medical history of  Chicken pox, Hyperlipidemia, and Rheumatoid arthritis ([place]). She does not have any pertinent problems on file. She  has a past surgical history that includes Lipoma excision; Rhinoplasty; Septoplasty; Breast biopsy; Placement of breast implants; and Abdominoplasty. Her family history includes Deep vein thrombosis in her father; Diabetes in her paternal grandmother; Gallbladder disease in her father; Heart attack in her father; Heart disease in her father; Hypertension in her brother and mother; Sudden death in her father. She  reports that she has never smoked. She has never used smokeless tobacco. She reports current alcohol use. She reports that she does not use drugs. She has a current medication list which includes the following prescription(s): vitamin c, b complex-c, calcium carbonate-vitamin d, coenzyme q10, latisse, mag aspart-potassium aspart, magnesium, meclizine, omega-3 fatty acids, paroxetine, red yeast rice, trazodone, turmeric curcumin, and vitamin e. Current Outpatient Medications on File Prior to Visit  Medication Sig Dispense Refill  . Ascorbic Acid (VITAMIN C) 1000 MG tablet Take 1,000 mg by mouth daily.    . B Complex-C (SUPER B COMPLEX PO) Take 1 tablet by mouth daily.    . Calcium Carbonate-Vitamin D (  CALCIUM 500 + D PO) Take 1 capsule by mouth daily.    . Coenzyme Q10 (CO Q 10 PO) Take 200 mg by mouth.    Marland Kitchen LATISSE 0.03 % ophthalmic solution     . Mag Aspart-Potassium Aspart (POTASSIUM & MAGNESIUM ASPARTAT PO) Take 250 mg by mouth daily.    . Magnesium 300 MG CAPS Take 1 capsule by mouth daily.    . meclizine (ANTIVERT) 25 MG tablet Take 1 tablet (25 mg total) by mouth 3 (three) times daily as needed for dizziness. 30 tablet 0  . Omega-3 Fatty Acids (FISH OIL PO) Take 300 mg by mouth 2 (two) times daily.    Marland Kitchen PARoxetine (PAXIL) 10 MG tablet     . Red Yeast Rice 600 MG CAPS Take by mouth.    . traZODone (DESYREL) 50 MG tablet Take 25 mg by mouth at bedtime.    . Turmeric  Curcumin 500 MG CAPS Take 1 capsule by mouth daily.    Marland Kitchen VITAMIN E PO Take 450 mg by mouth 2 (two) times daily.     No current facility-administered medications on file prior to visit.    She has No Known Allergies..  Review of Systems Review of Systems  Constitutional: Negative for activity change, appetite change and fatigue.  HENT: Negative for hearing loss, congestion, tinnitus and ear discharge.  dentist q68m Eyes: Negative for visual disturbance (see optho q1y -- vision corrected to 20/20 with glasses).  Respiratory: Negative for cough, chest tightness and shortness of breath.   Cardiovascular: Negative for chest pain, palpitations and leg swelling.  Gastrointestinal: Negative for abdominal pain, diarrhea, constipation and abdominal distention.  Genitourinary: Negative for urgency, frequency, decreased urine volume and difficulty urinating.  Musculoskeletal: Negative for back pain, arthralgias and gait problem.  Skin: Negative for color change, pallor and rash.  Neurological: Negative for dizziness, light-headedness, numbness and headaches.  Hematological: Negative for adenopathy. Does not bruise/bleed easily.  Psychiatric/Behavioral: Negative for suicidal ideas, confusion, sleep disturbance, self-injury, dysphoric mood, decreased concentration and agitation.       Objective:    BP 120/70 (BP Location: Right Arm, Patient Position: Sitting, Cuff Size: Normal)   Pulse 70   Temp (!) 97.3 F (36.3 C) (Temporal)   Resp 18   Ht 5' (1.524 m)   Wt 132 lb (59.9 kg)   SpO2 98%   BMI 25.78 kg/m  General appearance: alert, cooperative, appears stated age and no distress Head: Normocephalic, without obvious abnormality, atraumatic Eyes: conjunctivae/corneas clear. PERRL, EOM's intact. Fundi benign. Ears: normal TM's and external ear canals both ears Nose: Nares normal. Septum midline. Mucosa normal. No drainage or sinus tenderness. Throat: lips, mucosa, and tongue normal; teeth and  gums normal Neck: no adenopathy, no carotid bruit, no JVD, supple, symmetrical, trachea midline and thyroid not enlarged, symmetric, no tenderness/mass/nodules Back: symmetric, no curvature. ROM normal. No CVA tenderness. Lungs: clear to auscultation bilaterally Breasts: normal appearance, no masses or tenderness Heart: regular rate and rhythm, S1, S2 normal, no murmur, click, rub or gallop Abdomen: soft, non-tender; bowel sounds normal; no masses,  no organomegaly Pelvic: deferred--gyn Extremities: extremities normal, atraumatic, no cyanosis or edema Pulses: 2+ and symmetric Skin: Skin color, texture, turgor normal. No rashes or lesions Lymph nodes: Cervical, supraclavicular, and axillary nodes normal. Neurologic: Alert and oriented X 3, normal strength and tone. Normal symmetric reflexes. Normal coordination and gait    Assessment:    Healthy female exam.      Plan:  ghm utd  Check labs  See After Visit Summary for Counseling Recommendations    1. Need for influenza vaccination  - Flu Vaccine QUAD 6+ mos PF IM (Fluarix Quad PF)  2. Hyperlipidemia, unspecified hyperlipidemia type Encouraged heart healthy diet, increase exercise, avoid trans fats, consider a krill oil cap daily - Lipid panel - Comprehensive metabolic panel - CRP High sensitivity  3. Rheumatoid arthritis involving multiple sites with positive rheumatoid factor (HCC) Pt does not see a rheum anymore--  Pain is controlled with mobic - meloxicam (MOBIC) 15 MG tablet; Take 1 tablet (15 mg total) by mouth daily.  Dispense: 90 tablet; Refill: 1 - CRP High sensitivity

## 2019-04-25 ENCOUNTER — Other Ambulatory Visit: Payer: Self-pay

## 2019-04-26 ENCOUNTER — Encounter: Payer: Self-pay | Admitting: Family Medicine

## 2019-04-26 ENCOUNTER — Ambulatory Visit (HOSPITAL_BASED_OUTPATIENT_CLINIC_OR_DEPARTMENT_OTHER)
Admission: RE | Admit: 2019-04-26 | Discharge: 2019-04-26 | Disposition: A | Discharge status: Home / Self Care | Payer: Medicare HMO | Source: Ambulatory Visit | Attending: Family Medicine | Admitting: Family Medicine

## 2019-04-26 ENCOUNTER — Other Ambulatory Visit: Payer: Self-pay

## 2019-04-26 ENCOUNTER — Ambulatory Visit (INDEPENDENT_AMBULATORY_CARE_PROVIDER_SITE_OTHER): Payer: Medicare HMO | Admitting: Family Medicine

## 2019-04-26 DIAGNOSIS — R232 Flushing: Secondary | ICD-10-CM

## 2019-04-26 DIAGNOSIS — G4489 Other headache syndrome: Secondary | ICD-10-CM | POA: Diagnosis not present

## 2019-04-26 DIAGNOSIS — R519 Headache, unspecified: Secondary | ICD-10-CM | POA: Diagnosis not present

## 2019-04-26 HISTORY — DX: Flushing: R23.2

## 2019-04-26 NOTE — Progress Notes (Signed)
Patient ID: Stephanie Lara, female    DOB: 1945/10/03  Age: 73 y.o. MRN: [id number]    Subjective:  Subjective  HPI Stephanie Lara presents for elevated bp and headaches starting the 11th---  141/81   ,  129/82    She had run out of her paxil  ---- and was without it for 2 weeks she is back on it    Her bp has been coming down    She is still having headaches and when it first started she had blurry vision.  That has cleared up She still has headaches    Review of Systems  Constitutional: Negative for appetite change, diaphoresis, fatigue and unexpected weight change.  Eyes: Negative for pain, redness and visual disturbance.  Respiratory: Negative for cough, chest tightness, shortness of breath and wheezing.   Cardiovascular: Negative for chest pain, palpitations and leg swelling.  Endocrine: Negative for cold intolerance, heat intolerance, polydipsia, polyphagia and polyuria.  Genitourinary: Negative for difficulty urinating, dysuria and frequency.  Neurological: Negative for dizziness, light-headedness, numbness and headaches.    History Past Medical History:  Diagnosis Date  . Chicken pox   . Hyperlipidemia   . Rheumatoid arthritis ([place])     She has a past surgical history that includes Lipoma excision; Rhinoplasty; Septoplasty; Breast biopsy; Placement of breast implants; and Abdominoplasty.   Her family history includes Deep vein thrombosis in her father; Diabetes in her paternal grandmother; Gallbladder disease in her father; Heart attack in her father; Heart disease in her father; Hypertension in her brother and mother; Sudden death in her father.She reports that she has never smoked. She has never used smokeless tobacco. She reports current alcohol use. She reports that she does not use drugs.  Current Outpatient Medications on File Prior to Visit  Medication Sig Dispense Refill  . Ascorbic Acid (VITAMIN C) 1000 MG tablet Take 1,000 mg by mouth daily.    . B Complex-C  (SUPER B COMPLEX PO) Take 1 tablet by mouth daily.    . Calcium Carbonate-Vitamin D (CALCIUM 500 + D PO) Take 1 capsule by mouth daily.    . Coenzyme Q10 (CO Q 10 PO) Take 200 mg by mouth.    Marland Kitchen LATISSE 0.03 % ophthalmic solution     . Mag Aspart-Potassium Aspart (POTASSIUM & MAGNESIUM ASPARTAT PO) Take 250 mg by mouth daily.    . Magnesium 300 MG CAPS Take 1 capsule by mouth daily.    . meclizine (ANTIVERT) 25 MG tablet Take 1 tablet (25 mg total) by mouth 3 (three) times daily as needed for dizziness. 30 tablet 0  . meloxicam (MOBIC) 15 MG tablet Take 1 tablet (15 mg total) by mouth daily. 90 tablet 1  . Omega-3 Fatty Acids (FISH OIL PO) Take 300 mg by mouth 2 (two) times daily.    Marland Kitchen PARoxetine (PAXIL) 10 MG tablet     . Red Yeast Rice 600 MG CAPS Take by mouth.    . traZODone (DESYREL) 50 MG tablet Take 25 mg by mouth at bedtime.    . Turmeric Curcumin 500 MG CAPS Take 1 capsule by mouth daily.    Marland Kitchen VITAMIN E PO Take 450 mg by mouth 2 (two) times daily.     No current facility-administered medications on file prior to visit.      Objective:  Objective  Physical Exam Vitals signs and nursing note reviewed.  Constitutional:      Appearance: She is well-developed.  HENT:     Head:  Normocephalic and atraumatic.     Right Ear: Tympanic membrane, ear canal and external ear normal.     Left Ear: Tympanic membrane, ear canal and external ear normal.     Nose: Nose normal.  Eyes:     Conjunctiva/sclera: Conjunctivae normal.  Neck:     Musculoskeletal: Normal range of motion and neck supple.     Thyroid: No thyromegaly.     Vascular: No carotid bruit or JVD.  Cardiovascular:     Rate and Rhythm: Normal rate and regular rhythm.     Heart sounds: Normal heart sounds. No murmur.  Pulmonary:     Effort: Pulmonary effort is normal. No respiratory distress.     Breath sounds: Normal breath sounds. No wheezing or rales.  Chest:     Chest wall: No tenderness.  Neurological:     Mental  Status: She is alert and oriented to person, place, and time.     Cranial Nerves: Cranial nerves are intact.     Sensory: Sensation is intact.     Motor: Motor function is intact.     Coordination: Coordination is intact.    BP 136/70 (BP Location: Right Arm, Patient Position: Sitting, Cuff Size: Normal)   Pulse 69   Temp 97.6 F (36.4 C) (Temporal)   Resp 18   Ht 5' (1.524 m)   Wt 129 lb 6.4 oz (58.7 kg)   SpO2 99%   BMI 25.27 kg/m  Wt Readings from Last 3 Encounters:  04/26/19 129 lb 6.4 oz (58.7 kg)  04/04/19 132 lb (59.9 kg)  07/17/18 128 lb (58.1 kg)     Lab Results  Component Value Date   WBC 6.4 06/21/2018   HGB 14.6 06/21/2018   HCT 43.3 06/21/2018   PLT 270.0 06/21/2018   GLUCOSE 81 04/04/2019   CHOL 244 (H) 04/04/2019   TRIG 53.0 04/04/2019   HDL 72.60 04/04/2019   LDLCALC 161 (H) 04/04/2019   ALT 19 04/04/2019   AST 23 04/04/2019   NA 141 04/04/2019   K 4.2 04/04/2019   CL 104 04/04/2019   CREATININE 0.73 04/04/2019   BUN 28 (H) 04/04/2019   CO2 32 04/04/2019   TSH 2.38 06/21/2018    Dg Chest 2 View  Result Date: 07/17/2018 CLINICAL DATA:  Body aches, cough, fever, and congestion since last night EXAM: CHEST - 2 VIEW COMPARISON:  None FINDINGS: Normal heart size, mediastinal contours, and pulmonary vascularity. Minimal peribronchial thickening. Lungs clear. No pulmonary infiltrate, pleural effusion or pneumothorax. Bones unremarkable. IMPRESSION: Bronchitic changes without infiltrate. Electronically Signed   By: Lavonia Dana M.D.   On: 07/17/2018 15:26     Assessment & Plan:  Plan  I am having Stephanie Lara maintain her Latisse, Turmeric Curcumin, Calcium Carbonate-Vitamin D (CALCIUM 500 + D PO), B Complex-C (SUPER B COMPLEX PO), VITAMIN E PO, Coenzyme Q10 (CO Q 10 PO), vitamin C, Magnesium, Omega-3 Fatty Acids (FISH OIL PO), Red Yeast Rice, Mag Aspart-Potassium Aspart (POTASSIUM & MAGNESIUM ASPARTAT PO), traZODone, meclizine, PARoxetine, and meloxicam.   No orders of the defined types were placed in this encounter.   Problem List Items Addressed This Visit      Unprioritized   Hot flashes    paxil restarted 2 days ago Should improve with time       Other headache syndrome    ? Running out of paxil contributed Blurry vision is worrisome--  Check CT head       Relevant Orders   CT  Head Wo Contrast (Completed)      Follow-up: Return if symptoms worsen or fail to improve.  Ann Held, DO

## 2019-04-26 NOTE — Patient Instructions (Addendum)
DASH Eating Plan DASH stands for "Dietary Approaches to Stop Hypertension." The DASH eating plan is a healthy eating plan that has been shown to reduce high blood pressure (hypertension). It may also reduce your risk for type 2 diabetes, heart disease, and stroke. The DASH eating plan may also help with weight loss. What are tips for following this plan?  General guidelines  Avoid eating more than 2,300 mg (milligrams) of salt (sodium) a day. If you have hypertension, you may need to reduce your sodium intake to 1,500 mg a day.  Limit alcohol intake to no more than 1 drink a day for nonpregnant women and 2 drinks a day for men. One drink equals 12 oz of beer, 5 oz of wine, or 1 oz of hard liquor.  Work with your health care provider to maintain a healthy body weight or to lose weight. Ask what an ideal weight is for you.  Get at least 30 minutes of exercise that causes your heart to beat faster (aerobic exercise) most days of the week. Activities may include walking, swimming, or biking.  Work with your health care provider or diet and nutrition specialist (dietitian) to adjust your eating plan to your individual calorie needs. Reading food labels   Check food labels for the amount of sodium per serving. Choose foods with less than 5 percent of the Daily Value of sodium. Generally, foods with less than 300 mg of sodium per serving fit into this eating plan.  To find whole grains, look for the word "whole" as the first word in the ingredient list. Shopping  Buy products labeled as "low-sodium" or "no salt added."  Buy fresh foods. Avoid canned foods and premade or frozen meals. Cooking  Avoid adding salt when cooking. Use salt-free seasonings or herbs instead of table salt or sea salt. Check with your health care provider or pharmacist before using salt substitutes.  Do not fry foods. Cook foods using healthy methods such as baking, boiling, grilling, and broiling instead.  Cook with  heart-healthy oils, such as olive, canola, soybean, or sunflower oil. Meal planning  Eat a balanced diet that includes: ? 5 or more servings of fruits and vegetables each day. At each meal, try to fill half of your plate with fruits and vegetables. ? Up to 6-8 servings of whole grains each day. ? Less than 6 oz of lean meat, poultry, or fish each day. A 3-oz serving of meat is about the same size as a deck of cards. One egg equals 1 oz. ? 2 servings of low-fat dairy each day. ? A serving of nuts, seeds, or beans 5 times each week. ? Heart-healthy fats. Healthy fats called Omega-3 fatty acids are found in foods such as flaxseeds and coldwater fish, like sardines, salmon, and mackerel.  Limit how much you eat of the following: ? Canned or prepackaged foods. ? Food that is high in trans fat, such as fried foods. ? Food that is high in saturated fat, such as fatty meat. ? Sweets, desserts, sugary drinks, and other foods with added sugar. ? Full-fat dairy products.  Do not salt foods before eating.  Try to eat at least 2 vegetarian meals each week.  Eat more home-cooked food and less restaurant, buffet, and fast food.  When eating at a restaurant, ask that your food be prepared with less salt or no salt, if possible. What foods are recommended? The items listed may not be a complete list. Talk with your dietitian about   what dietary choices are best for you. Grains Whole-grain or whole-wheat bread. Whole-grain or whole-wheat pasta. Brown rice. Oatmeal. Quinoa. Bulgur. Whole-grain and low-sodium cereals. Pita bread. Low-fat, low-sodium crackers. Whole-wheat flour tortillas. Vegetables Fresh or frozen vegetables (raw, steamed, roasted, or grilled). Low-sodium or reduced-sodium tomato and vegetable juice. Low-sodium or reduced-sodium tomato sauce and tomato paste. Low-sodium or reduced-sodium canned vegetables. Fruits All fresh, dried, or frozen fruit. Canned fruit in natural juice (without  added sugar). Meat and other protein foods Skinless chicken or turkey. Ground chicken or turkey. Pork with fat trimmed off. Fish and seafood. Egg whites. Dried beans, peas, or lentils. Unsalted nuts, nut butters, and seeds. Unsalted canned beans. Lean cuts of beef with fat trimmed off. Low-sodium, lean deli meat. Dairy Low-fat (1%) or fat-free (skim) milk. Fat-free, low-fat, or reduced-fat cheeses. Nonfat, low-sodium ricotta or cottage cheese. Low-fat or nonfat yogurt. Low-fat, low-sodium cheese. Fats and oils Soft margarine without trans fats. Vegetable oil. Low-fat, reduced-fat, or light mayonnaise and salad dressings (reduced-sodium). Canola, safflower, olive, soybean, and sunflower oils. Avocado. Seasoning and other foods Herbs. Spices. Seasoning mixes without salt. Unsalted popcorn and pretzels. Fat-free sweets. What foods are not recommended? The items listed may not be a complete list. Talk with your dietitian about what dietary choices are best for you. Grains Baked goods made with fat, such as croissants, muffins, or some breads. Dry pasta or rice meal packs. Vegetables Creamed or fried vegetables. Vegetables in a cheese sauce. Regular canned vegetables (not low-sodium or reduced-sodium). Regular canned tomato sauce and paste (not low-sodium or reduced-sodium). Regular tomato and vegetable juice (not low-sodium or reduced-sodium). Pickles. Olives. Fruits Canned fruit in a light or heavy syrup. Fried fruit. Fruit in cream or butter sauce. Meat and other protein foods Fatty cuts of meat. Ribs. Fried meat. Bacon. Sausage. Bologna and other processed lunch meats. Salami. Fatback. Hotdogs. Bratwurst. Salted nuts and seeds. Canned beans with added salt. Canned or smoked fish. Whole eggs or egg yolks. Chicken or turkey with skin. Dairy Whole or 2% milk, cream, and half-and-half. Whole or full-fat cream cheese. Whole-fat or sweetened yogurt. Full-fat cheese. Nondairy creamers. Whipped toppings.  Processed cheese and cheese spreads. Fats and oils Butter. Stick margarine. Lard. Shortening. Ghee. Bacon fat. Tropical oils, such as coconut, palm kernel, or palm oil. Seasoning and other foods Salted popcorn and pretzels. Onion salt, garlic salt, seasoned salt, table salt, and sea salt. Worcestershire sauce. Tartar sauce. Barbecue sauce. Teriyaki sauce. Soy sauce, including reduced-sodium. Steak sauce. Canned and packaged gravies. Fish sauce. Oyster sauce. Cocktail sauce. Horseradish that you find on the shelf. Ketchup. Mustard. Meat flavorings and tenderizers. Bouillon cubes. Hot sauce and Tabasco sauce. Premade or packaged marinades. Premade or packaged taco seasonings. Relishes. Regular salad dressings. Where to find more information:  National Heart, Lung, and Blood Institute: www.nhlbi.nih.gov  American Heart Association: www.heart.org Summary  The DASH eating plan is a healthy eating plan that has been shown to reduce high blood pressure (hypertension). It may also reduce your risk for type 2 diabetes, heart disease, and stroke.  With the DASH eating plan, you should limit salt (sodium) intake to 2,300 mg a day. If you have hypertension, you may need to reduce your sodium intake to 1,500 mg a day.  When on the DASH eating plan, aim to eat more fresh fruits and vegetables, whole grains, lean proteins, low-fat dairy, and heart-healthy fats.  Work with your health care provider or diet and nutrition specialist (dietitian) to adjust your eating plan to your   individual calorie needs. This information is not intended to replace advice given to you by your health care provider. Make sure you discuss any questions you have with your health care provider. Document Released: 05/19/2011 Document Revised: 05/12/2017 Document Reviewed: 05/23/2016 Elsevier Patient Education  2020 Advance Headache Without Cause A headache is pain or discomfort felt around the head or neck  area. The specific cause of a headache may not be found. There are many causes and types of headaches. A few common ones are:  Tension headaches.  Migraine headaches.  Cluster headaches.  Chronic daily headaches. Follow these instructions at home: Watch your condition for any changes. Let your health care provider know about them. Take these steps to help with your condition: Managing pain      Take over-the-counter and prescription medicines only as told by your health care provider.  Lie down in a dark, quiet room when you have a headache.  If directed, put ice on your head and neck area: ? Put ice in a plastic bag. ? Place a towel between your skin and the bag. ? Leave the ice on for 20 minutes, 2-3 times per day.  If directed, apply heat to the affected area. Use the heat source that your health care provider recommends, such as a moist heat pack or a heating pad. ? Place a towel between your skin and the heat source. ? Leave the heat on for 20-30 minutes. ? Remove the heat if your skin turns bright red. This is especially important if you are unable to feel pain, heat, or cold. You may have a greater risk of getting burned.  Keep lights dim if bright lights bother you or make your headaches worse. Eating and drinking  Eat meals on a regular schedule.  If you drink alcohol: ? Limit how much you use to:  0-1 drink a day for women.  0-2 drinks a day for men. ? Be aware of how much alcohol is in your drink. In the [place] one drink equals one 12 oz bottle of beer (355 mL), one 5 oz glass of wine (148 mL), or one 1 oz glass of hard liquor (44 mL).  Stop drinking caffeine, or decrease the amount of caffeine you drink. General instructions   Keep a headache journal to help find out what may trigger your headaches. For example, write down: ? What you eat and drink. ? How much sleep you get. ? Any change to your diet or medicines.  Try massage or other relaxation  techniques.  Limit stress.  Sit up straight, and do not tense your muscles.  Do not use any products that contain nicotine or tobacco, such as cigarettes, e-cigarettes, and chewing tobacco. If you need help quitting, ask your health care provider.  Exercise regularly as told by your health care provider.  Sleep on a regular schedule. Get 7-9 hours of sleep each night, or the amount recommended by your health care provider.  Keep all follow-up visits as told by your health care provider. This is important. Contact a health care provider if:  Your symptoms are not helped by medicine.  You have a headache that is different from the usual headache.  You have nausea or you vomit.  You have a fever. Get help right away if:  Your headache becomes severe quickly.  Your headache gets worse after moderate to intense physical activity.  You have repeated vomiting.  You have a stiff  neck.  You have a loss of vision.  You have problems with speech.  You have pain in the eye or ear.  You have muscular weakness or loss of muscle control.  You lose your balance or have trouble walking.  You feel faint or pass out.  You have confusion.  You have a seizure. Summary  A headache is pain or discomfort felt around the head or neck area.  There are many causes and types of headaches. In some cases, the cause may not be found.  Keep a headache journal to help find out what may trigger your headaches. Watch your condition for any changes. Let your health care provider know about them.  Contact a health care provider if you have a headache that is different from the usual headache, or if your symptoms are not helped by medicine.  Get help right away if your headache becomes severe, you vomit, you have a loss of vision, you lose your balance, or you have a seizure. This information is not intended to replace advice given to you by your health care provider. Make sure you discuss any  questions you have with your health care provider. Document Released: 05/30/2005 Document Revised: 12/18/2017 Document Reviewed: 12/18/2017 Elsevier Patient Education  2020 [employer].

## 2019-04-26 NOTE — Assessment & Plan Note (Signed)
?   Running out of paxil contributed Blurry vision is worrisome--  Check CT head

## 2019-04-26 NOTE — Assessment & Plan Note (Signed)
paxil restarted 2 days ago Should improve with time

## 2019-07-07 ENCOUNTER — Telehealth: Payer: Medicare HMO | Admitting: Family

## 2019-07-07 ENCOUNTER — Encounter (INDEPENDENT_AMBULATORY_CARE_PROVIDER_SITE_OTHER): Payer: Self-pay

## 2019-07-07 DIAGNOSIS — Z20822 Contact with and (suspected) exposure to covid-19: Secondary | ICD-10-CM

## 2019-07-07 MED ORDER — BENZONATATE 100 MG PO CAPS: 100.0000 mg | ORAL_CAPSULE | Freq: Three times a day (TID) | ORAL | 0 refills | Status: DC | PRN

## 2019-07-07 NOTE — Progress Notes (Signed)
E-Visit for Corona Virus Screening  Your current symptoms could be consistent with the coronavirus.  Many health care providers can now test patients at their office but not all are.  [facility] has multiple testing sites. For information on our COVID testing locations and hours go to [email or web address]  We are enrolling you in our MyChart Home Monitoring for COVID19 . Daily you will receive a questionnaire within the MyChart website. Our COVID 19 response team will be monitoring your responses daily.  Testing Information: The COVID-19 Community Testing sites will begin testing BY APPOINTMENT ONLY.  You can schedule online at [email or web address]  If you do not have access to a smart phone or computer you may call [phone] for an appointment.   Additional testing sites in the Community:  . For [facility] Testing sites in [place]  https://www.cvs.com/minuteclinic/covid-19-testing  . For Pop-up testing sites in   https://covid19.ncdhhs.gov/about-covid-19/testing/find-my-testing-place/pop-testing-sites  . For Testing sites with regular hours https://onsms.[email or web address]/[email or web address]  . For [place] [facility] [email or web address]://[email or web address]-community-[email or web address]  . For [facility] https://[place].gov[place]/human-services/health-department/coronavirus-covid-19-info/covid-19-testing  . For [place] testing in  and [place] https://[place].gov[place]/human-services/health-department/coronavirus-covid-19-info/covid-19-testing  . For [facility] testing in [place]   https://[facility].care/[email or web address]  For  more information about community testing call [phone]   Please quarantine yourself while awaiting your test results. Please stay home for a minimum of 10 days from the first day of illness with improving symptoms and you have had 24 hours of no fever (without the use of Tylenol (Acetaminophen)  Motrin (Ibuprofen) or any fever reducing medication).  Also - Do not get tested prior to returning to work because once you have had a positive test the test can stay positive for more then a month in some cases.   You should wear a mask or cloth face covering over your nose and mouth if you must be around other people or animals, including pets (even at home). Try to stay at least 6 feet away from other people. This will protect the people around you.  Please continue good preventive care measures, including:  frequent hand-washing, avoid touching your face, cover coughs/sneezes, stay out of crowds and keep a 6 foot distance from others.  COVID-19 is a respiratory illness with symptoms that are similar to the flu. Symptoms are typically mild to moderate, but there have been cases of severe illness and death due to the virus.   The following symptoms may appear 2-14 days after exposure: . Fever . Cough . Shortness of breath or difficulty breathing . Chills . Repeated shaking with chills . Muscle pain . Headache . Sore throat . New loss of taste or smell . Fatigue . Congestion or runny nose . Nausea or vomiting . Diarrhea  Go to the nearest hospital ED for assessment if fever/cough/breathlessness are severe or illness seems like a threat to life.  It is vitally important that if you feel that you have an infection such as this virus or any other virus that you stay home and away from places where you may spread it to others.  You should avoid contact with people age 65 and older.   You can use medication such as A prescription cough medication called Tessalon Perles 100 mg. You may take 1-2 capsules every 8 hours as needed for cough.  You may also take acetaminophen (Tylenol) as needed for fever.  Reduce your risk of any infection by using the same precautions used for avoiding the common cold or flu:  . Wash your hands   often with soap and warm water for at least 20 seconds.  If soap and  water are not readily available, use an alcohol-based hand sanitizer with at least 60% alcohol.  . If coughing or sneezing, cover your mouth and nose by coughing or sneezing into the elbow areas of your shirt or coat, into a tissue or into your sleeve (not your hands). . Avoid shaking hands with others and consider head nods or verbal greetings only. . Avoid touching your eyes, nose, or mouth with unwashed hands.  . Avoid close contact with people who are sick. . Avoid places or events with large numbers of people in one location, like concerts or sporting events. . Carefully consider travel plans you have or are making. . If you are planning any travel outside or inside the [place], visit the CDC's Travelers' Health webpage for the latest health notices. . If you have some symptoms but not all symptoms, continue to monitor at home and seek medical attention if your symptoms worsen. . If you are having a medical emergency, call 911.  HOME CARE . Only take medications as instructed by your medical team. . Drink plenty of fluids and get plenty of rest. . A steam or ultrasonic humidifier can help if you have congestion.   GET HELP RIGHT AWAY IF YOU HAVE EMERGENCY WARNING SIGNS** FOR COVID-19. If you or someone is showing any of these signs seek emergency medical care immediately. Call 911 or proceed to your closest emergency facility if: . You develop worsening high fever. . Trouble breathing . Bluish lips or face . Persistent pain or pressure in the chest . New confusion . Inability to wake or stay awake . You cough up blood. . Your symptoms become more severe  **This list is not all possible symptoms. Contact your medical provider for any symptoms that are sever or concerning to you.  MAKE SURE YOU   Understand these instructions.  Will watch your condition.  Will get help right away if you are not doing well or get worse.  Your e-visit answers were reviewed by a board certified  advanced clinical practitioner to complete your personal care plan.  Depending on the condition, your plan could have included both over the counter or prescription medications.  If there is a problem please reply once you have received a response from your provider.  Your safety is important to [place].  If you have drug allergies check your prescription carefully.    You can use MyChart to ask questions about today's visit, request a non-urgent call back, or ask for a work or school excuse for 24 hours related to this e-Visit. If it has been greater than 24 hours you will need to follow up with your provider, or enter a new e-Visit to address those concerns. You will get an e-mail in the next two days asking about your experience.  I hope that your e-visit has been valuable and will speed your recovery. Thank you for using e-visits.  Approximately 5 minutes was spent documenting and reviewing patient's chart.

## 2019-07-08 ENCOUNTER — Encounter (INDEPENDENT_AMBULATORY_CARE_PROVIDER_SITE_OTHER): Payer: Self-pay

## 2019-07-09 ENCOUNTER — Encounter (INDEPENDENT_AMBULATORY_CARE_PROVIDER_SITE_OTHER): Payer: Self-pay

## 2019-07-09 ENCOUNTER — Encounter: Payer: Self-pay | Admitting: Family Medicine

## 2019-07-10 ENCOUNTER — Encounter: Payer: Self-pay | Admitting: *Deleted

## 2019-07-10 ENCOUNTER — Encounter (INDEPENDENT_AMBULATORY_CARE_PROVIDER_SITE_OTHER): Payer: Self-pay

## 2019-07-11 ENCOUNTER — Encounter: Payer: Self-pay | Admitting: Family Medicine

## 2019-07-11 ENCOUNTER — Encounter (INDEPENDENT_AMBULATORY_CARE_PROVIDER_SITE_OTHER): Payer: Self-pay

## 2019-07-12 ENCOUNTER — Encounter (INDEPENDENT_AMBULATORY_CARE_PROVIDER_SITE_OTHER): Payer: Self-pay

## 2019-07-13 ENCOUNTER — Encounter (INDEPENDENT_AMBULATORY_CARE_PROVIDER_SITE_OTHER): Payer: Self-pay

## 2019-07-17 ENCOUNTER — Encounter: Payer: Self-pay | Admitting: Family Medicine

## 2019-07-17 ENCOUNTER — Encounter (INDEPENDENT_AMBULATORY_CARE_PROVIDER_SITE_OTHER): Payer: Self-pay

## 2019-07-17 NOTE — Telephone Encounter (Signed)
Diarrhea is a common side effect to the vaccine so that very well could be where its coming from

## 2019-07-18 ENCOUNTER — Encounter (INDEPENDENT_AMBULATORY_CARE_PROVIDER_SITE_OTHER): Payer: Self-pay

## 2019-07-19 ENCOUNTER — Encounter (INDEPENDENT_AMBULATORY_CARE_PROVIDER_SITE_OTHER): Payer: Self-pay

## 2019-07-20 ENCOUNTER — Encounter (INDEPENDENT_AMBULATORY_CARE_PROVIDER_SITE_OTHER): Payer: Self-pay

## 2019-07-24 DIAGNOSIS — R0981 Nasal congestion: Secondary | ICD-10-CM | POA: Diagnosis not present

## 2019-07-24 DIAGNOSIS — Z20822 Contact with and (suspected) exposure to covid-19: Secondary | ICD-10-CM | POA: Diagnosis not present

## 2019-07-24 DIAGNOSIS — R438 Other disturbances of smell and taste: Secondary | ICD-10-CM | POA: Diagnosis not present

## 2019-07-24 DIAGNOSIS — R197 Diarrhea, unspecified: Secondary | ICD-10-CM | POA: Diagnosis not present

## 2019-08-05 ENCOUNTER — Encounter: Payer: Self-pay | Admitting: Family Medicine

## 2019-09-04 DIAGNOSIS — H02834 Dermatochalasis of left upper eyelid: Secondary | ICD-10-CM | POA: Diagnosis not present

## 2019-09-04 DIAGNOSIS — H02831 Dermatochalasis of right upper eyelid: Secondary | ICD-10-CM | POA: Diagnosis not present

## 2019-10-08 DIAGNOSIS — Z9882 Breast implant status: Secondary | ICD-10-CM | POA: Diagnosis not present

## 2019-10-08 DIAGNOSIS — Z79890 Hormone replacement therapy (postmenopausal): Secondary | ICD-10-CM | POA: Diagnosis not present

## 2019-10-08 DIAGNOSIS — Z79899 Other long term (current) drug therapy: Secondary | ICD-10-CM | POA: Diagnosis not present

## 2019-10-08 DIAGNOSIS — H02834 Dermatochalasis of left upper eyelid: Secondary | ICD-10-CM | POA: Diagnosis not present

## 2019-10-08 DIAGNOSIS — M1990 Unspecified osteoarthritis, unspecified site: Secondary | ICD-10-CM | POA: Diagnosis not present

## 2019-10-08 DIAGNOSIS — H02831 Dermatochalasis of right upper eyelid: Secondary | ICD-10-CM | POA: Diagnosis not present

## 2019-10-08 DIAGNOSIS — M1230 Palindromic rheumatism, unspecified site: Secondary | ICD-10-CM | POA: Diagnosis not present

## 2019-11-06 DIAGNOSIS — Z0100 Encounter for examination of eyes and vision without abnormal findings: Secondary | ICD-10-CM | POA: Diagnosis not present

## 2019-11-06 DIAGNOSIS — H524 Presbyopia: Secondary | ICD-10-CM | POA: Diagnosis not present

## 2019-11-07 DIAGNOSIS — Z1231 Encounter for screening mammogram for malignant neoplasm of breast: Secondary | ICD-10-CM | POA: Diagnosis not present

## 2019-12-10 ENCOUNTER — Ambulatory Visit (INDEPENDENT_AMBULATORY_CARE_PROVIDER_SITE_OTHER): Payer: Medicare HMO | Admitting: Family Medicine

## 2019-12-10 ENCOUNTER — Other Ambulatory Visit: Payer: Self-pay

## 2019-12-10 ENCOUNTER — Encounter: Payer: Self-pay | Admitting: Family Medicine

## 2019-12-10 VITALS — BP 118/70 | HR 68 | Temp 97.1°F | Resp 18 | Ht 60.0 in | Wt 129.4 lb

## 2019-12-10 DIAGNOSIS — M545 Low back pain, unspecified: Secondary | ICD-10-CM

## 2019-12-10 DIAGNOSIS — M069 Rheumatoid arthritis, unspecified: Secondary | ICD-10-CM | POA: Diagnosis not present

## 2019-12-10 DIAGNOSIS — R8290 Unspecified abnormal findings in urine: Secondary | ICD-10-CM

## 2019-12-10 DIAGNOSIS — R319 Hematuria, unspecified: Secondary | ICD-10-CM

## 2019-12-10 DIAGNOSIS — N390 Urinary tract infection, site not specified: Secondary | ICD-10-CM | POA: Diagnosis not present

## 2019-12-10 LAB — POC URINALSYSI DIPSTICK (AUTOMATED)
Bilirubin, UA: NEGATIVE
Glucose, UA: NEGATIVE
Ketones, UA: NEGATIVE
Nitrite, UA: POSITIVE
Protein, UA: NEGATIVE
Spec Grav, UA: 1.015 (ref 1.010–1.025)
Urobilinogen, UA: 1 E.U./dL
pH, UA: 5.5 (ref 5.0–8.0)

## 2019-12-10 MED ORDER — CEPHALEXIN 500 MG PO CAPS: 500.0000 mg | ORAL_CAPSULE | Freq: Two times a day (BID) | ORAL | 0 refills | Status: DC

## 2019-12-10 NOTE — Progress Notes (Signed)
Patient ID: Stephanie Lara, female    DOB: 1946-01-30  Age: 74 y.o. MRN: [id number]    Subjective:  Subjective  HPI [name] presents for uti symptoms---- it started last Thursday with upset stomach and back pain --- + low abd pain  No urinary frequency or dysuria.  No fevers.    Pt taking tylenol     Has a hx of a kidney stone-- this feels different.     Review of Systems  Constitutional: Negative for appetite change, diaphoresis, fatigue and unexpected weight change.  Eyes: Negative for pain, redness and visual disturbance.  Respiratory: Negative for cough, chest tightness, shortness of breath and wheezing.   Cardiovascular: Negative for chest pain, palpitations and leg swelling.  Endocrine: Negative for cold intolerance, heat intolerance, polydipsia, polyphagia and polyuria.  Genitourinary: Positive for flank pain and frequency. Negative for decreased urine volume, difficulty urinating, dysuria, hematuria, menstrual problem, pelvic pain, urgency, vaginal bleeding, vaginal discharge and vaginal pain.  Neurological: Negative for dizziness, light-headedness, numbness and headaches.    History Past Medical History:  Diagnosis Date  . Chicken pox   . Hyperlipidemia   . Rheumatoid arthritis ([place])     She has a past surgical history that includes Lipoma excision; Rhinoplasty; Septoplasty; Breast biopsy; Placement of breast implants; and Abdominoplasty.   Her family history includes Deep vein thrombosis in her father; Diabetes in her paternal grandmother; Gallbladder disease in her father; Heart attack in her father; Heart disease in her father; Hypertension in her brother and mother; Sudden death in her father.She reports that she has never smoked. She has never used smokeless tobacco. She reports current alcohol use. She reports that she does not use drugs.  Current Outpatient Medications on File Prior to Visit  Medication Sig Dispense Refill  . Ascorbic Acid (VITAMIN C) 1000 MG  tablet Take 1,000 mg by mouth daily.    . B Complex-C (SUPER B COMPLEX PO) Take 1 tablet by mouth daily.    . Calcium Carbonate-Vitamin D (CALCIUM 500 + D PO) Take 1 capsule by mouth daily.    . Coenzyme Q10 (CO Q 10 PO) Take 200 mg by mouth.    Marland Kitchen LATISSE 0.03 % ophthalmic solution     . Mag Aspart-Potassium Aspart (POTASSIUM & MAGNESIUM ASPARTAT PO) Take 250 mg by mouth daily.    . Magnesium 300 MG CAPS Take 1 capsule by mouth daily.    . meloxicam (MOBIC) 15 MG tablet Take 1 tablet (15 mg total) by mouth daily. 90 tablet 1  . Omega-3 Fatty Acids (FISH OIL PO) Take 300 mg by mouth 2 (two) times daily.    Marland Kitchen PARoxetine (PAXIL) 10 MG tablet     . Red Yeast Rice 600 MG CAPS Take by mouth.    . traZODone (DESYREL) 50 MG tablet Take 25 mg by mouth at bedtime.    . Turmeric Curcumin 500 MG CAPS Take 1 capsule by mouth daily.    Marland Kitchen VITAMIN E PO Take 450 mg by mouth 2 (two) times daily.    . benzonatate (TESSALON PERLES) 100 MG capsule Take 1 capsule (100 mg total) by mouth 3 (three) times daily as needed. (Patient not taking: Reported on 12/10/2019) 20 capsule 0  . meclizine (ANTIVERT) 25 MG tablet Take 1 tablet (25 mg total) by mouth 3 (three) times daily as needed for dizziness. (Patient not taking: Reported on 12/10/2019) 30 tablet 0   No current facility-administered medications on file prior to visit.  Objective:  Objective  Physical Exam Vitals and nursing note reviewed.  Constitutional:      Appearance: She is well-developed.  HENT:     Head: Normocephalic and atraumatic.  Eyes:     Conjunctiva/sclera: Conjunctivae normal.  Neck:     Thyroid: No thyromegaly.     Vascular: No carotid bruit or JVD.  Cardiovascular:     Rate and Rhythm: Normal rate and regular rhythm.     Heart sounds: Normal heart sounds. No murmur heard.   Pulmonary:     Effort: Pulmonary effort is normal. No respiratory distress.     Breath sounds: Normal breath sounds. No wheezing or rales.  Chest:     Chest  wall: No tenderness.  Musculoskeletal:     Cervical back: Normal range of motion and neck supple.  Neurological:     Mental Status: She is alert and oriented to person, place, and time.    BP 118/70 (BP Location: Left Arm, Patient Position: Sitting, Cuff Size: Normal)   Pulse 68   Temp (!) 97.1 F (36.2 C) (Temporal)   Resp 18   Ht 5' (1.524 m)   Wt 129 lb 6.4 oz (58.7 kg)   SpO2 98%   BMI 25.27 kg/m  Wt Readings from Last 3 Encounters:  12/10/19 129 lb 6.4 oz (58.7 kg)  04/26/19 129 lb 6.4 oz (58.7 kg)  04/04/19 132 lb (59.9 kg)     Lab Results  Component Value Date   WBC 6.4 06/21/2018   HGB 14.6 06/21/2018   HCT 43.3 06/21/2018   PLT 270.0 06/21/2018   GLUCOSE 81 04/04/2019   CHOL 244 (H) 04/04/2019   TRIG 53.0 04/04/2019   HDL 72.60 04/04/2019   LDLCALC 161 (H) 04/04/2019   ALT 19 04/04/2019   AST 23 04/04/2019   NA 141 04/04/2019   K 4.2 04/04/2019   CL 104 04/04/2019   CREATININE 0.73 04/04/2019   BUN 28 (H) 04/04/2019   CO2 32 04/04/2019   TSH 2.38 06/21/2018    CT Head Wo Contrast  Result Date: 04/26/2019 CLINICAL DATA:  Headache.  Vision changes.  Elevated blood pressure. EXAM: CT HEAD WITHOUT CONTRAST TECHNIQUE: Contiguous axial images were obtained from the base of the skull through the vertex without intravenous contrast. COMPARISON:  None. FINDINGS: Brain: No mass lesion, hemorrhage, hydrocephalus, acute infarct, intra-axial, or extra-axial fluid collection. Vascular: Intracranial atherosclerosis. Skull: Normal Sinuses/Orbits: Normal imaged portions of the orbits and globes. Clear paranasal sinuses and mastoid air cells. Other: None. IMPRESSION: Normal head CT. Electronically Signed   By: Abigail Miyamoto M.D.   On: 04/26/2019 12:31     Assessment & Plan:  Plan  I am having [name] start on cephALEXin. I am also having her maintain her Latisse, Turmeric Curcumin, Calcium Carbonate-Vitamin D (CALCIUM 500 + D PO), B Complex-C (SUPER B COMPLEX PO),  VITAMIN E PO, Coenzyme Q10 (CO Q 10 PO), vitamin C, Magnesium, Omega-3 Fatty Acids (FISH OIL PO), Red Yeast Rice, Mag Aspart-Potassium Aspart (POTASSIUM & MAGNESIUM ASPARTAT PO), traZODone, meclizine, PARoxetine, meloxicam, and benzonatate.  Meds ordered this encounter  Medications  . cephALEXin (KEFLEX) 500 MG capsule    Sig: Take 1 capsule (500 mg total) by mouth 2 (two) times daily.    Dispense:  14 capsule    Refill:  0    Problem List Items Addressed This Visit    None    Visit Diagnoses    Acute midline low back pain, unspecified whether sciatica present    -  Primary   Relevant Orders   POCT Urinalysis Dipstick (Automated) (Completed)   Abnormal urine       Relevant Orders   Urine Culture   Urinary tract infection with hematuria, site unspecified       Relevant Medications   cephALEXin (KEFLEX) 500 MG capsule      Follow-up: Return if symptoms worsen or fail to improve.  Ann Held, DO

## 2019-12-10 NOTE — Patient Instructions (Signed)

## 2019-12-11 LAB — URINE CULTURE
MICRO NUMBER:: 10647026
SPECIMEN QUALITY:: ADEQUATE

## 2019-12-12 ENCOUNTER — Telehealth: Payer: Self-pay | Admitting: Family Medicine

## 2019-12-12 NOTE — Telephone Encounter (Signed)
Caller Tmya Wigington  Call Back # [phone]  Patient is requesting a call back from [facility] in regards to lab results (urine culture)  . Per patient she does not understand the results. Patient also states not feeling any better .  Please advise

## 2019-12-13 ENCOUNTER — Other Ambulatory Visit: Payer: Self-pay

## 2019-12-13 ENCOUNTER — Other Ambulatory Visit (INDEPENDENT_AMBULATORY_CARE_PROVIDER_SITE_OTHER): Payer: Medicare HMO

## 2019-12-13 DIAGNOSIS — R319 Hematuria, unspecified: Secondary | ICD-10-CM

## 2019-12-13 DIAGNOSIS — N390 Urinary tract infection, site not specified: Secondary | ICD-10-CM

## 2019-12-13 NOTE — Telephone Encounter (Signed)
Pt scheduled for lab at [facility] for urine culture

## 2019-12-14 LAB — URINE CULTURE
MICRO NUMBER:: 10663259
Result:: NO GROWTH
SPECIMEN QUALITY:: ADEQUATE

## 2019-12-17 ENCOUNTER — Telehealth: Payer: Self-pay | Admitting: Family Medicine

## 2019-12-17 NOTE — Telephone Encounter (Signed)
Patient called regarding gallbladder , pain below right breast , Eating is un settling , pain runs across rib cage . This has been going on for 4 weeks . Patient  Is set to see a digestive health specialist Friday . Please call back to advise . [phone]

## 2019-12-18 DIAGNOSIS — Z800 Family history of malignant neoplasm of digestive organs: Secondary | ICD-10-CM | POA: Diagnosis not present

## 2019-12-18 DIAGNOSIS — R1011 Right upper quadrant pain: Secondary | ICD-10-CM | POA: Diagnosis not present

## 2019-12-18 DIAGNOSIS — R140 Abdominal distension (gaseous): Secondary | ICD-10-CM | POA: Diagnosis not present

## 2019-12-18 DIAGNOSIS — R112 Nausea with vomiting, unspecified: Secondary | ICD-10-CM | POA: Diagnosis not present

## 2019-12-18 NOTE — Telephone Encounter (Signed)
Pt called. VM left with recommendations.

## 2019-12-25 DIAGNOSIS — Z800 Family history of malignant neoplasm of digestive organs: Secondary | ICD-10-CM | POA: Diagnosis not present

## 2019-12-25 DIAGNOSIS — R112 Nausea with vomiting, unspecified: Secondary | ICD-10-CM | POA: Diagnosis not present

## 2019-12-25 DIAGNOSIS — K3189 Other diseases of stomach and duodenum: Secondary | ICD-10-CM | POA: Diagnosis not present

## 2019-12-25 DIAGNOSIS — R109 Unspecified abdominal pain: Secondary | ICD-10-CM | POA: Diagnosis not present

## 2019-12-25 DIAGNOSIS — K6389 Other specified diseases of intestine: Secondary | ICD-10-CM | POA: Diagnosis not present

## 2019-12-25 DIAGNOSIS — K5900 Constipation, unspecified: Secondary | ICD-10-CM | POA: Diagnosis not present

## 2020-01-01 DIAGNOSIS — K259 Gastric ulcer, unspecified as acute or chronic, without hemorrhage or perforation: Secondary | ICD-10-CM | POA: Diagnosis not present

## 2020-01-01 DIAGNOSIS — R1011 Right upper quadrant pain: Secondary | ICD-10-CM | POA: Diagnosis not present

## 2020-01-01 DIAGNOSIS — K3189 Other diseases of stomach and duodenum: Secondary | ICD-10-CM | POA: Diagnosis not present

## 2020-01-01 DIAGNOSIS — Z1211 Encounter for screening for malignant neoplasm of colon: Secondary | ICD-10-CM | POA: Diagnosis not present

## 2020-01-01 DIAGNOSIS — Z800 Family history of malignant neoplasm of digestive organs: Secondary | ICD-10-CM | POA: Diagnosis not present

## 2020-01-01 DIAGNOSIS — K5730 Diverticulosis of large intestine without perforation or abscess without bleeding: Secondary | ICD-10-CM | POA: Diagnosis not present

## 2020-01-14 DIAGNOSIS — K760 Fatty (change of) liver, not elsewhere classified: Secondary | ICD-10-CM | POA: Diagnosis not present

## 2020-01-14 DIAGNOSIS — R1011 Right upper quadrant pain: Secondary | ICD-10-CM | POA: Diagnosis not present

## 2020-01-14 DIAGNOSIS — R933 Abnormal findings on diagnostic imaging of other parts of digestive tract: Secondary | ICD-10-CM | POA: Diagnosis not present

## 2020-01-22 DIAGNOSIS — M069 Rheumatoid arthritis, unspecified: Secondary | ICD-10-CM | POA: Diagnosis not present

## 2020-01-22 DIAGNOSIS — R140 Abdominal distension (gaseous): Secondary | ICD-10-CM | POA: Diagnosis not present

## 2020-01-22 DIAGNOSIS — R1011 Right upper quadrant pain: Secondary | ICD-10-CM | POA: Diagnosis not present

## 2020-01-22 DIAGNOSIS — K5900 Constipation, unspecified: Secondary | ICD-10-CM | POA: Diagnosis not present

## 2020-01-24 DIAGNOSIS — R140 Abdominal distension (gaseous): Secondary | ICD-10-CM | POA: Diagnosis not present

## 2020-02-04 DIAGNOSIS — R1011 Right upper quadrant pain: Secondary | ICD-10-CM | POA: Diagnosis not present

## 2020-02-21 DIAGNOSIS — Z20822 Contact with and (suspected) exposure to covid-19: Secondary | ICD-10-CM | POA: Diagnosis not present

## 2020-02-21 DIAGNOSIS — R519 Headache, unspecified: Secondary | ICD-10-CM | POA: Diagnosis not present

## 2020-04-06 ENCOUNTER — Encounter: Payer: Self-pay | Admitting: Family Medicine

## 2020-04-06 ENCOUNTER — Ambulatory Visit (HOSPITAL_BASED_OUTPATIENT_CLINIC_OR_DEPARTMENT_OTHER)
Admission: RE | Admit: 2020-04-06 | Discharge: 2020-04-06 | Disposition: A | Discharge status: Home / Self Care | Payer: Medicare HMO | Source: Ambulatory Visit | Attending: Family Medicine | Admitting: Family Medicine

## 2020-04-06 ENCOUNTER — Other Ambulatory Visit: Payer: Self-pay | Admitting: Family Medicine

## 2020-04-06 ENCOUNTER — Ambulatory Visit (INDEPENDENT_AMBULATORY_CARE_PROVIDER_SITE_OTHER): Payer: Medicare HMO | Admitting: Family Medicine

## 2020-04-06 ENCOUNTER — Other Ambulatory Visit: Payer: Self-pay

## 2020-04-06 VITALS — BP 140/68 | HR 65 | Temp 97.7°F | Resp 18 | Ht 60.0 in | Wt 132.4 lb

## 2020-04-06 DIAGNOSIS — R8290 Unspecified abnormal findings in urine: Secondary | ICD-10-CM

## 2020-04-06 DIAGNOSIS — E785 Hyperlipidemia, unspecified: Secondary | ICD-10-CM | POA: Diagnosis not present

## 2020-04-06 DIAGNOSIS — R0781 Pleurodynia: Secondary | ICD-10-CM | POA: Diagnosis not present

## 2020-04-06 DIAGNOSIS — Z23 Encounter for immunization: Secondary | ICD-10-CM | POA: Diagnosis not present

## 2020-04-06 DIAGNOSIS — J984 Other disorders of lung: Secondary | ICD-10-CM | POA: Diagnosis not present

## 2020-04-06 DIAGNOSIS — Z0000 Encounter for general adult medical examination without abnormal findings: Secondary | ICD-10-CM | POA: Diagnosis not present

## 2020-04-06 DIAGNOSIS — R109 Unspecified abdominal pain: Secondary | ICD-10-CM | POA: Diagnosis not present

## 2020-04-06 LAB — POC URINALSYSI DIPSTICK (AUTOMATED)
Bilirubin, UA: NEGATIVE
Glucose, UA: NEGATIVE
Ketones, UA: NEGATIVE
Nitrite, UA: NEGATIVE
Protein, UA: POSITIVE — AB
Spec Grav, UA: >=1.03 — AB (ref 1.010–1.025)
Urobilinogen, UA: 0.2 E.U./dL
pH, UA: 5 (ref 5.0–8.0)

## 2020-04-06 MED ORDER — TIZANIDINE HCL 4 MG PO TABS: 4.0000 mg | ORAL_TABLET | Freq: Four times a day (QID) | ORAL | 0 refills | Status: DC | PRN

## 2020-04-06 MED FILL — tiZANidine HCL 4 MG TABS: 4 | 7 days supply | Qty: 30 | Fill #0

## 2020-04-06 NOTE — Patient Instructions (Signed)
Preventive Care 38 Years and Older, Female Preventive care refers to lifestyle choices and visits with your health care provider that can promote health and wellness. This includes:  A yearly physical exam. This is also called an annual well check.  Regular dental and eye exams.  Immunizations.  Screening for certain conditions.  Healthy lifestyle choices, such as diet and exercise. What can I expect for my preventive care visit? Physical exam Your health care provider will check:  Height and weight. These may be used to calculate body mass index (BMI), which is a measurement that tells if you are at a healthy weight.  Heart rate and blood pressure.  Your skin for abnormal spots. Counseling Your health care provider may ask you questions about:  Alcohol, tobacco, and drug use.  Emotional well-being.  Home and relationship well-being.  Sexual activity.  Eating habits.  History of falls.  Memory and ability to understand (cognition).  Work and work Statistician.  Pregnancy and menstrual history. What immunizations do I need?  Influenza (flu) vaccine  This is recommended every year. Tetanus, diphtheria, and pertussis (Tdap) vaccine  You may need a Td booster every 10 years. Varicella (chickenpox) vaccine  You may need this vaccine if you have not already been vaccinated. Zoster (shingles) vaccine  You may need this after age 73. Pneumococcal conjugate (PCV13) vaccine  One dose is recommended after age 73. Pneumococcal polysaccharide (PPSV23) vaccine  One dose is recommended after age 73. Measles, mumps, and rubella (MMR) vaccine  You may need at least one dose of MMR if you were born in 1957 or later. You may also need a second dose. Meningococcal conjugate (MenACWY) vaccine  You may need this if you have certain conditions. Hepatitis A vaccine  You may need this if you have certain conditions or if you travel or work in places where you may be exposed  to hepatitis A. Hepatitis B vaccine  You may need this if you have certain conditions or if you travel or work in places where you may be exposed to hepatitis B. Haemophilus influenzae type b (Hib) vaccine  You may need this if you have certain conditions. You may receive vaccines as individual doses or as more than one vaccine together in one shot (combination vaccines). Talk with your health care provider about the risks and benefits of combination vaccines. What tests do I need? Blood tests  Lipid and cholesterol levels. These may be checked every 5 years, or more frequently depending on your overall health.  Hepatitis C test.  Hepatitis B test. Screening  Lung cancer screening. You may have this screening every year starting at age 73 if you have a 30-pack-year history of smoking and currently smoke or have quit within the past 15 years.  Colorectal cancer screening. All adults should have this screening starting at age 73 and continuing until age 15. Your health care provider may recommend screening at age 73 if you are at increased risk. You will have tests every 1-10 years, depending on your results and the type of screening test.  Diabetes screening. This is done by checking your blood sugar (glucose) after you have not eaten for a while (fasting). You may have this done every 1-3 years.  Mammogram. This may be done every 1-2 years. Talk with your health care provider about how often you should have regular mammograms.  BRCA-related cancer screening. This may be done if you have a family history of breast, ovarian, tubal, or peritoneal cancers.  Other tests  Sexually transmitted disease (STD) testing.  Bone density scan. This is done to screen for osteoporosis. You may have this done starting at age 73. Follow these instructions at home: Eating and drinking  Eat a diet that includes fresh fruits and vegetables, whole grains, lean protein, and low-fat dairy products. Limit  your intake of foods with high amounts of sugar, saturated fats, and salt.  Take vitamin and mineral supplements as recommended by your health care provider.  Do not drink alcohol if your health care provider tells you not to drink.  If you drink alcohol: ? Limit how much you have to 0-1 drink a day. ? Be aware of how much alcohol is in your drink. In the [place] one drink equals one 12 oz bottle of beer (355 mL), one 5 oz glass of wine (148 mL), or one 1 oz glass of hard liquor (44 mL). Lifestyle  Take daily care of your teeth and gums.  Stay active. Exercise for at least 30 minutes on 5 or more days each week.  Do not use any products that contain nicotine or tobacco, such as cigarettes, e-cigarettes, and chewing tobacco. If you need help quitting, ask your health care provider.  If you are sexually active, practice safe sex. Use a condom or other form of protection in order to prevent STIs (sexually transmitted infections).  Talk with your health care provider about taking a low-dose aspirin or statin. What's next?  Go to your health care provider once a year for a well check visit.  Ask your health care provider how often you should have your eyes and teeth checked.  Stay up to date on all vaccines. This information is not intended to replace advice given to you by your health care provider. Make sure you discuss any questions you have with your health care provider. Document Revised: 05/24/2018 Document Reviewed: 05/24/2018 Elsevier Patient Education  2020 [employer].

## 2020-04-06 NOTE — Progress Notes (Signed)
Subjective:     Illona Lara is a 74 y.o. female and is here for a comprehensive physical exam. The patient reports problems - l rib pain for months--- she also has abd pain and has been to GI .  Social History   Socioeconomic History  . Marital status: Married    Spouse name: Not on file  . Number of children: Not on file  . Years of education: Not on file  . Highest education level: Not on file  Occupational History  . Occupation: retired Teacher, 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[employer]  Tobacco Use  . Smoking status: Never Smoker  . Smokeless tobacco: Never Used  Vaping Use  . Vaping Use: Never used  Substance and Sexual Activity  . Alcohol use: Yes    Alcohol/week: 0.0 standard drinks    Comment: Occ  . Drug use: No  . Sexual activity: Not on file  Other Topics Concern  . Not on file  Social History Narrative  . Not on file   Social Determinants of Health   Financial Resource Strain:   . Difficulty of Paying Living Expenses: Not on file  Food Insecurity:   . Worried About Charity fundraiser in the Last Year: Not on file  . Ran Out of Food in the Last Year: Not on file  Transportation Needs:   . Lack of Transportation (Medical): Not on file  . Lack of Transportation (Non-Medical): Not on file  Physical Activity:   . Days of Exercise per Week: Not on file  . Minutes of Exercise per Session: Not on file  Stress:   . Feeling of Stress : Not on file  Social Connections:   . Frequency of Communication with Friends and Family: Not on file  . Frequency of Social Gatherings with Friends and Family: Not on file  . Attends Religious Services: Not on file  . Active Member of Clubs or Organizations: Not on file  . Attends Archivist Meetings: Not on file  . Marital Status: Not on file  Intimate Partner Violence:   . Fear of Current or Ex-Partner: Not on file  . Emotionally Abused: Not on file  . Physically Abused: Not on file  . Sexually Abused: Not on file   Health  Maintenance  Topic Date Due  . TETANUS/TDAP  Never done  . INFLUENZA VACCINE  01/12/2020  . MAMMOGRAM  10/13/2021  . COLONOSCOPY  12/31/2029  . DEXA SCAN  Completed  . COVID-19 Vaccine  Completed  . Hepatitis C Screening  Completed  . PNA vac Low Risk Adult  Completed    The following portions of the patient's history were reviewed and updated as appropriate:  She  has a past medical history of Chicken pox, Hyperlipidemia, and Rheumatoid arthritis ([place]). She does not have any pertinent problems on file. She  has a past surgical history that includes Lipoma excision; Rhinoplasty; Septoplasty; Breast biopsy; Placement of breast implants; and Abdominoplasty. Her family history includes Deep vein thrombosis in her father; Diabetes in her paternal grandmother; Gallbladder disease in her father; Heart attack in her father; Heart disease in her father; Hypertension in her brother and mother; Sudden death in her father. She  reports that she has never smoked. She has never used smokeless tobacco. She reports current alcohol use. She reports that she does not use drugs. She has a current medication list which includes the following prescription(s): vitamin c, dicyclomine, latisse, linaclotide, meloxicam, pantoprazole, paroxetine, and trazodone. Current Outpatient Medications on File Prior  to Visit  Medication Sig Dispense Refill  . Ascorbic Acid (VITAMIN C) 1000 MG tablet Take 1,000 mg by mouth daily.    Marland Kitchen dicyclomine (BENTYL) 20 MG tablet     . LATISSE 0.03 % ophthalmic solution     . linaclotide (LINZESS) 72 MCG capsule     . meloxicam (MOBIC) 15 MG tablet Take 1 tablet (15 mg total) by mouth daily. 90 tablet 1  . pantoprazole (PROTONIX) 40 MG tablet Take by mouth.    Marland Kitchen PARoxetine (PAXIL) 10 MG tablet     . traZODone (DESYREL) 50 MG tablet Take 25 mg by mouth at bedtime.     No current facility-administered medications on file prior to visit.   She has No Known Allergies..  Review of  Systems Review of Systems  Constitutional: Negative for activity change, appetite change and fatigue.  HENT: Negative for hearing loss, congestion, tinnitus and ear discharge.  dentist q55m Eyes: Negative for visual disturbance (see optho q1y -- vision corrected to 20/20 with glasses).  Respiratory: Negative for cough, chest tightness and shortness of breath.   Cardiovascular: Negative for chest pain, palpitations and leg swelling.  Gastrointestinal: Negative , diarrhea, constipation  + abd pain  Genitourinary: Negative for urgency, frequency, decreased urine volume and difficulty urinating.  Musculoskeletal: Negative , arthralgias and gait problem.  + L flank pain Skin: Negative for color change, pallor and rash.  Neurological: Negative for dizziness, light-headedness, numbness and headaches.  Hematological: Negative for adenopathy. Does not bruise/bleed easily.  Psychiatric/Behavioral: Negative for suicidal ideas, confusion, sleep disturbance, self-injury, dysphoric mood, decreased concentration and agitation.       Objective:    BP 140/68 (BP Location: Right Arm, Patient Position: Sitting, Cuff Size: Normal)   Pulse 65   Temp 97.7 F (36.5 C) (Oral)   Resp 18   Ht 5' (1.524 m)   Wt 132 lb 6.4 oz (60.1 kg)   SpO2 99%   BMI 25.86 kg/m  General appearance: alert, cooperative, appears stated age and no distress Head: Normocephalic, without obvious abnormality, atraumatic Eyes: negative findings: lids and lashes normal, conjunctivae and sclerae normal and pupils equal, round, reactive to light and accomodation Ears: normal TM's and external ear canals both ears Neck: no adenopathy, no carotid bruit, no JVD, supple, symmetrical, trachea midline and thyroid not enlarged, symmetric, no tenderness/mass/nodules Back: symmetric, no curvature. ROM normal. No CVA tenderness. Lungs: clear to auscultation bilaterally Heart: regular rate and rhythm, S1, S2 normal, no murmur, click, rub or  gallop Abdomen: soft, non-tender; bowel sounds normal; no masses,  no organomegaly Pelvic: not indicated; post-menopausal, no abnormal Pap smears in past Extremities: extremities normal, atraumatic, no cyanosis or edema Pulses: 2+ and symmetric Skin: Skin color, texture, turgor normal. No rashes or lesions Lymph nodes: Cervical, supraclavicular, and axillary nodes normal. Neurologic: Alert and oriented X 3, normal strength and tone. Normal symmetric reflexes. Normal coordination and gait    Assessment:    Healthy female exam.      Plan:     ghm utd Check labs See After Visit Summary for Counseling Recommendations    1. Rib pain Check xray  Consider MS pain ----  PT ?  - DG Ribs Unilateral W/Chest Left; Future - tiZANidine (ZANAFLEX) 4 MG tablet; Take 1 tablet (4 mg total) by mouth every 6 (six) hours as needed for muscle spasms.  Dispense: 30 tablet; Refill: 0  2. Left flank pain Recheck urine and labs  - TSH - Lipid panel - CBC  with Differential/Platelet - Comprehensive metabolic panel - POCT Urinalysis Dipstick (Automated) - tiZANidine (ZANAFLEX) 4 MG tablet; Take 1 tablet (4 mg total) by mouth every 6 (six) hours as needed for muscle spasms.  Dispense: 30 tablet; Refill: 0  3. Preventative health care See above  - TSH - Lipid panel - CBC with Differential/Platelet - Comprehensive metabolic panel - POCT Urinalysis Dipstick (Automated)  4. Need for influenza vaccination   - Flu Vaccine QUAD High Dose(Fluad)

## 2020-04-07 LAB — LIPID PANEL
Cholesterol: 277 mg/dL — ABNORMAL HIGH (ref <200)
HDL: 68 mg/dL (ref >=50)
LDL Cholesterol (Calc): 190 mg/dL (calc) — ABNORMAL HIGH
Non-HDL Cholesterol (Calc): 209 mg/dL (calc) — ABNORMAL HIGH (ref <130)
Total CHOL/HDL Ratio: 4.1 (calc) (ref <5.0)
Triglycerides: 80 mg/dL (ref <150)

## 2020-04-07 LAB — COMPREHENSIVE METABOLIC PANEL
AG Ratio: 1.7 (calc) (ref 1.0–2.5)
ALT: 19 U/L (ref 6–29)
AST: 23 U/L (ref 10–35)
Albumin: 4.3 g/dL (ref 3.6–5.1)
Alkaline phosphatase (APISO): 55 U/L (ref 37–153)
BUN/Creatinine Ratio: 36 (calc) — ABNORMAL HIGH (ref 6–22)
BUN: 33 mg/dL — ABNORMAL HIGH (ref 7–25)
CO2: 28 mmol/L (ref 20–32)
Calcium: 9.6 mg/dL (ref 8.6–10.4)
Chloride: 103 mmol/L (ref 98–110)
Creat: 0.92 mg/dL (ref 0.60–0.93)
Globulin: 2.5 g/dL (calc) (ref 1.9–3.7)
Glucose, Bld: 67 mg/dL (ref 65–99)
Potassium: 4.2 mmol/L (ref 3.5–5.3)
Sodium: 140 mmol/L (ref 135–146)
Total Bilirubin: 0.8 mg/dL (ref 0.2–1.2)
Total Protein: 6.8 g/dL (ref 6.1–8.1)

## 2020-04-07 LAB — CBC WITH DIFFERENTIAL/PLATELET
Absolute Monocytes: 524 cells/uL (ref 200–950)
Basophils Absolute: 23 cells/uL (ref 0–200)
Basophils Relative: 0.4 %
Eosinophils Absolute: 63 cells/uL (ref 15–500)
Eosinophils Relative: 1.1 %
HCT: 38.8 % (ref 35.0–45.0)
Hemoglobin: 12.9 g/dL (ref 11.7–15.5)
Lymphs Abs: 1300 cells/uL (ref 850–3900)
MCH: 32.1 pg (ref 27.0–33.0)
MCHC: 33.2 g/dL (ref 32.0–36.0)
MCV: 96.5 fL (ref 80.0–100.0)
MPV: 9.7 fL (ref 7.5–12.5)
Monocytes Relative: 9.2 %
Neutro Abs: 3791 cells/uL (ref 1500–7800)
Neutrophils Relative %: 66.5 %
Platelets: 242 10*3/uL (ref 140–400)
RBC: 4.02 10*6/uL (ref 3.80–5.10)
RDW: 11.9 % (ref 11.0–15.0)
Total Lymphocyte: 22.8 %
WBC: 5.7 10*3/uL (ref 3.8–10.8)

## 2020-04-07 LAB — URINE CULTURE
MICRO NUMBER:: 11113759
SPECIMEN QUALITY:: ADEQUATE

## 2020-04-07 LAB — TSH: TSH: 1.55 mIU/L (ref 0.40–4.50)

## 2020-04-08 ENCOUNTER — Other Ambulatory Visit: Payer: Self-pay | Admitting: Family Medicine

## 2020-04-08 DIAGNOSIS — N888 Other specified noninflammatory disorders of cervix uteri: Secondary | ICD-10-CM | POA: Diagnosis not present

## 2020-04-08 DIAGNOSIS — R9389 Abnormal findings on diagnostic imaging of other specified body structures: Secondary | ICD-10-CM | POA: Diagnosis not present

## 2020-04-08 DIAGNOSIS — E785 Hyperlipidemia, unspecified: Secondary | ICD-10-CM

## 2020-04-08 DIAGNOSIS — R102 Pelvic and perineal pain: Secondary | ICD-10-CM | POA: Diagnosis not present

## 2020-04-08 DIAGNOSIS — R140 Abdominal distension (gaseous): Secondary | ICD-10-CM | POA: Diagnosis not present

## 2020-04-10 ENCOUNTER — Encounter: Payer: Self-pay | Admitting: Family Medicine

## 2020-10-28 DIAGNOSIS — H524 Presbyopia: Secondary | ICD-10-CM | POA: Diagnosis not present

## 2020-11-19 DIAGNOSIS — R1011 Right upper quadrant pain: Secondary | ICD-10-CM | POA: Diagnosis not present

## 2020-11-19 DIAGNOSIS — Z79890 Hormone replacement therapy (postmenopausal): Secondary | ICD-10-CM | POA: Diagnosis not present

## 2020-11-19 DIAGNOSIS — Z01419 Encounter for gynecological examination (general) (routine) without abnormal findings: Secondary | ICD-10-CM | POA: Diagnosis not present

## 2020-11-19 DIAGNOSIS — N905 Atrophy of vulva: Secondary | ICD-10-CM | POA: Diagnosis not present

## 2020-11-19 DIAGNOSIS — N952 Postmenopausal atrophic vaginitis: Secondary | ICD-10-CM | POA: Diagnosis not present

## 2020-12-09 DIAGNOSIS — I781 Nevus, non-neoplastic: Secondary | ICD-10-CM | POA: Diagnosis not present

## 2020-12-09 DIAGNOSIS — L570 Actinic keratosis: Secondary | ICD-10-CM | POA: Diagnosis not present

## 2020-12-09 DIAGNOSIS — L821 Other seborrheic keratosis: Secondary | ICD-10-CM | POA: Diagnosis not present

## 2020-12-22 ENCOUNTER — Ambulatory Visit (INDEPENDENT_AMBULATORY_CARE_PROVIDER_SITE_OTHER): Payer: Medicare HMO | Admitting: Medical

## 2020-12-22 ENCOUNTER — Other Ambulatory Visit: Payer: Self-pay

## 2020-12-22 VITALS — BP 130/70 | HR 72 | Temp 98.0°F | Resp 16 | Ht 60.0 in | Wt 130.4 lb

## 2020-12-22 DIAGNOSIS — R430 Anosmia: Secondary | ICD-10-CM

## 2020-12-22 DIAGNOSIS — Z8249 Family history of ischemic heart disease and other diseases of the circulatory system: Secondary | ICD-10-CM | POA: Diagnosis not present

## 2020-12-22 DIAGNOSIS — R5383 Other fatigue: Secondary | ICD-10-CM

## 2020-12-22 DIAGNOSIS — K219 Gastro-esophageal reflux disease without esophagitis: Secondary | ICD-10-CM | POA: Diagnosis not present

## 2020-12-22 LAB — POC URINALSYSI DIPSTICK (AUTOMATED)
Bilirubin, UA: NEGATIVE
Blood, UA: NEGATIVE
Glucose, UA: NEGATIVE
Ketones, UA: NEGATIVE
Leukocytes, UA: NEGATIVE
Nitrite, UA: NEGATIVE
Protein, UA: POSITIVE — AB
Spec Grav, UA: >=1.03 — AB (ref 1.010–1.025)
Urobilinogen, UA: 0.2 E.U./dL
pH, UA: 6 (ref 5.0–8.0)

## 2020-12-22 MED ORDER — PANTOPRAZOLE SODIUM 40 MG PO TBEC: 40.0000 mg | DELAYED_RELEASE_TABLET | Freq: Every day | ORAL | 0 refills | Status: DC

## 2020-12-22 NOTE — Patient Instructions (Addendum)
History of signs and symptoms one month ago that may have been covid. Level of fatigue along with loss of smell/taste suspicious. Offered igm and igg covid antibody studies. Studies declined so cancelled.  For severe fatigue cbc, cmp, tsh, t4, b12, b1, iron level and vit d.  Remote hx of rare reflux. Mild symptoms last night maybe recurrent. Making protonix available if were to occur.  With severe fatigue you report concern for possible cardiac cause. Very strong family history with risk factor high cholesterol. We went ahead and did ekg and one set troponin stat. Your ekg was nsr(normal sinus rhythm). Based on your concern and family history placed referral to cardiologist.   Decided to get urine culture as well to see if infection maybe cause for fatigue.  Follow up 7-10 days or as needed

## 2020-12-22 NOTE — Addendum Note (Signed)
Addended by: Kelle Darting A on: 12/22/2020 04:41 PM   Modules accepted: Orders

## 2020-12-22 NOTE — Progress Notes (Signed)
Subjective:    Patient ID: Stephanie Lara, female    DOB: [birth year], 75 y.o.   MRN: [id number]  HPI  Pt in for evaluation.  Pt had 2 covid vaccines in past. One time had covid and then in June she felt like may have had covid as well.  Pt states when she was sick in June lost her taste and smell for 2 weeks. Now her taste and smell is still off. She had ha, nasal congestion and fatigue. Lost voice. Never had bodyaches. This time she tested negative.    Pt she first got covid she had very mild but did loose her taste and smell at that time.   When pt got vaccinated first time she got covid about one week later.   Presently pt still feeling very fatigued. Sleeps 9 hours and can still feel very tired.  Bp 110-120/77.  Pt describes epigastric pain. Mostly burning like reflux in throat/neck. Then states family history of heart disease. Pt denies any overt chest pain or associated signs/symptoms. Pt does not have current heart burn. She states it is very rare. Only had 3 times in her life. In July was on protonix for 3 months.  2 sibings open heart surgery. 2 other mi.  Mom open heart surgery. Dad had heart attacks. Pt has no hx of smoking. Does have high cholesterol. Pt not on statin.     Review of Systems  Constitutional:  Positive for fatigue. Negative for chills and fever.  Respiratory:  Negative for cough, chest tightness, shortness of breath and wheezing.   Cardiovascular:  Negative for chest pain and palpitations.  Gastrointestinal:  Negative for abdominal pain.       Reflux type symptoms last night.  Genitourinary:  Negative for difficulty urinating, dysuria, enuresis, flank pain and frequency.  Musculoskeletal:  Negative for back pain.    Past Medical History:  Diagnosis Date   Chicken pox    Hyperlipidemia    Rheumatoid arthritis ([place])      Social History   Socioeconomic History   Marital status: Married    Spouse name: Not on file   Number of children: Not  on file   Years of education: Not on file   Highest education level: Not on file  Occupational History   Occupation: retired Teacher, 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[employer]  Tobacco Use   Smoking status: Never   Smokeless tobacco: Never  Vaping Use   Vaping Use: Never used  Substance and Sexual Activity   Alcohol use: Yes    Alcohol/week: 0.0 standard drinks    Comment: Occ   Drug use: No   Sexual activity: Not on file  Other Topics Concern   Not on file  Social History Narrative   Not on file   Social Determinants of Health   Financial Resource Strain: Not on file  Food Insecurity: Not on file  Transportation Needs: Not on file  Physical Activity: Not on file  Stress: Not on file  Social Connections: Not on file  Intimate Partner Violence: Not on file    Past Surgical History:  Procedure Laterality Date   ABDOMINOPLASTY     BREAST BIOPSY     LIPOMA EXCISION     neck   PLACEMENT OF BREAST IMPLANTS     RHINOPLASTY     SEPTOPLASTY      Family History  Problem Relation Age of Onset   Hypertension Mother    Heart disease Father    Sudden death Father  Heart attack Father    Gallbladder disease Father    Deep vein thrombosis Father    Diabetes Paternal Grandmother    Hypertension Brother     No Known Allergies  Current Outpatient Medications on File Prior to Visit  Medication Sig Dispense Refill   Ascorbic Acid (VITAMIN C) 1000 MG tablet Take 1,000 mg by mouth daily.     dicyclomine (BENTYL) 20 MG tablet      LATISSE 0.03 % ophthalmic solution      linaclotide (LINZESS) 72 MCG capsule      meloxicam (MOBIC) 15 MG tablet Take 1 tablet (15 mg total) by mouth daily. 90 tablet 1   pantoprazole (PROTONIX) 40 MG tablet Take by mouth.     PARoxetine (PAXIL) 10 MG tablet      tiZANidine (ZANAFLEX) 4 MG tablet TAKE 1 TABLET (4 MG TOTAL) BY MOUTH EVERY 6 (SIX) HOURS AS NEEDED FOR MUSCLE SPASMS. 30 tablet 0   traZODone (DESYREL) 50 MG tablet Take 25 mg by mouth at bedtime.     No  current facility-administered medications on file prior to visit.    BP 130/70   Pulse 72   Temp 98 F (36.7 C)   Resp 16   Ht 5' (1.524 m)   Wt 130 lb 6.4 oz (59.1 kg)   SpO2 92%   BMI 25.47 kg/m       Objective:   Physical Exam  General Mental Status- Alert. General Appearance- Not in acute distress.   Skin General: Color- Normal Color. Moisture- Normal Moisture.  Neck Carotid Arteries- Normal color. Moisture- Normal Moisture. No carotid bruits. No JVD.  Chest and Lung Exam Auscultation: Breath Sounds:-Normal.  Cardiovascular Auscultation:Rythm- Regular. Murmurs & Other Heart Sounds:Auscultation of the heart reveals- No Murmurs.  Abdomen Inspection:-Inspeection Normal. Palpation/Percussion:Note:No mass. Palpation and Percussion of the abdomen reveal- Non Tender, Non Distended + BS, no rebound or guarding.   Neurologic Cranial Nerve exam:- CN III-XII intact(No nystagmus), symmetric smile. Strength:- 5/5 equal and symmetric strength both upper and lower extremities.       Assessment & Plan:  History of signs and symptoms one month ago that may have been covid. Level of fatigue along with loss of smell/taste suspicious. Offered igm and igg covid antibody studies. Studies declined so cancelled.  For severe fatigue cbc, cmp, tsh, t4, b12, b1, iron level and vit d.  Remote hx of rare reflux. Mild symptoms last night maybe recurrent. Making protonix available if were to occur.  With severe fatigue you report concern for possible cardiac cause. Very strong family history with risk factor high cholesterol. We went ahead and did ekg and one set troponin stat. Your ekg was nsr(normal sinus rhythm). Based on your concern and family history placed referral to cardiologist.   Decided to get urine culture as well to see if infection maybe cause for fatigue.  Follow up 7-10 days or as needed  Time spent with patient today was  41 minutes which consisted of chart review,  discussing diagnosis, work up treatment and documentation.

## 2020-12-23 ENCOUNTER — Encounter: Payer: Self-pay | Admitting: Medical

## 2020-12-23 ENCOUNTER — Telehealth: Payer: Self-pay | Admitting: Medical

## 2020-12-23 DIAGNOSIS — R059 Cough, unspecified: Secondary | ICD-10-CM

## 2020-12-23 LAB — COMPREHENSIVE METABOLIC PANEL
ALT: 21 U/L (ref 0–35)
AST: 22 U/L (ref 0–37)
Albumin: 4.3 g/dL (ref 3.5–5.2)
Alkaline Phosphatase: 50 U/L (ref 39–117)
BUN: 22 mg/dL (ref 6–23)
CO2: 30 mEq/L (ref 19–32)
Calcium: 9.4 mg/dL (ref 8.4–10.5)
Chloride: 102 mEq/L (ref 96–112)
Creatinine, Ser: 0.78 mg/dL (ref 0.40–1.20)
GFR: 74.96 mL/min (ref >60.00)
Glucose, Bld: 83 mg/dL (ref 70–99)
Potassium: 4.5 mEq/L (ref 3.5–5.1)
Sodium: 139 mEq/L (ref 135–145)
Total Bilirubin: 0.9 mg/dL (ref 0.2–1.2)
Total Protein: 6.6 g/dL (ref 6.0–8.3)

## 2020-12-23 LAB — CBC WITH DIFFERENTIAL/PLATELET
Basophils Absolute: 0 10*3/uL (ref 0.0–0.1)
Basophils Relative: 0.7 % (ref 0.0–3.0)
Eosinophils Absolute: 0.1 10*3/uL (ref 0.0–0.7)
Eosinophils Relative: 2.2 % (ref 0.0–5.0)
HCT: 37.5 % (ref 36.0–46.0)
Hemoglobin: 12.9 g/dL (ref 12.0–15.0)
Lymphocytes Relative: 26.5 % (ref 12.0–46.0)
Lymphs Abs: 1.4 10*3/uL (ref 0.7–4.0)
MCHC: 34.3 g/dL (ref 30.0–36.0)
MCV: 95.9 fl (ref 78.0–100.0)
Monocytes Absolute: 0.4 10*3/uL (ref 0.1–1.0)
Monocytes Relative: 7.5 % (ref 3.0–12.0)
Neutro Abs: 3.3 10*3/uL (ref 1.4–7.7)
Neutrophils Relative %: 63.1 % (ref 43.0–77.0)
Platelets: 211 10*3/uL (ref 150.0–400.0)
RBC: 3.91 Mil/uL (ref 3.87–5.11)
RDW: 12 % (ref 11.5–15.5)
WBC: 5.2 10*3/uL (ref 4.0–10.5)

## 2020-12-23 LAB — VITAMIN D 25 HYDROXY (VIT D DEFICIENCY, FRACTURES): VITD: 38.74 ng/mL (ref 30.00–100.00)

## 2020-12-23 LAB — TSH: TSH: 2.52 u[IU]/mL (ref 0.35–5.50)

## 2020-12-23 LAB — VITAMIN B12: Vitamin B-12: 698 pg/mL (ref 211–911)

## 2020-12-23 LAB — T4, FREE: Free T4: 0.92 ng/dL (ref 0.60–1.60)

## 2020-12-23 LAB — IRON: Iron: 67 ug/dL (ref 42–145)

## 2020-12-23 NOTE — Telephone Encounter (Signed)
Future chest xray.

## 2020-12-24 ENCOUNTER — Other Ambulatory Visit: Payer: Self-pay

## 2020-12-24 ENCOUNTER — Ambulatory Visit (HOSPITAL_BASED_OUTPATIENT_CLINIC_OR_DEPARTMENT_OTHER)
Admission: RE | Admit: 2020-12-24 | Discharge: 2020-12-24 | Disposition: A | Discharge status: Home / Self Care | Payer: Medicare HMO | Source: Ambulatory Visit | Attending: Medical | Admitting: Medical

## 2020-12-24 ENCOUNTER — Encounter: Payer: Self-pay | Admitting: Medical

## 2020-12-24 DIAGNOSIS — R059 Cough, unspecified: Secondary | ICD-10-CM | POA: Insufficient documentation

## 2020-12-29 LAB — URINE CULTURE
MICRO NUMBER:: 12109057
SPECIMEN QUALITY:: ADEQUATE

## 2020-12-29 LAB — VITAMIN B1: Vitamin B1 (Thiamine): 28 nmol/L (ref 8–30)

## 2020-12-29 LAB — VITAMIN D 1,25 DIHYDROXY

## 2020-12-29 LAB — TROPONIN I: Troponin I: 11 ng/L (ref <=47)

## 2021-01-01 ENCOUNTER — Telehealth: Payer: Self-pay

## 2021-01-01 NOTE — Telephone Encounter (Signed)
Received a fax stating that the pts Vitamin D was not performed ( No Serum Received) - labs collected 12/22/2020.   But it looks like a Vitamin D was resulted 12/23/20.    Lab result annotation:  Mackie Pai, PA-C  12/30/2020  8:54 AM EDT Back to Top     Pt vit D level was not done? Can get done later with Dr. Etter Sjogren if she agreeswould be beneficial.

## 2021-01-04 DIAGNOSIS — Z79899 Other long term (current) drug therapy: Secondary | ICD-10-CM | POA: Diagnosis not present

## 2021-01-04 DIAGNOSIS — R109 Unspecified abdominal pain: Secondary | ICD-10-CM | POA: Diagnosis not present

## 2021-01-04 DIAGNOSIS — R1084 Generalized abdominal pain: Secondary | ICD-10-CM | POA: Diagnosis not present

## 2021-01-04 DIAGNOSIS — R0789 Other chest pain: Secondary | ICD-10-CM | POA: Diagnosis not present

## 2021-01-04 DIAGNOSIS — R9431 Abnormal electrocardiogram [ECG] [EKG]: Secondary | ICD-10-CM | POA: Diagnosis not present

## 2021-01-04 DIAGNOSIS — R079 Chest pain, unspecified: Secondary | ICD-10-CM | POA: Diagnosis not present

## 2021-01-05 ENCOUNTER — Other Ambulatory Visit: Payer: Self-pay

## 2021-01-05 ENCOUNTER — Telehealth (INDEPENDENT_AMBULATORY_CARE_PROVIDER_SITE_OTHER): Payer: Medicare HMO | Admitting: Family Medicine

## 2021-01-05 DIAGNOSIS — R079 Chest pain, unspecified: Secondary | ICD-10-CM

## 2021-01-05 DIAGNOSIS — R1011 Right upper quadrant pain: Secondary | ICD-10-CM | POA: Diagnosis not present

## 2021-01-05 NOTE — Progress Notes (Signed)
MyChart Video Visit    Virtual Visit via Video Note   This visit type was conducted due to national recommendations for restrictions regarding the COVID-19 Pandemic (e.g. social distancing) in an effort to limit this patient's exposure and mitigate transmission in our community. This patient is at least at moderate risk for complications without adequate follow up. This format is felt to be most appropriate for this patient at this time. Physical exam was limited by quality of the video and audio technology used for the visit. he was able to get the patient set up on a video visit.  Patient location: Home Patient and provider in visit Provider location: Office  I discussed the limitations of evaluation and management by telemedicine and the availability of in person appointments. The patient expressed understanding and agreed to proceed.  Visit Date: 01/05/2021  Today's healthcare provider: Ann Held, DO     Subjective:    Patient ID: Stephanie Lara, female    DOB: 06/07/1946, 75 y.o.   MRN: [id number]  Chief Complaint  Patient presents with   URI    Fatigue, headache, stomach issues, bloating, constipation x 12/08/20. no covid test    HPI Patient is in today for a video visit.   Cough and congestion in June and lost taste and smell but covid neg--- she tested early.   They got back and now she has headaches, fatigue and bloating and chest pain -- she went to er and w/u was neg.  She was told to see cardiologist.  Pt also c/o gerd problems she is taking her pantoprazole.    Past Medical History:  Diagnosis Date   Chicken pox    Contact dermatitis due to plants, except food 10/20/2008   Overview:  Dermatitis Due To Contact With Plants   Dizzy 06/21/2018   Fatigue 11/11/2016   Globus pharyngeus 04/18/2013   Hot flashes 04/26/2019   Hyperlipidemia    Insomnia 03/03/2015   Lipoma of neck 11/02/2011   Other headache syndrome 06/21/2018   Preventative health care 01/09/2017    Primary insomnia 12/22/2015   Rheumatoid arthritis ([place])    Upper respiratory tract infection 05/24/2018   Urinary incontinence 03/03/2015    Past Surgical History:  Procedure Laterality Date   ABDOMINOPLASTY     BREAST BIOPSY     LIPOMA EXCISION     neck   PLACEMENT OF BREAST IMPLANTS     RHINOPLASTY     SEPTOPLASTY      Family History  Problem Relation Age of Onset   Hypertension Mother    Heart disease Father    Sudden death Father    Heart attack Father    Gallbladder disease Father    Deep vein thrombosis Father    Diabetes Paternal Grandmother    Hypertension Brother     Social History   Socioeconomic History   Marital status: Married    Spouse name: Not on file   Number of children: Not on file   Years of education: Not on file   Highest education level: Not on file  Occupational History   Occupation: retired Teacher, 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[employer]  Tobacco Use   Smoking status: Never   Smokeless tobacco: Never  Vaping Use   Vaping Use: Never used  Substance and Sexual Activity   Alcohol use: Yes    Alcohol/week: 0.0 standard drinks    Comment: Occ   Drug use: No   Sexual activity: Not on file  Other Topics Concern  Not on file  Social History Narrative   Not on file   Social Determinants of Health   Financial Resource Strain: Not on file  Food Insecurity: Not on file  Transportation Needs: Not on file  Physical Activity: Not on file  Stress: Not on file  Social Connections: Not on file  Intimate Partner Violence: Not on file    Outpatient Medications Prior to Visit  Medication Sig Dispense Refill   Ascorbic Acid (VITAMIN C) 1000 MG tablet Take 1,000 mg by mouth daily.     LATISSE 0.03 % ophthalmic solution Place 1 drop into both eyes daily.     pantoprazole (PROTONIX) 40 MG tablet Take 1 tablet (40 mg total) by mouth daily. 30 tablet 0   traZODone (DESYREL) 50 MG tablet Take 25 mg by mouth at bedtime.     dicyclomine (BENTYL) 20 MG tablet       linaclotide (LINZESS) 72 MCG capsule      meloxicam (MOBIC) 15 MG tablet Take 1 tablet (15 mg total) by mouth daily. 90 tablet 1   PARoxetine (PAXIL) 10 MG tablet      tiZANidine (ZANAFLEX) 4 MG tablet TAKE 1 TABLET (4 MG TOTAL) BY MOUTH EVERY 6 (SIX) HOURS AS NEEDED FOR MUSCLE SPASMS. 30 tablet 0   No facility-administered medications prior to visit.    No Known Allergies  Review of Systems  Constitutional:  Negative for fever and malaise/fatigue.  HENT:  Negative for congestion.   Eyes:  Negative for blurred vision.  Respiratory:  Negative for shortness of breath.   Cardiovascular:  Positive for chest pain. Negative for palpitations and leg swelling.  Gastrointestinal:  Positive for abdominal pain and nausea. Negative for blood in stool.  Genitourinary:  Negative for dysuria and frequency.  Musculoskeletal:  Negative for falls.  Skin:  Negative for rash.  Neurological:  Negative for dizziness, loss of consciousness and headaches.  Endo/Heme/Allergies:  Negative for environmental allergies.  Psychiatric/Behavioral:  Negative for depression. The patient is not nervous/anxious.       Objective:    Physical Exam Vitals and nursing note reviewed.  Constitutional:      Appearance: Normal appearance.  Abdominal:     Tenderness: There is abdominal tenderness in the epigastric area. There is no guarding or rebound.  Neurological:     Mental Status: She is alert.    There were no vitals taken for this visit. Wt Readings from Last 3 Encounters:  12/22/20 130 lb 6.4 oz (59.1 kg)  04/06/20 132 lb 6.4 oz (60.1 kg)  12/10/19 129 lb 6.4 oz (58.7 kg)    Diabetic Foot Exam - Simple   No data filed    Lab Results  Component Value Date   WBC 5.2 12/22/2020   HGB 12.9 12/22/2020   HCT 37.5 12/22/2020   PLT 211.0 12/22/2020   GLUCOSE 83 12/22/2020   CHOL 277 (H) 04/06/2020   TRIG 80 04/06/2020   HDL 68 04/06/2020   LDLCALC 190 (H) 04/06/2020   ALT 21 12/22/2020   AST 22  12/22/2020   NA 139 12/22/2020   K 4.5 12/22/2020   CL 102 12/22/2020   CREATININE 0.78 12/22/2020   BUN 22 12/22/2020   CO2 30 12/22/2020   TSH 2.52 12/22/2020    Lab Results  Component Value Date   TSH 2.52 12/22/2020   Lab Results  Component Value Date   WBC 5.2 12/22/2020   HGB 12.9 12/22/2020   HCT 37.5 12/22/2020   MCV  95.9 12/22/2020   PLT 211.0 12/22/2020   Lab Results  Component Value Date   NA 139 12/22/2020   K 4.5 12/22/2020   CO2 30 12/22/2020   GLUCOSE 83 12/22/2020   BUN 22 12/22/2020   CREATININE 0.78 12/22/2020   BILITOT 0.9 12/22/2020   ALKPHOS 50 12/22/2020   AST 22 12/22/2020   ALT 21 12/22/2020   PROT 6.6 12/22/2020   ALBUMIN 4.3 12/22/2020   CALCIUM 9.4 12/22/2020   GFR 74.96 12/22/2020   Lab Results  Component Value Date   CHOL 277 (H) 04/06/2020   Lab Results  Component Value Date   HDL 68 04/06/2020   Lab Results  Component Value Date   LDLCALC 190 (H) 04/06/2020   Lab Results  Component Value Date   TRIG 80 04/06/2020   Lab Results  Component Value Date   CHOLHDL 4.1 04/06/2020   No results found for: HGBA1C     Assessment & Plan:   Problem List Items Addressed This Visit       Unprioritized   Chest pain - Primary    con't protonix--- can take bid  May need gi f/u Pt states she had hida scan but was told they did not have everything they needed it to do it correctly  Check [place] and consider repeat hida scan        Relevant Orders   Ambulatory referral to Cardiology   RUQ pain    [place] abd and consider hida scan        Relevant Orders   US Abdomen Limited RUQ (LIVER/GB) (Completed)     No orders of the defined types were placed in this encounter.   I discussed the assessment and treatment plan with the patient. The patient was provided an opportunity to ask questions and all were answered. The patient agreed with the plan and demonstrated an understanding of the instructions.   The patient was advised to  call back or seek an in-person evaluation if the symptoms worsen or if the condition fails to improve as anticipated.  I provided 20 minutes of face-to-face time during this encounter.   Ann Held, DO [place] [phone] (phone) [phone] (fax)  [place]

## 2021-01-06 ENCOUNTER — Other Ambulatory Visit: Payer: Self-pay

## 2021-01-06 ENCOUNTER — Other Ambulatory Visit: Payer: Self-pay | Admitting: Family Medicine

## 2021-01-06 ENCOUNTER — Ambulatory Visit (HOSPITAL_BASED_OUTPATIENT_CLINIC_OR_DEPARTMENT_OTHER)
Admission: RE | Admit: 2021-01-06 | Discharge: 2021-01-06 | Disposition: A | Discharge status: Home / Self Care | Payer: Medicare HMO | Source: Ambulatory Visit | Attending: Family Medicine | Admitting: Family Medicine

## 2021-01-06 DIAGNOSIS — R079 Chest pain, unspecified: Secondary | ICD-10-CM | POA: Insufficient documentation

## 2021-01-06 DIAGNOSIS — R1011 Right upper quadrant pain: Secondary | ICD-10-CM | POA: Insufficient documentation

## 2021-01-06 DIAGNOSIS — E785 Hyperlipidemia, unspecified: Secondary | ICD-10-CM | POA: Insufficient documentation

## 2021-01-06 DIAGNOSIS — B019 Varicella without complication: Secondary | ICD-10-CM | POA: Insufficient documentation

## 2021-01-06 HISTORY — DX: Right upper quadrant pain: R10.11

## 2021-01-06 HISTORY — DX: Chest pain, unspecified: R07.9

## 2021-01-06 NOTE — Assessment & Plan Note (Signed)
[place] abd and consider hida scan

## 2021-01-06 NOTE — Assessment & Plan Note (Signed)
con't protonix--- can take bid  May need gi f/u Pt states she had hida scan but was told they did not have everything they needed it to do it correctly  Check [place] and consider repeat hida scan

## 2021-01-07 ENCOUNTER — Other Ambulatory Visit: Payer: Self-pay | Admitting: Oncology

## 2021-01-07 ENCOUNTER — Encounter: Payer: Self-pay | Admitting: Family Medicine

## 2021-01-08 ENCOUNTER — Encounter: Payer: Self-pay | Admitting: Cardiology

## 2021-01-08 ENCOUNTER — Ambulatory Visit: Payer: Medicare HMO | Admitting: Cardiology

## 2021-01-08 ENCOUNTER — Other Ambulatory Visit: Payer: Self-pay

## 2021-01-08 VITALS — BP 136/74 | HR 73 | Ht 60.0 in | Wt 126.1 lb

## 2021-01-08 DIAGNOSIS — R0600 Dyspnea, unspecified: Secondary | ICD-10-CM

## 2021-01-08 DIAGNOSIS — E785 Hyperlipidemia, unspecified: Secondary | ICD-10-CM | POA: Diagnosis not present

## 2021-01-08 DIAGNOSIS — R0609 Other forms of dyspnea: Secondary | ICD-10-CM

## 2021-01-08 NOTE — Addendum Note (Signed)
Addended by: Jyl Heinz R on: 01/08/2021 02:15 PM   Modules accepted: Orders

## 2021-01-08 NOTE — Progress Notes (Signed)
Cardiology Office Note:    Date:  01/08/2021   ID:  Stephanie Lara, DOB [birth year], MRN [id number]  PCP:  Carollee Herter, Alferd Apa, DO  Cardiologist:  Jenean Lindau, MD   Referring MD: Mackie Pai, PA-C    ASSESSMENT:    1. Hyperlipidemia, unspecified hyperlipidemia type   2. DOE (dyspnea on exertion)    PLAN:    In order of problems listed above:  Primary prevention stressed with the patient.  Importance of compliance with diet medication stressed and she vocalized understanding. Dyspnea on exertion: I discussed my findings with the patient.  To reassure her we will do exercise stress echo when she is agreeable.  Overall she is an active lady.  But she wants to step up on her exercise. Mixed dyslipidemia: Lipids are markedly elevated.  I discussed this with her and for restratification we will repeat them since they have not been done for the past several months.  I also discussed coronary calcium screening so that we can be more focused.  Pursuing her lipids and she is agreeable.  She will be back in the next few days for complete blood work including lipids Patient had multiple questions which were answered to her satisfaction Patient will be seen in follow-up appointment in 6 months or earlier if the patient has any concerns    Medication Adjustments/Labs and Tests Ordered: Current medicines are reviewed at length with the patient today.  Concerns regarding medicines are outlined above.  Orders Placed This Encounter  Procedures   CT CARDIAC SCORING   Basic metabolic panel   Hepatic function panel   Lipid panel   EKG 12-Lead   ECHOCARDIOGRAM STRESS TEST   No orders of the defined types were placed in this encounter.    History of Present Illness:    Stephanie Lara is a 75 y.o. female who is being seen today for the evaluation of dyspnea on exertion at the request of [place], Percell Miller, [place].  Patient is a pleasant 75 year old female.  She has past medical history of  mixed dyslipidemia.  She mentions to me that she has some dyspnea on exertion that has concerned her.  No orthopnea or PND.  Overall she is an active lady.  She is here to be evaluated for the same reason.  At the time of my evaluation, the patient is alert awake oriented and in no distress.  She tells me that she wants to push harder with her exercise and her shortness of breath does concern her son.  Past Medical History:  Diagnosis Date   Chicken pox    Contact dermatitis due to plants, except food 10/20/2008   Overview:  Dermatitis Due To Contact With Plants   Dizzy 06/21/2018   Fatigue 11/11/2016   Globus pharyngeus 04/18/2013   Hot flashes 04/26/2019   Hyperlipidemia    Insomnia 03/03/2015   Lipoma of neck 11/02/2011   Other headache syndrome 06/21/2018   Preventative health care 01/09/2017   Primary insomnia 12/22/2015   Rheumatoid arthritis ([place])    Upper respiratory tract infection 05/24/2018   Urinary incontinence 03/03/2015    Past Surgical History:  Procedure Laterality Date   ABDOMINOPLASTY     BREAST BIOPSY     LIPOMA EXCISION     neck   PLACEMENT OF BREAST IMPLANTS     RHINOPLASTY     SEPTOPLASTY      Current Medications: Current Meds  Medication Sig   Ascorbic Acid (VITAMIN C) 1000 MG tablet Take  1,000 mg by mouth daily.   Famotidine (PEPCID AC PO) Take 1 tablet by mouth every evening.   LATISSE 0.03 % ophthalmic solution Place 1 drop into both eyes daily.   pantoprazole (PROTONIX) 40 MG tablet Take 1 tablet (40 mg total) by mouth daily.   traZODone (DESYREL) 50 MG tablet Take 50 mg by mouth at bedtime.     Allergies:   Patient has no known allergies.   Social History   Socioeconomic History   Marital status: Married    Spouse name: Not on file   Number of children: Not on file   Years of education: Not on file   Highest education level: Not on file  Occupational History   Occupation: retired Teacher, 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[employer]  Tobacco Use   Smoking status: Never    Smokeless tobacco: Never  Vaping Use   Vaping Use: Never used  Substance and Sexual Activity   Alcohol use: Yes    Alcohol/week: 0.0 standard drinks    Comment: Occ   Drug use: No   Sexual activity: Not on file  Other Topics Concern   Not on file  Social History Narrative   Not on file   Social Determinants of Health   Financial Resource Strain: Not on file  Food Insecurity: Not on file  Transportation Needs: Not on file  Physical Activity: Not on file  Stress: Not on file  Social Connections: Not on file     Family History: The patient's family history includes Deep vein thrombosis in her father; Diabetes in her paternal grandmother; Gallbladder disease in her father; Heart attack in her father; Heart disease in her father; Hypertension in her brother and mother; Sudden death in her father.  ROS:   Please see the history of present illness.    All other systems reviewed and are negative.  EKGs/Labs/Other Studies Reviewed:    The following studies were reviewed today: EKG was sinus rhythm and nonspecific ST-T changes.   Recent Labs: 12/22/2020: ALT 21; BUN 22; Creatinine, Ser 0.78; Hemoglobin 12.9; Platelets 211.0; Potassium 4.5; Sodium 139; TSH 2.52  Recent Lipid Panel    Component Value Date/Time   CHOL 277 (H) 04/06/2020 1350   TRIG 80 04/06/2020 1350   HDL 68 04/06/2020 1350   CHOLHDL 4.1 04/06/2020 1350   VLDL 10.6 04/04/2019 1339   LDLCALC 190 (H) 04/06/2020 1350    Physical Exam:    VS:  BP 136/74   Pulse 73   Ht 5' (1.524 m)   Wt 126 lb 1.9 oz (57.2 kg)   SpO2 97%   BMI 24.63 kg/m     Wt Readings from Last 3 Encounters:  01/08/21 126 lb 1.9 oz (57.2 kg)  12/22/20 130 lb 6.4 oz (59.1 kg)  04/06/20 132 lb 6.4 oz (60.1 kg)     GEN: Patient is in no acute distress HEENT: Normal NECK: No JVD; No carotid bruits LYMPHATICS: No lymphadenopathy CARDIAC: S1 S2 regular, 2/6 systolic murmur at the apex. RESPIRATORY:  Clear to auscultation without  rales, wheezing or rhonchi  ABDOMEN: Soft, non-tender, non-distended MUSCULOSKELETAL:  No edema; No deformity  SKIN: Warm and dry NEUROLOGIC:  Alert and oriented x 3 PSYCHIATRIC:  Normal affect    Signed, Jenean Lindau, MD  01/08/2021 12:16 PM    [facility]

## 2021-01-08 NOTE — Addendum Note (Signed)
Addended by: Truddie Hidden on: 01/08/2021 02:14 PM   Modules accepted: Orders

## 2021-01-08 NOTE — Patient Instructions (Signed)
Medication Instructions:  No medication changes. *If you need a refill on your cardiac medications before your next appointment, please call your pharmacy*   Lab Work: Your physician recommends that you return for lab work in: 6 weeks You need to have labs done when you are fasting.  You can come Monday through Friday 8:30 am to 12:00 pm and 1:15 to 4:30. You do not need to make an appointment as the order has already been placed. The labs you are going to have done are BMET, CBC, TSH, LFT and Lipids.  If you have labs (blood work) drawn today and your tests are completely normal, you will receive your results only by: [place] (if you have MyChart) OR A paper copy in the mail If you have any lab test that is abnormal or we need to change your treatment, we will call you to review the results.   Testing/Procedures:  We will order CT coronary calcium score. It will cost $99.00 and is not covered by insurance.  Please call [phone] to schedule.   [facility]  [id number], [place] [address]      Stress Echocardiogram Information Sheet                                                      Instructions:    1. You may take your morning medications the morning of the test  2. Light breakfast no caffeine  3. Dress prepared to exercise.  4. DO NOT use ANY caffeine or tobacco products 3 hours before appointment.  5. Please bring all current prescription medications.    Follow-Up: At [facility], you and your health needs are our priority.  As part of our continuing mission to provide you with exceptional heart care, we have created designated Provider Care Teams.  These Care Teams include your primary Cardiologist (physician) and Advanced Practice Providers (APPs -  Physician Assistants and Nurse Practitioners) who all work together to provide you with the care you need, when you need it.  We recommend signing up for the patient portal called  "MyChart".  Sign up information is provided on this After Visit Summary.  MyChart is used to connect with patients for Virtual Visits (Telemedicine).  Patients are able to view lab/test results, encounter notes, upcoming appointments, etc.  Non-urgent messages can be sent to your provider as well.   To learn more about what you can do with MyChart, go to [email or web address].    Your next appointment:   2 month(s)  The format for your next appointment:   In Person  Provider:   Jyl Heinz, MD   Other Instructions  Coronary Calcium Scan A coronary calcium scan is an imaging test used to look for deposits of plaque in the inner lining of the blood vessels of the heart (coronary arteries). Plaque is made up of calcium, protein, and fatty substances. These deposits of plaque can partly clog and narrow the coronary arteries without producing any symptoms or warning signs. This puts a person at risk for a heart attack. This test is recommended for people who are at moderate risk for heart disease. The test can find plaque deposits before symptoms develop. Tell a health care provider about: Any allergies you have. All medicines you are taking, including vitamins, herbs,  eye drops, creams, and over-the-counter medicines. Any problems you or family members have had with anesthetic medicines. Any blood disorders you have. Any surgeries you have had. Any medical conditions you have. Whether you are pregnant or may be pregnant. What are the risks? Generally, this is a safe procedure. However, problems may occur, including: Harm to a pregnant woman and her unborn baby. This test involves the use of radiation. Radiation exposure can be dangerous to a pregnant woman and her unborn baby. If you are pregnant or think you may be pregnant, you should not have this procedure done. Slight increase in the risk of cancer. This is because of the radiation involved in the test. What happens before the  procedure? Ask your health care provider for any specific instructions on how to prepare for this procedure. You may be asked to avoid products that contain caffeine, tobacco, or nicotine for 4 hours before the procedure. What happens during the procedure? You will undress and remove any jewelry from your neck or chest. You will put on a hospital gown. Sticky electrodes will be placed on your chest. The electrodes will be connected to an electrocardiogram (ECG) machine to record a tracing of the electrical activity of your heart. You will lie down on a curved bed that is attached to the [place]. You may be given medicine to slow down your heart rate so that clear pictures can be created. You will be moved into the CT scanner, and the CT scanner will take pictures of your heart. During this time, you will be asked to lie still and hold your breath for 2-3 seconds at a time while each picture of your heart is being taken. The procedure may vary among health care providers and hospitals.    What happens after the procedure? You can get dressed. You can return to your normal activities. It is up to you to get the results of your procedure. Ask your health care provider, or the department that is doing the procedure, when your results will be ready. Summary A coronary calcium scan is an imaging test used to look for deposits of plaque in the inner lining of the blood vessels of the heart (coronary arteries). Plaque is made up of calcium, protein, and fatty substances. Generally, this is a safe procedure. Tell your health care provider if you are pregnant or may be pregnant. Ask your health care provider for any specific instructions on how to prepare for this procedure. A CT scanner will take pictures of your heart. You can return to your normal activities after the scan is done. This information is not intended to replace advice given to you by your health care provider. Make sure you discuss any  questions you have with your health care provider. Document Revised: 12/18/2018 Document Reviewed: 12/18/2018 Elsevier Patient Education  [place].

## 2021-01-11 ENCOUNTER — Ambulatory Visit (HOSPITAL_BASED_OUTPATIENT_CLINIC_OR_DEPARTMENT_OTHER)
Admission: RE | Admit: 2021-01-11 | Discharge: 2021-01-11 | Disposition: A | Discharge status: Home / Self Care | Payer: Self-pay | Source: Ambulatory Visit | Attending: Cardiology | Admitting: Cardiology

## 2021-01-11 ENCOUNTER — Other Ambulatory Visit: Payer: Self-pay

## 2021-01-11 DIAGNOSIS — E785 Hyperlipidemia, unspecified: Secondary | ICD-10-CM | POA: Diagnosis not present

## 2021-01-11 DIAGNOSIS — I700 Atherosclerosis of aorta: Secondary | ICD-10-CM | POA: Insufficient documentation

## 2021-01-12 ENCOUNTER — Other Ambulatory Visit: Payer: Self-pay

## 2021-01-12 DIAGNOSIS — E785 Hyperlipidemia, unspecified: Secondary | ICD-10-CM

## 2021-01-12 LAB — LIPID PANEL
Chol/HDL Ratio: 3.6 ratio (ref 0.0–4.4)
Cholesterol, Total: 246 mg/dL — ABNORMAL HIGH (ref 100–199)
HDL: 69 mg/dL (ref >39)
LDL Chol Calc (NIH): 169 mg/dL — ABNORMAL HIGH (ref 0–99)
Triglycerides: 52 mg/dL (ref 0–149)
VLDL Cholesterol Cal: 8 mg/dL (ref 5–40)

## 2021-01-12 LAB — BASIC METABOLIC PANEL
BUN/Creatinine Ratio: 26 (ref 12–28)
BUN: 21 mg/dL (ref 8–27)
CO2: 23 mmol/L (ref 20–29)
Calcium: 9.5 mg/dL (ref 8.7–10.3)
Chloride: 104 mmol/L (ref 96–106)
Creatinine, Ser: 0.82 mg/dL (ref 0.57–1.00)
Glucose: 88 mg/dL (ref 65–99)
Potassium: 4.6 mmol/L (ref 3.5–5.2)
Sodium: 142 mmol/L (ref 134–144)
eGFR: 75 mL/min/{1.73_m2} (ref >59)

## 2021-01-12 LAB — HEPATIC FUNCTION PANEL
ALT: 16 IU/L (ref 0–32)
AST: 19 IU/L (ref 0–40)
Albumin: 4.3 g/dL (ref 3.7–4.7)
Alkaline Phosphatase: 57 IU/L (ref 44–121)
Bilirubin Total: 0.8 mg/dL (ref 0.0–1.2)
Bilirubin, Direct: 0.19 mg/dL (ref 0.00–0.40)
Total Protein: 6.6 g/dL (ref 6.0–8.5)

## 2021-01-12 MED ORDER — ATORVASTATIN CALCIUM 10 MG PO TABS
10.0000 mg | ORAL_TABLET | Freq: Every day | ORAL | 3 refills | Status: DC
Start: 2021-01-12 — End: 2021-01-22

## 2021-01-12 MED ORDER — ATORVASTATIN CALCIUM 10 MG PO TABS: 10.0000 mg | ORAL_TABLET | Freq: Every day | ORAL | 3 refills | Status: DC

## 2021-01-21 DIAGNOSIS — E785 Hyperlipidemia, unspecified: Secondary | ICD-10-CM

## 2021-01-22 ENCOUNTER — Ambulatory Visit: Payer: Medicare HMO | Admitting: Cardiology

## 2021-01-22 MED ORDER — PITAVASTATIN CALCIUM 1 MG PO TABS: 1.0000 mg | ORAL_TABLET | Freq: Every day | ORAL | 3 refills | Status: DC

## 2021-01-26 DIAGNOSIS — K5909 Other constipation: Secondary | ICD-10-CM | POA: Diagnosis not present

## 2021-01-26 DIAGNOSIS — K219 Gastro-esophageal reflux disease without esophagitis: Secondary | ICD-10-CM | POA: Diagnosis not present

## 2021-01-26 DIAGNOSIS — R1011 Right upper quadrant pain: Secondary | ICD-10-CM | POA: Diagnosis not present

## 2021-01-28 ENCOUNTER — Telehealth: Payer: Self-pay | Admitting: Cardiology

## 2021-01-28 ENCOUNTER — Telehealth: Payer: Self-pay

## 2021-01-28 NOTE — Telephone Encounter (Signed)
Follow Up:     Patient is calling back, she is is returning Javanna's call from today.

## 2021-01-28 NOTE — Telephone Encounter (Signed)
Patient states Livalo is 1000$ and is requesting med change. She is willing to try Pravastatin. Message sent to Dr. Geraldo Pitter.

## 2021-01-28 NOTE — Telephone Encounter (Signed)
[employer] sent a message stating the patient is requesting Livalo to be changed. It look like the alternatives she has tried. I called and left a message to return my call.

## 2021-01-29 MED ORDER — EZETIMIBE 10 MG PO TABS: 10.0000 mg | ORAL_TABLET | Freq: Every day | ORAL | 0 refills | Status: DC

## 2021-01-29 MED ORDER — PRAVASTATIN SODIUM 10 MG PO TABS: 10.0000 mg | ORAL_TABLET | Freq: Every day | ORAL | 0 refills | Status: DC

## 2021-01-29 NOTE — Telephone Encounter (Addendum)
Per Dr. Geraldo Pitter: Pravastatin 10 mg daily and liver lipid check in 6 weeks.  Also Zetia 10 mg daily.  Copy primary care    Patient notified and agree with plan. Both meds sent to [facility]. Patient is aware in a month she can request her 90 days supply to [facility]. Lab orders in place patient aware to come by in 6 weeks for blood work.   CC to PCP

## 2021-01-29 NOTE — Addendum Note (Signed)
Addended by: Darrel Reach on: 01/29/2021 09:09 AM   Modules accepted: Orders

## 2021-02-04 ENCOUNTER — Telehealth: Payer: Self-pay | Admitting: Cardiology

## 2021-02-04 DIAGNOSIS — E785 Hyperlipidemia, unspecified: Secondary | ICD-10-CM

## 2021-02-04 NOTE — Telephone Encounter (Signed)
Pt c/o medication issue:  1. Name of Medication: Pitavastatin Calcium 1 MG TABS  2. How are you currently taking this medication (dosage and times per day)? 1 tablet by mouth  3. Are you having a reaction (difficulty breathing--STAT)? no  4. What is your medication issue? Medication is not on the formulary list so it will not be cover. Calling to see if there is another medication patient can use.  Patient ref [id number]

## 2021-02-05 NOTE — Telephone Encounter (Signed)
Follow Up:       Patient is returning Tiffany's call from today. 

## 2021-02-05 NOTE — Telephone Encounter (Signed)
Left voicemail for patient to return call about her Pitavastatin.  Patient was switched to Pravastatin 10 mg and Ezetimibe 10 mg daily according to note on 01/28/21.  Seeking clarification on which medication she is going to be taking and offer to submit a PA for the Pitavastatin if need be.

## 2021-02-05 NOTE — Telephone Encounter (Signed)
Pt states that she is having aching from the pitvastatin. Can we refer her to the lipid clinic for statin intolerance?

## 2021-02-08 NOTE — Addendum Note (Signed)
Addended by: Truddie Hidden on: 02/08/2021 04:01 PM   Modules accepted: Orders

## 2021-02-11 DIAGNOSIS — K828 Other specified diseases of gallbladder: Secondary | ICD-10-CM | POA: Diagnosis not present

## 2021-02-11 DIAGNOSIS — R1011 Right upper quadrant pain: Secondary | ICD-10-CM | POA: Diagnosis not present

## 2021-02-23 ENCOUNTER — Telehealth (HOSPITAL_COMMUNITY): Payer: Self-pay

## 2021-02-23 NOTE — Telephone Encounter (Signed)
Spoke with the patient, detailed instructions given. She stated that she would be here for her test. Asked to call back with any questions. Stephanie Lara EMTP 

## 2021-02-25 ENCOUNTER — Encounter: Payer: Self-pay | Admitting: Internal Medicine

## 2021-02-25 ENCOUNTER — Other Ambulatory Visit: Payer: Self-pay

## 2021-02-25 ENCOUNTER — Other Ambulatory Visit (HOSPITAL_COMMUNITY): Payer: Self-pay | Admitting: Cardiology

## 2021-02-25 ENCOUNTER — Ambulatory Visit (HOSPITAL_COMMUNITY): Payer: Medicare HMO

## 2021-02-25 ENCOUNTER — Ambulatory Visit: Payer: Medicare HMO | Admitting: Cardiology

## 2021-02-25 ENCOUNTER — Ambulatory Visit (HOSPITAL_COMMUNITY): Payer: Medicare HMO | Attending: Cardiovascular Disease

## 2021-02-25 DIAGNOSIS — R0609 Other forms of dyspnea: Secondary | ICD-10-CM

## 2021-02-25 DIAGNOSIS — R1011 Right upper quadrant pain: Secondary | ICD-10-CM | POA: Diagnosis not present

## 2021-02-25 DIAGNOSIS — R948 Abnormal results of function studies of other organs and systems: Secondary | ICD-10-CM | POA: Insufficient documentation

## 2021-02-25 DIAGNOSIS — R0600 Dyspnea, unspecified: Secondary | ICD-10-CM | POA: Diagnosis not present

## 2021-02-25 DIAGNOSIS — M069 Rheumatoid arthritis, unspecified: Secondary | ICD-10-CM | POA: Diagnosis not present

## 2021-02-25 HISTORY — DX: Abnormal results of function studies of other organs and systems: R94.8

## 2021-02-25 LAB — ECHOCARDIOGRAM LIMITED
Area-P 1/2: 4.49 cm2
MV M vel: 6.61 m/s
MV Peak grad: 174.8 mmHg
Radius: 0.5 cm
S' Lateral: 2.4 cm

## 2021-02-25 MED ORDER — PERFLUTREN LIPID MICROSPHERE
1.0000 mL | INTRAVENOUS | Status: AC | PRN
  Administered 2021-02-25: 2 mL via INTRAVENOUS

## 2021-02-26 ENCOUNTER — Other Ambulatory Visit: Payer: Self-pay

## 2021-02-26 ENCOUNTER — Telehealth: Payer: Self-pay

## 2021-02-26 DIAGNOSIS — R0600 Dyspnea, unspecified: Secondary | ICD-10-CM

## 2021-02-26 DIAGNOSIS — R0609 Other forms of dyspnea: Secondary | ICD-10-CM

## 2021-02-26 DIAGNOSIS — E785 Hyperlipidemia, unspecified: Secondary | ICD-10-CM

## 2021-02-26 NOTE — Telephone Encounter (Signed)
Stress echo has been ordered.

## 2021-02-26 NOTE — Telephone Encounter (Signed)
Left message on patients voicemail to please return our call.   

## 2021-02-26 NOTE — Telephone Encounter (Signed)
-----   Message from Werner Lean, MD sent at 02/25/2021  6:10 PM EDT ----- Regarding: RE: Definity allergy Sure, happy to help.  Triage team:  could we get this patient back for stress echo (without contrast) she already had the attestation completed.  Thanks, MAC   ----- Message ----- From: Jenean Lindau, MD Sent: 02/25/2021   3:28 PM EDT To: Werner Lean, MD Subject: RE: Definity allergy                           In that case I would appreciate her getting a stress echo without contrast.  Thank you ...Marland KitchenMarland KitchenMarland Kitchenwould you be kind enough to set it up for her? ----- Message ----- From: Werner Lean, MD Sent: 02/25/2021   2:51 PM EDT To: Jenean Lindau, MD Subject: Definity allergy                               Sherre Poot,  Wanted to close the loop: Ms. E had a severe reaction to Definity with 10/10 back pain.  We had to cancel the stress test.  She has good imaging planes and we could do a non-contrast stress test for ischemia if needed.   Thanks, MAC

## 2021-02-26 NOTE — Telephone Encounter (Signed)
-----   Message from Jenean Lindau, MD sent at 02/25/2021  4:54 PM EDT ----- Mild to moderate mitral regurgitation.  Otherwise the results of the study is unremarkable. Please inform patient. I will discuss in detail at next appointment. Cc  primary care/referring physician Jenean Lindau, MD 02/25/2021 4:53 PM

## 2021-02-27 LAB — HEPATIC FUNCTION PANEL
ALT: 28 IU/L (ref 0–32)
AST: 24 IU/L (ref 0–40)
Albumin: 4.3 g/dL (ref 3.7–4.7)
Alkaline Phosphatase: 56 IU/L (ref 44–121)
Bilirubin Total: 1.1 mg/dL (ref 0.0–1.2)
Bilirubin, Direct: 0.27 mg/dL (ref 0.00–0.40)
Total Protein: 6.3 g/dL (ref 6.0–8.5)

## 2021-02-27 LAB — LIPID PANEL
Chol/HDL Ratio: 2.8 ratio (ref 0.0–4.4)
Cholesterol, Total: 210 mg/dL — ABNORMAL HIGH (ref 100–199)
HDL: 75 mg/dL (ref >39)
LDL Chol Calc (NIH): 125 mg/dL — ABNORMAL HIGH (ref 0–99)
Triglycerides: 56 mg/dL (ref 0–149)
VLDL Cholesterol Cal: 10 mg/dL (ref 5–40)

## 2021-03-01 ENCOUNTER — Other Ambulatory Visit: Payer: Self-pay

## 2021-03-01 ENCOUNTER — Telehealth: Payer: Self-pay

## 2021-03-01 ENCOUNTER — Encounter: Payer: Self-pay | Admitting: Cardiology

## 2021-03-01 ENCOUNTER — Ambulatory Visit: Payer: Medicare HMO | Admitting: Cardiology

## 2021-03-01 VITALS — BP 130/74 | HR 68 | Ht 59.0 in | Wt 132.4 lb

## 2021-03-01 DIAGNOSIS — Z01810 Encounter for preprocedural cardiovascular examination: Secondary | ICD-10-CM

## 2021-03-01 DIAGNOSIS — E782 Mixed hyperlipidemia: Secondary | ICD-10-CM

## 2021-03-01 DIAGNOSIS — I2510 Atherosclerotic heart disease of native coronary artery without angina pectoris: Secondary | ICD-10-CM

## 2021-03-01 DIAGNOSIS — I2584 Coronary atherosclerosis due to calcified coronary lesion: Secondary | ICD-10-CM

## 2021-03-01 HISTORY — DX: Atherosclerotic heart disease of native coronary artery without angina pectoris: I25.10

## 2021-03-01 HISTORY — DX: Encounter for preprocedural cardiovascular examination: Z01.810

## 2021-03-01 NOTE — Progress Notes (Signed)
Cardiology Office Note:    Date:  03/01/2021   ID:  Stephanie Lara, DOB February 27, 1946, MRN [id number]  PCP:  Carollee Herter, Alferd Apa, DO  Cardiologist:  Jenean Lindau, MD   Referring MD: Carollee Herter, Alferd Apa, *    ASSESSMENT:    1. Mixed hyperlipidemia   2. Coronary artery calcification   3. Preoperative cardiovascular examination    PLAN:    In order of problems listed above:  Coronary artery calcification: Secondary prevention stressed with the patient.  Importance of compliance with diet medication stressed and she vocalized understanding.  She was advised to continue walking 2 miles a day about 5 days a week. Mixed dyslipidemia: Diet was emphasized.  She has an appointment coming with the lipid clinic for medication such as Nexlizet to prevent injectable PCSK9 medications.  She understands and will keep that appointment. Preoperative risk stratification from a cardiovascular standpoint: I discussed the process with her at length.  I think now that she has started walking and can walk 2 miles a day without any problem she does not need any form of formal stress testing.  She understands.  She walked 2 miles this morning without any symptoms.  Based on the guidelines she is not at high risk for coronary events during the aforementioned surgery.  Medical hemodynamic monitoring will further reduce the risk of coronary events. Patient will be seen in follow-up appointment in 6 weeks or earlier if the patient has any concerns    Medication Adjustments/Labs and Tests Ordered: Current medicines are reviewed at length with the patient today.  Concerns regarding medicines are outlined above.  No orders of the defined types were placed in this encounter.  No orders of the defined types were placed in this encounter.    No chief complaint on file.    History of Present Illness:    Stephanie Lara is a 75 y.o. female.  Patient was diagnosed to have significant coronary artery  calcifications on CT scan.  She denies any problems at this time.  She was posted for stress echo but with contrast at the beginning of the test on the rest images she had significant allergy reactions but she has recovered completely from this.  No chest pain orthopnea or PND.  Patient mentions to me that she walked 2 miles this morning and walks on a regular basis without any symptoms.  No chest pain orthopnea or PND.  At the time of my evaluation, the patient is alert awake oriented and in no distress.  Past Medical History:  Diagnosis Date   Abnormal biliary HIDA scan 02/25/2021   Chest pain 01/06/2021   Chicken pox    Contact dermatitis due to plants, except food 10/20/2008   Overview:  Dermatitis Due To Contact With Plants   Dizzy 06/21/2018   Fatigue 11/11/2016   Globus pharyngeus 04/18/2013   Hot flashes 04/26/2019   Hyperlipidemia    Insomnia 03/03/2015   Lipoma of neck 11/02/2011   Other headache syndrome 06/21/2018   Preventative health care 01/09/2017   Primary insomnia 12/22/2015   Rheumatoid arthritis ([place])    RUQ pain 01/06/2021   Upper respiratory tract infection 05/24/2018   Urinary incontinence 03/03/2015    Past Surgical History:  Procedure Laterality Date   ABDOMINOPLASTY     BREAST BIOPSY     LIPOMA EXCISION     neck   PLACEMENT OF BREAST IMPLANTS     RHINOPLASTY     SEPTOPLASTY  Current Medications: Current Meds  Medication Sig   Ascorbic Acid (VITAMIN C) 1000 MG tablet Take 1,000 mg by mouth daily.   ezetimibe (ZETIA) 10 MG tablet Take 1 tablet (10 mg total) by mouth daily.   Famotidine (PEPCID AC PO) Take 1 tablet by mouth every evening.   LATISSE 0.03 % ophthalmic solution Place 1 drop into both eyes daily.   pantoprazole (PROTONIX) 40 MG tablet Take 1 tablet (40 mg total) by mouth daily.   pravastatin (PRAVACHOL) 10 MG tablet Take 1 tablet (10 mg total) by mouth daily.   traZODone (DESYREL) 50 MG tablet Take 50 mg by mouth at bedtime.     Allergies:    Definity [perflutren lipid microsphere]   Social History   Socioeconomic History   Marital status: Married    Spouse name: Not on file   Number of children: Not on file   Years of education: Not on file   Highest education level: Not on file  Occupational History   Occupation: retired Teacher, 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[employer]  Tobacco Use   Smoking status: Never   Smokeless tobacco: Never  Vaping Use   Vaping Use: Never used  Substance and Sexual Activity   Alcohol use: Yes    Alcohol/week: 0.0 standard drinks    Comment: Occ   Drug use: No   Sexual activity: Not on file  Other Topics Concern   Not on file  Social History Narrative   Not on file   Social Determinants of Health   Financial Resource Strain: Not on file  Food Insecurity: Not on file  Transportation Needs: Not on file  Physical Activity: Not on file  Stress: Not on file  Social Connections: Not on file     Family History: The patient's family history includes Deep vein thrombosis in her father; Diabetes in her paternal grandmother; Gallbladder disease in her father; Heart attack in her father; Heart disease in her father; Hypertension in her brother and mother; Sudden death in her father.  ROS:   Please see the history of present illness.    All other systems reviewed and are negative.  EKGs/Labs/Other Studies Reviewed:    The following studies were reviewed today: I discussed my findings with the patient in extensive length   Recent Labs: 12/22/2020: Hemoglobin 12.9; Platelets 211.0; TSH 2.52 01/11/2021: BUN 21; Creatinine, Ser 0.82; Potassium 4.6; Sodium 142 02/26/2021: ALT 28  Recent Lipid Panel    Component Value Date/Time   CHOL 210 (H) 02/26/2021 1038   TRIG 56 02/26/2021 1038   HDL 75 02/26/2021 1038   CHOLHDL 2.8 02/26/2021 1038   CHOLHDL 4.1 04/06/2020 1350   VLDL 10.6 04/04/2019 1339   LDLCALC 125 (H) 02/26/2021 1038   LDLCALC 190 (H) 04/06/2020 1350    Physical Exam:    VS:  BP 130/74    Pulse 68   Ht 4\' 11"  (1.499 m)   Wt 132 lb 6.4 oz (60.1 kg)   SpO2 97%   BMI 26.74 kg/m     Wt Readings from Last 3 Encounters:  03/01/21 132 lb 6.4 oz (60.1 kg)  01/08/21 126 lb 1.9 oz (57.2 kg)  12/22/20 130 lb 6.4 oz (59.1 kg)     GEN: Patient is in no acute distress HEENT: Normal NECK: No JVD; No carotid bruits LYMPHATICS: No lymphadenopathy CARDIAC: Hear sounds regular, 2/6 systolic murmur at the apex. RESPIRATORY:  Clear to auscultation without rales, wheezing or rhonchi  ABDOMEN: Soft, non-tender, non-distended MUSCULOSKELETAL:  No edema; No  deformity  SKIN: Warm and dry NEUROLOGIC:  Alert and oriented x 3 PSYCHIATRIC:  Normal affect   Signed, Jenean Lindau, MD  03/01/2021 3:07 PM    [facility]

## 2021-03-01 NOTE — Telephone Encounter (Signed)
Spoke with patient regarding results and recommendation.  Patient verbalizes understanding and is agreeable to plan of care. Advised patient to call back with any issues or concerns.  

## 2021-03-01 NOTE — Patient Instructions (Signed)
Medication Instructions:  Your physician recommends that you continue on your current medications as directed. Please refer to the Current Medication list given to you today.  *If you need a refill on your cardiac medications before your next appointment, please call your pharmacy*   Lab Work: None If you have labs (blood work) drawn today and your tests are completely normal, you will receive your results only by: [place] (if you have MyChart) OR A paper copy in the mail If you have any lab test that is abnormal or we need to change your treatment, we will call you to review the results.   Testing/Procedures: None   Follow-Up: At [facility], you and your health needs are our priority.  As part of our continuing mission to provide you with exceptional heart care, we have created designated Provider Care Teams.  These Care Teams include your primary Cardiologist (physician) and Advanced Practice Providers (APPs -  Physician Assistants and Nurse Practitioners) who all work together to provide you with the care you need, when you need it.  We recommend signing up for the patient portal called "MyChart".  Sign up information is provided on this After Visit Summary.  MyChart is used to connect with patients for Virtual Visits (Telemedicine).  Patients are able to view lab/test results, encounter notes, upcoming appointments, etc.  Non-urgent messages can be sent to your provider as well.   To learn more about what you can do with MyChart, go to [email or web address].    Your next appointment:   6 week(s)  The format for your next appointment:   In Person  Provider:   Jyl Heinz, MD   Other Instructions ]

## 2021-03-01 NOTE — Telephone Encounter (Signed)
-----   Message from Jenean Lindau, MD sent at 02/25/2021  4:54 PM EDT ----- Mild to moderate mitral regurgitation.  Otherwise the results of the study is unremarkable. Please inform patient. I will discuss in detail at next appointment. Cc  primary care/referring physician Jenean Lindau, MD 02/25/2021 4:53 PM

## 2021-03-02 ENCOUNTER — Ambulatory Visit: Payer: Medicare HMO

## 2021-03-04 ENCOUNTER — Ambulatory Visit: Payer: Medicare HMO

## 2021-03-04 ENCOUNTER — Other Ambulatory Visit: Payer: Self-pay

## 2021-03-04 ENCOUNTER — Ambulatory Visit: Payer: Medicare HMO | Admitting: Pharmacist

## 2021-03-04 VITALS — BP 136/92 | HR 77 | Resp 14 | Ht 59.0 in | Wt 132.0 lb

## 2021-03-04 DIAGNOSIS — I2584 Coronary atherosclerosis due to calcified coronary lesion: Secondary | ICD-10-CM

## 2021-03-04 DIAGNOSIS — M791 Myalgia, unspecified site: Secondary | ICD-10-CM

## 2021-03-04 DIAGNOSIS — T466X5A Adverse effect of antihyperlipidemic and antiarteriosclerotic drugs, initial encounter: Secondary | ICD-10-CM | POA: Insufficient documentation

## 2021-03-04 DIAGNOSIS — E782 Mixed hyperlipidemia: Secondary | ICD-10-CM | POA: Diagnosis not present

## 2021-03-04 DIAGNOSIS — I2510 Atherosclerotic heart disease of native coronary artery without angina pectoris: Secondary | ICD-10-CM

## 2021-03-04 HISTORY — DX: Adverse effect of antihyperlipidemic and antiarteriosclerotic drugs, initial encounter: M79.10

## 2021-03-04 MED ORDER — ROSUVASTATIN CALCIUM 10 MG PO TABS: 10.0000 mg | ORAL_TABLET | Freq: Every day | ORAL | 0 refills | Status: DC

## 2021-03-04 NOTE — Progress Notes (Signed)
Patient ID: Stephanie Lara                 DOB: 1945-10-07                    MRN: [id number]     HPI: Stephanie Lara is a 75 y.o. female patient referred to lipid clinic by Dr Geraldo Pitter. PMH is significant for HLD, CAD, RA, and statin intolerance.    Patient presents today in good spirits .  Has an extensive family history of CAD.  Father had first MI in his 66s and passed away in his 6s.  Siblings also have CAD.  Has tried pitavastatin and atorvastatin and both made her feel poorly.  Is currently taking pravastatin 10mg  every 3 days and on days when she takes medication she has muscle pain.  Had recent coronary CT which showed "Coronary calcium score of 33. This was 16 th percentile for age and sex matched control."    Follows a heart healthy diet and is starting to exercise more.  Patient is hesitant to start PCSK9i.    Current Medications: pravastatin 10mg  every 3 days Intolerances: atorvastatin, pitavastatin Risk Factors: family history LDL goal: <55 per Dr. Geraldo Pitter  Labs: Coronary calcium score of 33. This was 29 th percentile for age and sex matched control.  TC 210, Trigs 56, HDL 75, LDL 125 (02/26/21 on pravastatin 10mg )   Past Medical History:  Diagnosis Date   Abnormal biliary HIDA scan 02/25/2021   Chest pain 01/06/2021   Chicken pox    Contact dermatitis due to plants, except food 10/20/2008   Overview:  Dermatitis Due To Contact With Plants   Dizzy 06/21/2018   Fatigue 11/11/2016   Globus pharyngeus 04/18/2013   Hot flashes 04/26/2019   Hyperlipidemia    Insomnia 03/03/2015   Lipoma of neck 11/02/2011   Other headache syndrome 06/21/2018   Preventative health care 01/09/2017   Primary insomnia 12/22/2015   Rheumatoid arthritis ([place])    RUQ pain 01/06/2021   Upper respiratory tract infection 05/24/2018   Urinary incontinence 03/03/2015    Current Outpatient Medications on File Prior to Visit  Medication Sig Dispense Refill   Ascorbic Acid (VITAMIN C) 1000 MG tablet Take  1,000 mg by mouth daily.     ezetimibe (ZETIA) 10 MG tablet Take 1 tablet (10 mg total) by mouth daily. 30 tablet 0   Famotidine (PEPCID AC PO) Take 1 tablet by mouth every evening.     LATISSE 0.03 % ophthalmic solution Place 1 drop into both eyes daily.     pantoprazole (PROTONIX) 40 MG tablet Take 1 tablet (40 mg total) by mouth daily. 30 tablet 0   pravastatin (PRAVACHOL) 10 MG tablet Take 1 tablet (10 mg total) by mouth daily. 30 tablet 0   traZODone (DESYREL) 50 MG tablet Take 50 mg by mouth at bedtime.     No current facility-administered medications on file prior to visit.    Allergies  Allergen Reactions   Definity [Perflutren Lipid Microsphere] Other (See Comments)    Severe abdominal Pain   Lipitor [Atorvastatin]     myalgias   Pitavastatin     Myalgias    Pravastatin     myalgias    Assessment/Plan:  1. Hyperlipidemia - Patient LDL is 125 which is above goal of <55.  Patient needs aggressive lipid lowering therapy due to family history and coronary calcium.    Discussed pathophysiology of atherosclerosis and coronary artery disease with patient and  showed educational videos.  Explained mechanism of action of statins vs Nexletol vs PCSK9i.  Described goal of plaque stabilization to reduce risk of cardiac events such as MI.  Patient interested in [place].  Explained mechanism of action but cautioned that is has not yet been proven to reduce risks of MI like statins or PCSK9i.    Using demo pen, educated patient on mechanism of action, storage, site selection, and administration of Repatha/Praluent.  Patient was able to demonstrate use in room and feels more comfortable with it.   Patient would like to try a higher intensity statin first to see if she can tolerate.  Will start rosuvastatin 10mg  daily and titrate up as tolerated.  If patient has adverse effects, will call lipid clinic to start Repatha/Praluent.  Continue Zetia 10mg  daily Stop pravastatin 10mg  Start  rosuvastatin 10mg  daily Call lipid clinic if she can not tolerate  Karren Cobble, PharmD, BCACP, [place], [place] [address], [place], [place] [address] Phone: [phone]; Fax: [phone] 03/04/2021 4:27 PM

## 2021-03-04 NOTE — Patient Instructions (Signed)
It was nice meeting you today!  We would like your LDL (bad cholesterol) to be less than 55  Please discontinue your pravastatin at this time  We will start a new statin medication called rosuvastatin.  We can start at 10mg  daily and increase as tolerated.    If you develop side effects please let me know and we can switch you to one of the injections you take every 2 weeks  Please call with any questions!  Karren Cobble, PharmD, BCACP, [place], [place] [address], [place], [place] [address] Phone: [phone]; Fax: [phone] 03/04/2021 4:00 PM

## 2021-03-04 NOTE — Progress Notes (Deleted)
Patient ID: Stephanie Lara                 DOB: 12-08-1945                    MRN: [id number]     HPI: Stephanie Lara is a 75 y.o. female patient referred to lipid clinic by Dr Geraldo Pitter. PMH is significant for   Current Medications: Zetia 10mg  Intolerances: atorvastatin 10mg , pravastatin 1mg  Risk Factors:  LDL goal:   Diet:   Exercise:   Family History:   Social History:   Labs: TC 210, Trigs 56, HDL 75, LDL 125 (02/26/21 on Zetia)  Coronary calcium score of 33. This was 11 th percentile for age and sex matched control.  Past Medical History:  Diagnosis Date   Abnormal biliary HIDA scan 02/25/2021   Chest pain 01/06/2021   Chicken pox    Contact dermatitis due to plants, except food 10/20/2008   Overview:  Dermatitis Due To Contact With Plants   Dizzy 06/21/2018   Fatigue 11/11/2016   Globus pharyngeus 04/18/2013   Hot flashes 04/26/2019   Hyperlipidemia    Insomnia 03/03/2015   Lipoma of neck 11/02/2011   Other headache syndrome 06/21/2018   Preventative health care 01/09/2017   Primary insomnia 12/22/2015   Rheumatoid arthritis ([place])    RUQ pain 01/06/2021   Upper respiratory tract infection 05/24/2018   Urinary incontinence 03/03/2015    Current Outpatient Medications on File Prior to Visit  Medication Sig Dispense Refill   Ascorbic Acid (VITAMIN C) 1000 MG tablet Take 1,000 mg by mouth daily.     ezetimibe (ZETIA) 10 MG tablet Take 1 tablet (10 mg total) by mouth daily. 30 tablet 0   Famotidine (PEPCID AC PO) Take 1 tablet by mouth every evening.     LATISSE 0.03 % ophthalmic solution Place 1 drop into both eyes daily.     pantoprazole (PROTONIX) 40 MG tablet Take 1 tablet (40 mg total) by mouth daily. 30 tablet 0   pravastatin (PRAVACHOL) 10 MG tablet Take 1 tablet (10 mg total) by mouth daily. 30 tablet 0   traZODone (DESYREL) 50 MG tablet Take 50 mg by mouth at bedtime.     No current facility-administered medications on file prior to visit.    Allergies  Allergen  Reactions   Definity [Perflutren Lipid Microsphere] Other (See Comments)    Severe abdominal Pain    Assessment/Plan:  1. Hyperlipidemia -

## 2021-03-08 DIAGNOSIS — K828 Other specified diseases of gallbladder: Secondary | ICD-10-CM | POA: Diagnosis not present

## 2021-03-08 DIAGNOSIS — R948 Abnormal results of function studies of other organs and systems: Secondary | ICD-10-CM | POA: Diagnosis not present

## 2021-03-08 DIAGNOSIS — K811 Chronic cholecystitis: Secondary | ICD-10-CM | POA: Diagnosis not present

## 2021-03-08 DIAGNOSIS — K660 Peritoneal adhesions (postprocedural) (postinfection): Secondary | ICD-10-CM | POA: Diagnosis not present

## 2021-03-08 DIAGNOSIS — K819 Cholecystitis, unspecified: Secondary | ICD-10-CM | POA: Diagnosis not present

## 2021-03-08 DIAGNOSIS — R1011 Right upper quadrant pain: Secondary | ICD-10-CM | POA: Diagnosis not present

## 2021-03-08 HISTORY — PX: CHOLECYSTECTOMY: SHX55

## 2021-03-09 ENCOUNTER — Telehealth: Payer: Self-pay

## 2021-03-09 DIAGNOSIS — I2510 Atherosclerotic heart disease of native coronary artery without angina pectoris: Secondary | ICD-10-CM

## 2021-03-09 DIAGNOSIS — E782 Mixed hyperlipidemia: Secondary | ICD-10-CM

## 2021-03-09 NOTE — Telephone Encounter (Signed)
Pt called and stated that she saw pavero last week he gave crestor which she didn't tolerate. She asked if she could do [id number] routing to [employer] .

## 2021-03-10 NOTE — Telephone Encounter (Signed)
Stephanie Lara (Key: [id number]) REPATHA PA SUBMITTED FOR 140MG  Q14D  ORDERED LIPID PANEL FOR POST 2 MONTHS

## 2021-03-10 NOTE — Telephone Encounter (Signed)
Patient intolerant to rosuvastatin due to leg pain.  Is interested in starting PCSK9i.  Remembers how to inject.  Dicussed dosing schedule and possible adverse effects again and patient voiced understanding.  Please complete prior authorization for:  Name of medication, dose, and frequency Repatha 140mg  sq q 14days or Praluent 150mg  SQ q 14 days  Lab Orders Requested? yes  Which labs? Lipid panel  Estimated date for labs to be scheduled 2-3 months  Does patient need activated copay card? no

## 2021-03-11 MED ORDER — REPATHA SURECLICK 140 MG/ML ~~LOC~~ SOAJ
140.0000 mg | SUBCUTANEOUS | 11 refills | Status: DC
Start: 2021-03-11 — End: 2021-03-30

## 2021-03-11 NOTE — Addendum Note (Signed)
Addended by: Allean Found on: 03/11/2021 08:29 AM   Modules accepted: Orders

## 2021-03-11 NOTE — Telephone Encounter (Signed)
Called and spoke w/pt and stated that they were approved for repatha, rx sent, pt instructed to completed 2 mos post start labs fasting

## 2021-03-30 ENCOUNTER — Other Ambulatory Visit (HOSPITAL_COMMUNITY): Payer: Medicare HMO

## 2021-03-30 ENCOUNTER — Other Ambulatory Visit: Payer: Self-pay

## 2021-03-30 MED ORDER — REPATHA SURECLICK 140 MG/ML ~~LOC~~ SOAJ: 140.0000 mg | SUBCUTANEOUS | 11 refills | Status: DC

## 2021-04-05 DIAGNOSIS — E782 Mixed hyperlipidemia: Secondary | ICD-10-CM | POA: Diagnosis not present

## 2021-04-05 DIAGNOSIS — I2584 Coronary atherosclerosis due to calcified coronary lesion: Secondary | ICD-10-CM | POA: Diagnosis not present

## 2021-04-05 DIAGNOSIS — I2510 Atherosclerotic heart disease of native coronary artery without angina pectoris: Secondary | ICD-10-CM | POA: Diagnosis not present

## 2021-04-06 ENCOUNTER — Telehealth: Payer: Self-pay | Admitting: Pharmacist

## 2021-04-06 LAB — LIPID PANEL
Chol/HDL Ratio: 2.7 ratio (ref 0.0–4.4)
Cholesterol, Total: 192 mg/dL (ref 100–199)
HDL: 71 mg/dL (ref >39)
LDL Chol Calc (NIH): 108 mg/dL — ABNORMAL HIGH (ref 0–99)
Triglycerides: 72 mg/dL (ref 0–149)
VLDL Cholesterol Cal: 13 mg/dL (ref 5–40)

## 2021-04-06 NOTE — Telephone Encounter (Signed)
LMOM regarding lipid panel

## 2021-04-07 ENCOUNTER — Ambulatory Visit: Payer: Medicare HMO | Admitting: Cardiology

## 2021-04-07 ENCOUNTER — Other Ambulatory Visit: Payer: Self-pay

## 2021-04-07 ENCOUNTER — Encounter: Payer: Self-pay | Admitting: Cardiology

## 2021-04-07 VITALS — BP 134/74 | HR 74 | Ht 59.0 in | Wt 131.8 lb

## 2021-04-07 DIAGNOSIS — I2510 Atherosclerotic heart disease of native coronary artery without angina pectoris: Secondary | ICD-10-CM | POA: Diagnosis not present

## 2021-04-07 DIAGNOSIS — I2584 Coronary atherosclerosis due to calcified coronary lesion: Secondary | ICD-10-CM | POA: Diagnosis not present

## 2021-04-07 DIAGNOSIS — I340 Nonrheumatic mitral (valve) insufficiency: Secondary | ICD-10-CM | POA: Diagnosis not present

## 2021-04-07 DIAGNOSIS — Z23 Encounter for immunization: Secondary | ICD-10-CM | POA: Diagnosis not present

## 2021-04-07 DIAGNOSIS — E782 Mixed hyperlipidemia: Secondary | ICD-10-CM

## 2021-04-07 HISTORY — DX: Nonrheumatic mitral (valve) insufficiency: I34.0

## 2021-04-07 NOTE — Patient Instructions (Signed)

## 2021-04-07 NOTE — Progress Notes (Signed)
Cardiology Office Note:    Date:  04/07/2021   ID:  Stephanie Lara, DOB 01/18/1946, MRN [id number]  PCP:  Carollee Herter, Alferd Apa, DO  Cardiologist:  Jenean Lindau, MD   Referring MD: Carollee Herter, Alferd Apa, *    ASSESSMENT:    1. Coronary artery calcification   2. Mixed hyperlipidemia   3. Mitral valve insufficiency, unspecified etiology    PLAN:    In order of problems listed above:  Coronary artery disease: Secondary prevention stressed with patient.  Importance of compliance with diet and medication stressed and she vocalized understanding.  Questions were answered to her satisfaction.  She was advised to walk after her gastroenterologist to clear in view of abdominal fullness. Mixed dyslipidemia: Diet was emphasized.  Lipids were reviewed her lipids have improved significantly but there is still room for improvement I discussed diet with the patient at length. Moderate mitral regurgitation: Stable at this time.  We will continue to monitor.  Importance of aerobic exercise stressed and she promises to do better. Patient will be seen in follow-up appointment in 6 months or earlier if the patient has any concerns    Medication Adjustments/Labs and Tests Ordered: Current medicines are reviewed at length with the patient today.  Concerns regarding medicines are outlined above.  No orders of the defined types were placed in this encounter.  No orders of the defined types were placed in this encounter.    No chief complaint on file.    History of Present Illness:    Stephanie Lara is a 75 y.o. female.  Patient has past medical history of coronary artery disease, mixed dyslipidemia and moderate mitral regurgitation.  She underwent gallbladder surgery and has not felt completely normal.  She tells me that she has some abdominal fullness.  She is going to see her primary doctor and gastroenterologist for this.  She denies any chest pain orthopnea or PND.  At the time of my  evaluation, the patient is alert awake oriented and in no distress.  Past Medical History:  Diagnosis Date   Abnormal biliary HIDA scan 02/25/2021   Chest pain 01/06/2021   Chicken pox    Contact dermatitis due to plants, except food 10/20/2008   Overview:  Dermatitis Due To Contact With Plants   Coronary artery calcification 03/01/2021   Dizzy 06/21/2018   Fatigue 11/11/2016   Globus pharyngeus 04/18/2013   Hot flashes 04/26/2019   Hyperlipidemia    Insomnia 03/03/2015   Lipoma of neck 11/02/2011   Myalgia due to statin 03/04/2021   Other headache syndrome 06/21/2018   Preoperative cardiovascular examination 03/01/2021   Preventative health care 01/09/2017   Primary insomnia 12/22/2015   Rheumatoid arthritis ([place])    RUQ pain 01/06/2021   Upper respiratory tract infection 05/24/2018   Urinary incontinence 03/03/2015    Past Surgical History:  Procedure Laterality Date   ABDOMINOPLASTY     BREAST BIOPSY     CHOLECYSTECTOMY  03/08/2021   LIPOMA EXCISION     neck   PLACEMENT OF BREAST IMPLANTS     RHINOPLASTY     SEPTOPLASTY      Current Medications: Current Meds  Medication Sig   Ascorbic Acid (VITAMIN C) 1000 MG tablet Take 1,000 mg by mouth daily.   Evolocumab (REPATHA SURECLICK) 333 MG/ML SOAJ Inject 140 mg into the skin every 14 (fourteen) days.   Famotidine (PEPCID AC PO) Take 1 tablet by mouth every evening.   LATISSE 0.03 % ophthalmic solution Place  1 drop into both eyes daily.   pantoprazole (PROTONIX) 40 MG tablet Take 1 tablet (40 mg total) by mouth daily.   traZODone (DESYREL) 50 MG tablet Take 50 mg by mouth at bedtime.     Allergies:   Definity [perflutren lipid microsphere], Lipitor [atorvastatin], Pitavastatin, and Pravastatin   Social History   Socioeconomic History   Marital status: Married    Spouse name: Not on file   Number of children: Not on file   Years of education: Not on file   Highest education level: Not on file  Occupational History   Occupation:  retired Teacher, 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[employer]  Tobacco Use   Smoking status: Never   Smokeless tobacco: Never  Vaping Use   Vaping Use: Never used  Substance and Sexual Activity   Alcohol use: Yes    Alcohol/week: 0.0 standard drinks    Comment: Occ   Drug use: No   Sexual activity: Not on file  Other Topics Concern   Not on file  Social History Narrative   Not on file   Social Determinants of Health   Financial Resource Strain: Not on file  Food Insecurity: Not on file  Transportation Needs: Not on file  Physical Activity: Not on file  Stress: Not on file  Social Connections: Not on file     Family History: The patient's family history includes Deep vein thrombosis in her father; Diabetes in her paternal grandmother; Gallbladder disease in her father; Heart attack in her father; Heart disease in her father; Hypertension in her brother and mother; Sudden death in her father.  ROS:   Please see the history of present illness.    All other systems reviewed and are negative.  EKGs/Labs/Other Studies Reviewed:    The following studies were reviewed today: I discussed findings of lipid work with the patient at length.   Recent Labs: 12/22/2020: Hemoglobin 12.9; Platelets 211.0; TSH 2.52 01/11/2021: BUN 21; Creatinine, Ser 0.82; Potassium 4.6; Sodium 142 02/26/2021: ALT 28  Recent Lipid Panel    Component Value Date/Time   CHOL 192 04/05/2021 0933   TRIG 72 04/05/2021 0933   HDL 71 04/05/2021 0933   CHOLHDL 2.7 04/05/2021 0933   CHOLHDL 4.1 04/06/2020 1350   VLDL 10.6 04/04/2019 1339   LDLCALC 108 (H) 04/05/2021 0933   LDLCALC 190 (H) 04/06/2020 1350    Physical Exam:    VS:  BP 134/74   Pulse 74   Ht 4\' 11"  (1.499 m)   Wt 131 lb 12.8 oz (59.8 kg)   SpO2 98%   BMI 26.62 kg/m     Wt Readings from Last 3 Encounters:  04/07/21 131 lb 12.8 oz (59.8 kg)  03/04/21 132 lb (59.9 kg)  03/01/21 132 lb 6.4 oz (60.1 kg)     GEN: Patient is in no acute distress HEENT:  Normal NECK: No JVD; No carotid bruits LYMPHATICS: No lymphadenopathy CARDIAC: Hear sounds regular, 2/6 systolic murmur at the apex. RESPIRATORY:  Clear to auscultation without rales, wheezing or rhonchi  ABDOMEN: Soft, non-tender, non-distended MUSCULOSKELETAL:  No edema; No deformity  SKIN: Warm and dry NEUROLOGIC:  Alert and oriented x 3 PSYCHIATRIC:  Normal affect   Signed, Jenean Lindau, MD  04/07/2021 2:59 PM    [place]

## 2021-04-23 ENCOUNTER — Ambulatory Visit (INDEPENDENT_AMBULATORY_CARE_PROVIDER_SITE_OTHER): Payer: Medicare HMO | Admitting: Family Medicine

## 2021-04-23 ENCOUNTER — Encounter: Payer: Self-pay | Admitting: Family Medicine

## 2021-04-23 ENCOUNTER — Other Ambulatory Visit: Payer: Self-pay

## 2021-04-23 VITALS — BP 140/100 | HR 63 | Temp 97.6°F | Resp 18 | Ht 59.0 in | Wt 132.6 lb

## 2021-04-23 DIAGNOSIS — R1011 Right upper quadrant pain: Secondary | ICD-10-CM

## 2021-04-23 DIAGNOSIS — R109 Unspecified abdominal pain: Secondary | ICD-10-CM

## 2021-04-23 LAB — POC URINALSYSI DIPSTICK (AUTOMATED)
Bilirubin, UA: NEGATIVE
Blood, UA: NEGATIVE
Glucose, UA: NEGATIVE
Ketones, UA: NEGATIVE
Leukocytes, UA: NEGATIVE
Nitrite, UA: NEGATIVE
Protein, UA: NEGATIVE
Spec Grav, UA: 1.01 (ref 1.010–1.025)
Urobilinogen, UA: 0.2 E.U./dL
pH, UA: 5 (ref 5.0–8.0)

## 2021-04-23 LAB — CBC WITH DIFFERENTIAL/PLATELET
Basophils Absolute: 0 10*3/uL (ref 0.0–0.1)
Basophils Relative: 0.4 % (ref 0.0–3.0)
Eosinophils Absolute: 0 10*3/uL (ref 0.0–0.7)
Eosinophils Relative: 0.6 % (ref 0.0–5.0)
HCT: 38.7 % (ref 36.0–46.0)
Hemoglobin: 13.2 g/dL (ref 12.0–15.0)
Lymphocytes Relative: 26.2 % (ref 12.0–46.0)
Lymphs Abs: 1.2 10*3/uL (ref 0.7–4.0)
MCHC: 34.1 g/dL (ref 30.0–36.0)
MCV: 95.6 fl (ref 78.0–100.0)
Monocytes Absolute: 0.4 10*3/uL (ref 0.1–1.0)
Monocytes Relative: 9 % (ref 3.0–12.0)
Neutro Abs: 2.9 10*3/uL (ref 1.4–7.7)
Neutrophils Relative %: 63.8 % (ref 43.0–77.0)
Platelets: 226 10*3/uL (ref 150.0–400.0)
RBC: 4.05 Mil/uL (ref 3.87–5.11)
RDW: 12.8 % (ref 11.5–15.5)
WBC: 4.6 10*3/uL (ref 4.0–10.5)

## 2021-04-23 LAB — COMPREHENSIVE METABOLIC PANEL
ALT: 37 U/L — ABNORMAL HIGH (ref 0–35)
AST: 23 U/L (ref 0–37)
Albumin: 4.3 g/dL (ref 3.5–5.2)
Alkaline Phosphatase: 56 U/L (ref 39–117)
BUN: 24 mg/dL — ABNORMAL HIGH (ref 6–23)
CO2: 31 mEq/L (ref 19–32)
Calcium: 9.7 mg/dL (ref 8.4–10.5)
Chloride: 103 mEq/L (ref 96–112)
Creatinine, Ser: 0.68 mg/dL (ref 0.40–1.20)
GFR: 85.75 mL/min (ref >60.00)
Glucose, Bld: 90 mg/dL (ref 70–99)
Potassium: 4.6 mEq/L (ref 3.5–5.1)
Sodium: 139 mEq/L (ref 135–145)
Total Bilirubin: 1.6 mg/dL — ABNORMAL HIGH (ref 0.2–1.2)
Total Protein: 6.9 g/dL (ref 6.0–8.3)

## 2021-04-23 NOTE — Progress Notes (Signed)
Subjective:   By signing my name below, I, Zite Okoli, attest that this documentation has been prepared under the direction and in the presence of  Roma Schanz R DO. 04/23/2021    Patient ID: Stephanie Lara, female    DOB: [birth year], 75 y.o.   MRN: [id number]  Chief Complaint  Patient presents with   Abdominal Pain    Pt states sxs have been off and on with gall bladder surgery. Pt states sxs have gotten worse after surgery. Pt reports urine freq, pain on right flank near ribs.     HPI Patient is in today for an office visit. She is accompanied by her husband today.  She had a gallbladder surgery on 03/08/2021.  She reports she has had intermittent pain in her upper right abdominal area since the surgery and although she was told the pain would resolve after a while, the pain has worsened since then.  She also mentions that at the surgery, her incision was longer than it was supposed to be and notes that the pain is located at the site of the incision. She also mentions that a suspected hernia before the surgery but notes that the bulge of skin is no longer there. Her husband mentions that she cannot sit in an upright position for long period of time. She notes that laying down helps to alleviate the pain but sitting down and walking around worsens the pain. Also notes that the pain feels better when she applies pressure on it. Urinalysis is normal.   Past Medical History:  Diagnosis Date   Abnormal biliary HIDA scan 02/25/2021   Chest pain 01/06/2021   Chicken pox    Contact dermatitis due to plants, except food 10/20/2008   Overview:  Dermatitis Due To Contact With Plants   Coronary artery calcification 03/01/2021   Dizzy 06/21/2018   Fatigue 11/11/2016   Globus pharyngeus 04/18/2013   Hot flashes 04/26/2019   Hyperlipidemia    Insomnia 03/03/2015   Lipoma of neck 11/02/2011   Myalgia due to statin 03/04/2021   Other headache syndrome 06/21/2018   Preoperative cardiovascular  examination 03/01/2021   Preventative health care 01/09/2017   Primary insomnia 12/22/2015   Rheumatoid arthritis ([place])    RUQ pain 01/06/2021   Upper respiratory tract infection 05/24/2018   Urinary incontinence 03/03/2015    Past Surgical History:  Procedure Laterality Date   ABDOMINOPLASTY     BREAST BIOPSY     CHOLECYSTECTOMY  03/08/2021   LIPOMA EXCISION     neck   PLACEMENT OF BREAST IMPLANTS     RHINOPLASTY     SEPTOPLASTY      Family History  Problem Relation Age of Onset   Hypertension Mother    Heart disease Father    Sudden death Father    Heart attack Father    Gallbladder disease Father    Deep vein thrombosis Father    Diabetes Paternal Grandmother    Hypertension Brother     Social History   Socioeconomic History   Marital status: Married    Spouse name: Not on file   Number of children: Not on file   Years of education: Not on file   Highest education level: Not on file  Occupational History   Occupation: retired Teacher, 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[employer]  Tobacco Use   Smoking status: Never   Smokeless tobacco: Never  Vaping Use   Vaping Use: Never used  Substance and Sexual Activity   Alcohol use: Yes  Alcohol/week: 0.0 standard drinks    Comment: Occ   Drug use: No   Sexual activity: Not on file  Other Topics Concern   Not on file  Social History Narrative   Not on file   Social Determinants of Health   Financial Resource Strain: Not on file  Food Insecurity: Not on file  Transportation Needs: Not on file  Physical Activity: Not on file  Stress: Not on file  Social Connections: Not on file  Intimate Partner Violence: Not on file    Outpatient Medications Prior to Visit  Medication Sig Dispense Refill   Ascorbic Acid (VITAMIN C) 1000 MG tablet Take 1,000 mg by mouth daily.     Evolocumab (REPATHA SURECLICK) 726 MG/ML SOAJ Inject 140 mg into the skin every 14 (fourteen) days. 2 mL 11   Famotidine (PEPCID AC PO) Take 1 tablet by mouth every  evening.     LATISSE 0.03 % ophthalmic solution Place 1 drop into both eyes daily.     pantoprazole (PROTONIX) 40 MG tablet Take 1 tablet (40 mg total) by mouth daily. 30 tablet 0   traZODone (DESYREL) 50 MG tablet Take 50 mg by mouth at bedtime.     No facility-administered medications prior to visit.    Allergies  Allergen Reactions   Definity [Perflutren Lipid Microsphere] Other (See Comments)    Severe abdominal Pain   Lipitor [Atorvastatin]     myalgias   Pitavastatin     Myalgias    Pravastatin     myalgias    Review of Systems  Constitutional:  Negative for fever.  HENT:  Negative for congestion, ear pain, hearing loss, sinus pain and sore throat.   Eyes:  Negative for blurred vision and pain.  Respiratory:  Negative for cough, sputum production, shortness of breath and wheezing.   Cardiovascular:  Negative for chest pain and palpitations.  Gastrointestinal:  Positive for abdominal pain (right). Negative for blood in stool, constipation, diarrhea, nausea and vomiting.  Genitourinary:  Negative for dysuria, frequency, hematuria and urgency.  Musculoskeletal:  Negative for back pain, falls and myalgias.  Neurological:  Negative for dizziness, sensory change, loss of consciousness, weakness and headaches.  Endo/Heme/Allergies:  Negative for environmental allergies. Does not bruise/bleed easily.  Psychiatric/Behavioral:  Negative for depression and suicidal ideas. The patient is not nervous/anxious and does not have insomnia.       Objective:    Physical Exam Constitutional:      General: She is not in acute distress.    Appearance: Normal appearance. She is not ill-appearing.  HENT:     Head: Normocephalic and atraumatic.     Right Ear: External ear normal.     Left Ear: External ear normal.  Eyes:     Extraocular Movements: Extraocular movements intact.     Pupils: Pupils are equal, round, and reactive to light.  Cardiovascular:     Rate and Rhythm: Normal rate  and regular rhythm.     Pulses: Normal pulses.     Heart sounds: Normal heart sounds. No murmur heard.   No gallop.  Pulmonary:     Effort: Pulmonary effort is normal. No respiratory distress.     Breath sounds: Normal breath sounds. No wheezing, rhonchi or rales.  Abdominal:     General: Bowel sounds are normal. There is distension.     Palpations: Abdomen is soft. There is no mass.     Tenderness: There is abdominal tenderness in the right upper quadrant. There is  no guarding or rebound.     Hernia: No hernia is present.  Musculoskeletal:     Cervical back: Normal range of motion and neck supple.  Lymphadenopathy:     Cervical: No cervical adenopathy.  Skin:    General: Skin is warm and dry.  Neurological:     Mental Status: She is alert and oriented to person, place, and time.  Psychiatric:        Behavior: Behavior normal.    BP (!) 140/100 (BP Location: Right Arm, Patient Position: Sitting, Cuff Size: Normal)   Pulse 63   Temp 97.6 F (36.4 C) (Oral)   Resp 18   Ht _0  (1.499 m)   Wt 132 lb 9.6 oz (60.1 kg)   SpO2 100%   BMI 26.78 kg/m  Wt Readings from Last 3 Encounters:  04/23/21 132 lb 9.6 oz (60.1 kg)  04/07/21 131 lb 12.8 oz (59.8 kg)  03/04/21 132 lb (59.9 kg)    Diabetic Foot Exam - Simple   No data filed    Lab Results  Component Value Date   WBC 5.2 12/22/2020   HGB 12.9 12/22/2020   HCT 37.5 12/22/2020   PLT 211.0 12/22/2020   GLUCOSE 88 01/11/2021   CHOL 192 04/05/2021   TRIG 72 04/05/2021   HDL 71 04/05/2021   LDLCALC 108 (H) 04/05/2021   ALT 28 02/26/2021   AST 24 02/26/2021   NA 142 01/11/2021   K 4.6 01/11/2021   CL 104 01/11/2021   CREATININE 0.82 01/11/2021   BUN 21 01/11/2021   CO2 23 01/11/2021   TSH 2.52 12/22/2020    Lab Results  Component Value Date   TSH 2.52 12/22/2020   Lab Results  Component Value Date   WBC 5.2 12/22/2020   HGB 12.9 12/22/2020   HCT 37.5 12/22/2020   MCV 95.9 12/22/2020   PLT 211.0  12/22/2020   Lab Results  Component Value Date   NA 142 01/11/2021   K 4.6 01/11/2021   CO2 23 01/11/2021   GLUCOSE 88 01/11/2021   BUN 21 01/11/2021   CREATININE 0.82 01/11/2021   BILITOT 1.1 02/26/2021   ALKPHOS 56 02/26/2021   AST 24 02/26/2021   ALT 28 02/26/2021   PROT 6.3 02/26/2021   ALBUMIN 4.3 02/26/2021   CALCIUM 9.5 01/11/2021   EGFR 75 01/11/2021   GFR 74.96 12/22/2020   Lab Results  Component Value Date   CHOL 192 04/05/2021   Lab Results  Component Value Date   HDL 71 04/05/2021   Lab Results  Component Value Date   LDLCALC 108 (H) 04/05/2021   Lab Results  Component Value Date   TRIG 72 04/05/2021   Lab Results  Component Value Date   CHOLHDL 2.7 04/05/2021   No results found for: HGBA1C     Assessment & Plan:   Problem List Items Addressed This Visit       Unprioritized   RUQ pain    Pt recently had GB surgery.  She con't to have RUQ pain and PA at surgeons office said they would not see her again Pt has app with gyn --- she is concerned about ovaries Labs and CT ordered  Go to ER if pain worsens       Other Visit Diagnoses     Right upper quadrant abdominal pain    -  Primary   Relevant Orders   Ambulatory referral to Obstetrics / Gynecology   CBC with Differential/Platelet  CT Abdomen Pelvis W Contrast   Comprehensive metabolic panel   Right flank pain       Relevant Orders   POCT Urinalysis Dipstick (Automated) (Completed)   Urine Culture       No orders of the defined types were placed in this encounter.   I,Zite Okoli,acting as a Education administrator for [employer], DO.,have documented all relevant documentation on the behalf of Ann Held, DO,as directed by  Ann Held, DO while in the presence of Ann Held, DO.   I,  Ann Held DO. , personally preformed the services described in this documentation.  All medical record entries made by the scribe were at my direction and in my  presence.  I have reviewed the chart and discharge instructions (if applicable) and agree that the record reflects my personal performance and is accurate and complete. 04/23/2021

## 2021-04-23 NOTE — Patient Instructions (Signed)

## 2021-04-23 NOTE — Assessment & Plan Note (Signed)
Pt recently had GB surgery.  She con't to have RUQ pain and PA at surgeons office said they would not see her again Pt has app with gyn --- she is concerned about ovaries Labs and CT ordered  Go to ER if pain worsens

## 2021-04-24 ENCOUNTER — Ambulatory Visit (HOSPITAL_BASED_OUTPATIENT_CLINIC_OR_DEPARTMENT_OTHER)
Admission: RE | Admit: 2021-04-24 | Discharge: 2021-04-24 | Disposition: A | Discharge status: Home / Self Care | Payer: Medicare HMO | Source: Ambulatory Visit | Attending: Family Medicine | Admitting: Family Medicine

## 2021-04-24 ENCOUNTER — Other Ambulatory Visit: Payer: Self-pay

## 2021-04-24 ENCOUNTER — Encounter (HOSPITAL_BASED_OUTPATIENT_CLINIC_OR_DEPARTMENT_OTHER): Payer: Self-pay

## 2021-04-24 DIAGNOSIS — Z9889 Other specified postprocedural states: Secondary | ICD-10-CM | POA: Diagnosis not present

## 2021-04-24 DIAGNOSIS — N281 Cyst of kidney, acquired: Secondary | ICD-10-CM | POA: Diagnosis not present

## 2021-04-24 DIAGNOSIS — R1011 Right upper quadrant pain: Secondary | ICD-10-CM

## 2021-04-24 DIAGNOSIS — Z9049 Acquired absence of other specified parts of digestive tract: Secondary | ICD-10-CM | POA: Diagnosis not present

## 2021-04-24 DIAGNOSIS — I700 Atherosclerosis of aorta: Secondary | ICD-10-CM | POA: Diagnosis not present

## 2021-04-24 MED ORDER — IOHEXOL 300 MG/ML  SOLN
80.0000 mL | Freq: Once | INTRAMUSCULAR | Status: AC | PRN
  Administered 2021-04-24: 80 mL via INTRAVENOUS

## 2021-04-25 LAB — URINE CULTURE
MICRO NUMBER:: 12626414
Result:: NO GROWTH
SPECIMEN QUALITY:: ADEQUATE

## 2021-09-24 ENCOUNTER — Telehealth: Payer: Self-pay | Admitting: Cardiology

## 2021-09-24 ENCOUNTER — Ambulatory Visit: Payer: Medicare HMO | Admitting: Cardiology

## 2021-09-24 VITALS — BP 124/70 | HR 66 | Ht 59.0 in | Wt 122.0 lb

## 2021-09-24 DIAGNOSIS — R079 Chest pain, unspecified: Secondary | ICD-10-CM | POA: Diagnosis not present

## 2021-09-24 DIAGNOSIS — I259 Chronic ischemic heart disease, unspecified: Secondary | ICD-10-CM | POA: Diagnosis not present

## 2021-09-24 DIAGNOSIS — I2584 Coronary atherosclerosis due to calcified coronary lesion: Secondary | ICD-10-CM | POA: Diagnosis not present

## 2021-09-24 DIAGNOSIS — M069 Rheumatoid arthritis, unspecified: Secondary | ICD-10-CM | POA: Diagnosis not present

## 2021-09-24 DIAGNOSIS — I340 Nonrheumatic mitral (valve) insufficiency: Secondary | ICD-10-CM | POA: Diagnosis not present

## 2021-09-24 DIAGNOSIS — I2510 Atherosclerotic heart disease of native coronary artery without angina pectoris: Secondary | ICD-10-CM | POA: Diagnosis not present

## 2021-09-24 DIAGNOSIS — E782 Mixed hyperlipidemia: Secondary | ICD-10-CM

## 2021-09-24 MED ORDER — ASPIRIN EC 81 MG PO TBEC: 81.0000 mg | DELAYED_RELEASE_TABLET | Freq: Every day | ORAL | 3 refills | Status: AC

## 2021-09-24 MED ORDER — NITROGLYCERIN 0.4 MG SL SUBL: 0.4000 mg | SUBLINGUAL_TABLET | SUBLINGUAL | 6 refills | Status: DC | PRN

## 2021-09-24 NOTE — Patient Instructions (Signed)
Medication Instructions:  ?Your physician has recommended you make the following change in your medication:  ? ?Start 81 mg aspirin daily. ? ?Use nitroglycerin 1 tablet placed under the tongue at the first sign of chest pain or an angina attack. 1 tablet may be used every 5 minutes as needed, for up to 15 minutes. Do not take more than 3 tablets in 15 minutes. If pain persist call 911 or go to the nearest ED. ? ? ?*If you need a refill on your cardiac medications before your next appointment, please call your pharmacy* ? ? ?Lab Work: ?None ordered ?If you have labs (blood work) drawn today and your tests are completely normal, you will receive your results only by: ?MyChart Message (if you have MyChart) OR ?A paper copy in the mail ?If you have any lab test that is abnormal or we need to change your treatment, we will call you to review the results. ? ? ?Testing/Procedures: ?Your physician has requested that you have a lexiscan myoview. For further information please visit [email or web address]. Please follow instruction sheet, as given. ? ?The test will take approximately 3 to 4 hours to complete; you may bring reading material.  If someone comes with you to your appointment, they will need to remain in the main lobby due to limited space in the testing area.  ? ?How to prepare for your Myocardial Perfusion Test: ?Do not eat or drink 3 hours prior to your test, except you may have water. ?Do not consume products containing caffeine (regular or decaffeinated) 12 hours prior to your test. (ex: coffee, chocolate, sodas, tea). ?Do bring a list of your current medications with you.  If not listed below, you may take your medications as normal. ?Do wear comfortable clothes (no dresses or overalls) and walking shoes, tennis shoes preferred (No heels or open toe shoes are allowed). ?Do NOT wear cologne, perfume, aftershave, or lotions (deodorant is allowed). ?If these instructions are not followed, your test will have to be  rescheduled. ? ? ? ?Follow-Up: ?At [facility], you and your health needs are our priority.  As part of our continuing mission to provide you with exceptional heart care, we have created designated Provider Care Teams.  These Care Teams include your primary Cardiologist (physician) and Advanced Practice Providers (APPs -  Physician Assistants and Nurse Practitioners) who all work together to provide you with the care you need, when you need it. ? ?We recommend signing up for the patient portal called "MyChart".  Sign up information is provided on this After Visit Summary.  MyChart is used to connect with patients for Virtual Visits (Telemedicine).  Patients are able to view lab/test results, encounter notes, upcoming appointments, etc.  Non-urgent messages can be sent to your provider as well.   ?To learn more about what you can do with MyChart, go to [email or web address].   ? ?Your next appointment:   ?1 month(s) ? ?The format for your next appointment:   ?In Person ? ?Provider:   ?Jyl Heinz, MD ? ? ?Other Instructions ?Cardiac Nuclear Scan ?A cardiac nuclear scan is a test that is done to check the flow of blood to your heart. It is done when you are resting and when you are exercising. The test looks for problems such as: ?Not enough blood reaching a portion of the heart. ?The heart muscle not working as it should. ?You may need this test if: ?You have heart disease. ?You have had lab results that are not normal. ?You  have had heart surgery or a balloon procedure to open up blocked arteries (angioplasty). ?You have chest pain. ?You have shortness of breath. ?In this test, a special dye (tracer) is put into your bloodstream. The tracer will travel to your heart. A camera will then take pictures of your heart to see how the tracer moves through your heart. This test is usually done at a hospital and takes 2-4 hours. ?Tell a doctor about: ?Any allergies you have. ?All medicines you are taking, including  vitamins, herbs, eye drops, creams, and over-the-counter medicines. ?Any problems you or family members have had with anesthetic medicines. ?Any blood disorders you have. ?Any surgeries you have had. ?Any medical conditions you have. ?Whether you are pregnant or may be pregnant. ?What are the risks? ?Generally, this is a safe test. However, problems may occur, such as: ?Serious chest pain and heart attack. This is only a risk if the stress portion of the test is done. ?Rapid heartbeat. ?A feeling of warmth in your chest. This feeling usually does not last long. ?Allergic reaction to the tracer. ?What happens before the test? ?Ask your doctor about changing or stopping your normal medicines. This is important. ?Follow instructions from your doctor about what you cannot eat or drink. ?Remove your jewelry on the day of the test. ?What happens during the test? ?An IV tube will be inserted into one of your veins. ?Your doctor will give you a small amount of tracer through the IV tube. ?You will wait for 20-40 minutes while the tracer moves through your bloodstream. ?Your heart will be monitored with an electrocardiogram (ECG). ?You will lie down on an exam table. ?Pictures of your heart will be taken for about 15-20 minutes. ?You may also have a stress test. For this test, one of these things may be done: ?You will be asked to exercise on a treadmill or a stationary bike. ?You will be given medicines that will make your heart work harder. This is done if you are unable to exercise. ?When blood flow to your heart has peaked, a tracer will again be given through the IV tube. ?After 20-40 minutes, you will get back on the exam table. More pictures will be taken of your heart. ?Depending on the tracer that is used, more pictures may need to be taken 3-4 hours later. ?Your IV tube will be removed when the test is over. ?The test may vary among doctors and hospitals. ?What happens after the test? ?Ask your doctor: ?Whether you  can return to your normal schedule, including diet, activities, and medicines. ?Whether you should drink more fluids. This will help to remove the tracer from your body. Drink enough fluid to keep your pee (urine) pale yellow. ?Ask your doctor, or the department that is doing the test: ?When will my results be ready? ?How will I get my results? ?Summary ?A cardiac nuclear scan is a test that is done to check the flow of blood to your heart. ?Tell your doctor whether you are pregnant or may be pregnant. ?Before the test, ask your doctor about changing or stopping your normal medicines. This is important. ?Ask your doctor whether you can return to your normal activities. You may be asked to drink more fluids. ?This information is not intended to replace advice given to you by your health care provider. Make sure you discuss any questions you have with your health care provider. ?Document Revised: 09/19/2018 Document Reviewed: 11/13/2017 ?Elsevier Patient Education ? Register. ? ?  Nitroglycerin Sublingual Tablets ?What is this medication? ?NITROGLYCERIN (nye troe GLI ser in) prevents and treats chest pain (angina). It works by relaxing blood vessels, which decreases the amount of work the heart has to do. It belongs to a group of medications called nitrates. ?This medicine may be used for other purposes; ask your health care provider or pharmacist if you have questions. ?COMMON BRAND NAME(S): Nitroquick, Nitrostat, Nitrotab ?What should I tell my care team before I take this medication? ?They need to know if you have any of these conditions: ?Anemia ?Head injury, recent stroke, or bleeding in the brain ?Liver disease ?Previous heart attack ?An unusual or allergic reaction to nitroglycerin, other medications, foods, dyes, or preservatives ?Pregnant or trying to get pregnant ?Breast-feeding ?How should I use this medication? ?Take this medication by mouth as needed. Use at the first sign of an angina attack  (chest pain or tightness). You can also take this medication 5 to 10 minutes before an event likely to produce chest pain. Follow the directions exactly as written on the prescription label. Place one tabl

## 2021-09-24 NOTE — Telephone Encounter (Signed)
Pt c/o of Chest Pain: STAT if CP now or developed within 24 hours ? ?1. Are you having CP right now? Sometimes it feels like chest discomfort goes up to her neck, other times it feels like indigestion  ? ?2. Are you experiencing any other symptoms (ex. SOB, nausea, vomiting, sweating)? no ? ?3. How long have you been experiencing CP? 5 to 6 days off and on  ? ?4. Is your CP continuous or coming and going? Comes and goes.  ? ?5. Have you taken Nitroglycerin? No, doesn't have any.  ??  ?

## 2021-09-24 NOTE — Progress Notes (Signed)
?Cardiology Office Note:   ? ?Date:  09/24/2021  ? ?ID:  Stephanie Lara, DOB [birth year], MRN [id number] ? ?PCP:  Ann Held, DO  ?Cardiologist:  Jenean Lindau, MD  ? ?Referring MD: Carollee Herter, Alferd Apa, *  ? ? ?ASSESSMENT:   ? ?1. Coronary artery calcification   ?2. Mitral valve insufficiency, unspecified etiology   ?3. Mixed hyperlipidemia   ? ?PLAN:   ? ?In order of problems listed above: ? ?Chest tightness: I reassured her about my findings.  They are atypical for coronary etiology especially with her effort tolerance.  However in view of existing disease she is concerned about it and I recommended following to her.  She was advised to take a coated baby aspirin on a daily basis.  Sublingual nitroglycerin prescription was sent, its protocol and 911 protocol explained and the patient vocalized understanding questions were answered to the patient's satisfaction.  She knows to go to the nearest emergency room for any concerning symptoms.  We will set her up for a exercise stress Cardiolite to assess her symptoms. ?Mixed dyslipidemia: Diet was emphasized and lifestyle modification urged.  Lipids were reviewed.  Questions were answered to her satisfaction. ?Mild to moderate mitral regurgitation: Stable at this time and medical management. ?Patient will be seen in follow-up appointment in 6 months or earlier if the patient has any concerns ? ? ? ?Medication Adjustments/Labs and Tests Ordered: ?Current medicines are reviewed at length with the patient today.  Concerns regarding medicines are outlined above.  ?No orders of the defined types were placed in this encounter. ? ?No orders of the defined types were placed in this encounter. ? ? ? ?No chief complaint on file. ?  ? ?History of Present Illness:   ? ?Stephanie Lara is a 76 y.o. female.  Patient has past medical history of coronary artery calcification, mild to moderate mitral regurgitation and mixed dyslipidemia.  She mentions to me that she has had  COVID infection subsequently she has some chest tightness at times and she is concerned about it.  No orthopnea or PND.  She walked a mile yesterday without much symptoms.  She is concerned about her chest tightness.  She tells me when it happens she feels it goes to the neck.  At the time of my evaluation, the patient is alert awake oriented and in no distress. ? ?Past Medical History:  ?Diagnosis Date  ? Abnormal biliary HIDA scan 02/25/2021  ? Chest pain 01/06/2021  ? Chicken pox   ? Contact dermatitis due to plants, except food 10/20/2008  ? Overview:  Dermatitis Due To Contact With Plants  ? Coronary artery calcification 03/01/2021  ? Dizzy 06/21/2018  ? Fatigue 11/11/2016  ? Globus pharyngeus 04/18/2013  ? Hot flashes 04/26/2019  ? Hyperlipidemia   ? Insomnia 03/03/2015  ? Lipoma of neck 11/02/2011  ? Mitral regurgitation 04/07/2021  ? Myalgia due to statin 03/04/2021  ? Other headache syndrome 06/21/2018  ? Preoperative cardiovascular examination 03/01/2021  ? Preventative health care 01/09/2017  ? Primary insomnia 12/22/2015  ? Rheumatoid arthritis ([place])   ? RUQ pain 01/06/2021  ? Upper respiratory tract infection 05/24/2018  ? Urinary incontinence 03/03/2015  ? ? ?Past Surgical History:  ?Procedure Laterality Date  ? ABDOMINOPLASTY    ? BREAST BIOPSY    ? CHOLECYSTECTOMY  03/08/2021  ? LIPOMA EXCISION    ? neck  ? PLACEMENT OF BREAST IMPLANTS    ? RHINOPLASTY    ? SEPTOPLASTY    ? ? ?  Current Medications: ?Current Meds  ?Medication Sig  ? Ascorbic Acid (VITAMIN C) 1000 MG tablet Take 1,000 mg by mouth daily.  ? Evolocumab (REPATHA SURECLICK) 323 MG/ML SOAJ Inject 140 mg into the skin every 14 (fourteen) days.  ? LATISSE 0.03 % ophthalmic solution Place 1 drop into both eyes daily.  ? pantoprazole (PROTONIX) 40 MG tablet Take 1 tablet (40 mg total) by mouth daily.  ? traZODone (DESYREL) 50 MG tablet Take 50 mg by mouth at bedtime.  ?  ? ?Allergies:   Definity [perflutren lipid microsphere], Lipitor [atorvastatin], Pitavastatin,  and Pravastatin  ? ?Social History  ? ?Socioeconomic History  ? Marital status: Married  ?  Spouse name: Not on file  ? Number of children: Not on file  ? Years of education: Not on file  ? Highest education level: Not on file  ?Occupational History  ? Occupation: retired Teacher, 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[employer]  ?Tobacco Use  ? Smoking status: Never  ? Smokeless tobacco: Never  ?Vaping Use  ? Vaping Use: Never used  ?Substance and Sexual Activity  ? Alcohol use: Yes  ?  Alcohol/week: 0.0 standard drinks  ?  Comment: Occ  ? Drug use: No  ? Sexual activity: Not on file  ?Other Topics Concern  ? Not on file  ?Social History Narrative  ? Not on file  ? ?Social Determinants of Health  ? ?Financial Resource Strain: Not on file  ?Food Insecurity: Not on file  ?Transportation Needs: Not on file  ?Physical Activity: Not on file  ?Stress: Not on file  ?Social Connections: Not on file  ?  ? ?Family History: ?The patient's family history includes Deep vein thrombosis in her father; Diabetes in her paternal grandmother; Gallbladder disease in her father; Heart attack in her father; Heart disease in her father; Hypertension in her brother and mother; Sudden death in her father. ? ?ROS:   ?Please see the history of present illness.    ?All other systems reviewed and are negative. ? ?EKGs/Labs/Other Studies Reviewed:   ? ?The following studies were reviewed today: ?EKG reveals sinus rhythm and nonspecific ST-T changes ? ? ?Recent Labs: ?12/22/2020: TSH 2.52 ?04/23/2021: ALT 37; BUN 24; Creatinine, Ser 0.68; Hemoglobin 13.2; Platelets 226.0; Potassium 4.6; Sodium 139  ?Recent Lipid Panel ?   ?Component Value Date/Time  ? CHOL 192 04/05/2021 0933  ? TRIG 72 04/05/2021 0933  ? HDL 71 04/05/2021 0933  ? CHOLHDL 2.7 04/05/2021 0933  ? CHOLHDL 4.1 04/06/2020 1350  ? VLDL 10.6 04/04/2019 1339  ? [place] 108 (H) 04/05/2021 0933  ? LDLCALC 190 (H) 04/06/2020 1350  ? ? ?Physical Exam:   ? ?VS:  BP 124/70   Pulse 66   Ht '4\' 11"'$  (1.499 m)   Wt 122 lb  (55.3 kg)   SpO2 98%   BMI 24.64 kg/m?    ? ?Wt Readings from Last 3 Encounters:  ?09/24/21 122 lb (55.3 kg)  ?04/23/21 132 lb 9.6 oz (60.1 kg)  ?04/07/21 131 lb 12.8 oz (59.8 kg)  ?  ? ?GEN: Patient is in no acute distress ?HEENT: Normal ?NECK: No JVD; No carotid bruits ?LYMPHATICS: No lymphadenopathy ?CARDIAC: Hear sounds regular, 2/6 systolic murmur at the apex. ?RESPIRATORY:  Clear to auscultation without rales, wheezing or rhonchi  ?ABDOMEN: Soft, non-tender, non-distended ?MUSCULOSKELETAL:  No edema; No deformity  ?SKIN: Warm and dry ?NEUROLOGIC:  Alert and oriented x 3 ?PSYCHIATRIC:  Normal affect  ? ?Signed, ?Jenean Lindau, MD  ?09/24/2021 2:32 PM    ?  [place]  ?

## 2021-09-24 NOTE — Telephone Encounter (Signed)
Called pt to advise per Dr. Julien Nordmann note. She had already made an appt to see Dr. Geraldo Pitter in [place] today at 2:40PM.  ?

## 2021-09-27 ENCOUNTER — Ambulatory Visit (HOSPITAL_COMMUNITY): Payer: Medicare HMO | Attending: Internal Medicine

## 2021-09-27 DIAGNOSIS — I2510 Atherosclerotic heart disease of native coronary artery without angina pectoris: Secondary | ICD-10-CM | POA: Insufficient documentation

## 2021-09-27 DIAGNOSIS — I340 Nonrheumatic mitral (valve) insufficiency: Secondary | ICD-10-CM | POA: Diagnosis not present

## 2021-09-27 DIAGNOSIS — I2584 Coronary atherosclerosis due to calcified coronary lesion: Secondary | ICD-10-CM | POA: Diagnosis not present

## 2021-09-27 DIAGNOSIS — R079 Chest pain, unspecified: Secondary | ICD-10-CM | POA: Diagnosis not present

## 2021-09-27 LAB — MYOCARDIAL PERFUSION IMAGING
Angina Index: 0
Duke Treadmill Score: 8
Estimated workload: 9.3
Exercise duration (min): 7 min
Exercise duration (sec): 30 s
LV dias vol: 44 mL (ref 46–106)
LV sys vol: 12 mL
MPHR: 146 {beats}/min
Nuc Stress EF: 72 %
Peak HR: 134 {beats}/min
Percent HR: 91 %
Rest HR: 66 {beats}/min
Rest Nuclear Isotope Dose: 10.3 mCi
SDS: 0
SRS: 0
SSS: 0
ST Depression (mm): 0.5 mm
Stress Nuclear Isotope Dose: 32.9 mCi
TID: 0.87

## 2021-09-27 MED ORDER — TECHNETIUM TC 99M TETROFOSMIN IV KIT
32.9000 | PACK | Freq: Once | INTRAVENOUS | Status: AC | PRN
  Administered 2021-09-27: 32.9 via INTRAVENOUS
  Filled 2021-09-27: qty 33

## 2021-09-27 MED ORDER — TECHNETIUM TC 99M TETROFOSMIN IV KIT
10.3000 | PACK | Freq: Once | INTRAVENOUS | Status: AC | PRN
  Administered 2021-09-27: 10.3 via INTRAVENOUS
  Filled 2021-09-27: qty 11

## 2021-10-07 ENCOUNTER — Ambulatory Visit: Payer: Medicare HMO | Admitting: Cardiology

## 2021-10-19 ENCOUNTER — Other Ambulatory Visit: Payer: Self-pay

## 2021-10-26 ENCOUNTER — Ambulatory Visit: Payer: Medicare HMO | Admitting: Cardiology

## 2021-10-26 ENCOUNTER — Encounter: Payer: Self-pay | Admitting: Cardiology

## 2021-10-26 VITALS — BP 146/76 | HR 68 | Ht 59.0 in | Wt 125.1 lb

## 2021-10-26 DIAGNOSIS — Z1321 Encounter for screening for nutritional disorder: Secondary | ICD-10-CM | POA: Diagnosis not present

## 2021-10-26 DIAGNOSIS — I2510 Atherosclerotic heart disease of native coronary artery without angina pectoris: Secondary | ICD-10-CM | POA: Diagnosis not present

## 2021-10-26 DIAGNOSIS — E782 Mixed hyperlipidemia: Secondary | ICD-10-CM | POA: Diagnosis not present

## 2021-10-26 DIAGNOSIS — Z131 Encounter for screening for diabetes mellitus: Secondary | ICD-10-CM | POA: Diagnosis not present

## 2021-10-26 DIAGNOSIS — R5383 Other fatigue: Secondary | ICD-10-CM | POA: Diagnosis not present

## 2021-10-26 DIAGNOSIS — I2584 Coronary atherosclerosis due to calcified coronary lesion: Secondary | ICD-10-CM

## 2021-10-26 DIAGNOSIS — Z1329 Encounter for screening for other suspected endocrine disorder: Secondary | ICD-10-CM | POA: Diagnosis not present

## 2021-10-26 NOTE — Progress Notes (Signed)
?Cardiology Office Note:   ? ?Date:  10/26/2021  ? ?ID:  Stephanie Lara, DOB Jan 25, 1946, MRN [id number] ? ?PCP:  Ann Held, DO  ?Cardiologist:  Jenean Lindau, MD  ? ?Referring MD: Carollee Herter, Alferd Apa, *  ? ? ?ASSESSMENT:   ? ?1. Coronary artery calcification   ?2. Mixed hyperlipidemia   ? ?PLAN:   ? ?In order of problems listed above: ? ?Coronary artery calcification: Secondary prevention stressed with the patient.  Importance of compliance with diet and medication stressed and she vocalized understanding.  She was advised to walk at least half an hour a day 5 days a week and she promises to do so. ?Mixed dyslipidemia: Diet was emphasized.  She is statin intolerant and is taking injectable PCSK9 medications.  She will be back in the next few days for complete blood work.  She requests hemoglobin A1c and vitamin D because of deficiency history and we will do this for her. ?Patient will be seen in follow-up appointment in 9 months or earlier if the patient has any concerns ? ? ? ?Medication Adjustments/Labs and Tests Ordered: ?Current medicines are reviewed at length with the patient today.  Concerns regarding medicines are outlined above.  ?No orders of the defined types were placed in this encounter. ? ?No orders of the defined types were placed in this encounter. ? ? ? ?No chief complaint on file. ?  ? ?History of Present Illness:   ? ?Stephanie Lara is a 76 y.o. female.  Patient has past medical history of coronary artery calcification and mixed dyslipidemia.  She denies any problems at this time and takes care of activities of daily living.  She has chest pain at times and underwent stress testing which was unremarkable.  The results of this were discussed with her at length.  She is now exercising on a regular basis.  At the time of my evaluation, the patient is alert awake oriented and in no distress. ? ?Past Medical History:  ?Diagnosis Date  ? Abnormal biliary HIDA scan 02/25/2021  ? Chest pain  01/06/2021  ? Chicken pox   ? Contact dermatitis due to plants, except food 10/20/2008  ? Overview:  Dermatitis Due To Contact With Plants  ? Coronary artery calcification 03/01/2021  ? Dizzy 06/21/2018  ? Fatigue 11/11/2016  ? Globus pharyngeus 04/18/2013  ? Hot flashes 04/26/2019  ? Hyperlipidemia   ? Insomnia 03/03/2015  ? Lipoma of neck 11/02/2011  ? Mitral regurgitation 04/07/2021  ? Myalgia due to statin 03/04/2021  ? Other headache syndrome 06/21/2018  ? Preoperative cardiovascular examination 03/01/2021  ? Preventative health care 01/09/2017  ? Primary insomnia 12/22/2015  ? Rheumatoid arthritis ([place])   ? RUQ pain 01/06/2021  ? Upper respiratory tract infection 05/24/2018  ? Urinary incontinence 03/03/2015  ? ? ?Past Surgical History:  ?Procedure Laterality Date  ? ABDOMINOPLASTY    ? BREAST BIOPSY    ? CHOLECYSTECTOMY  03/08/2021  ? LIPOMA EXCISION    ? neck  ? PLACEMENT OF BREAST IMPLANTS    ? RHINOPLASTY    ? SEPTOPLASTY    ? ? ?Current Medications: ?Current Meds  ?Medication Sig  ? Ascorbic Acid (VITAMIN C) 1000 MG tablet Take 1,000 mg by mouth daily.  ? aspirin EC 81 MG tablet Take 1 tablet (81 mg total) by mouth daily. Swallow whole.  ? Evolocumab (REPATHA SURECLICK) 188 MG/ML SOAJ Inject 140 mg into the skin every 14 (fourteen) days.  ? LATISSE 0.03 % ophthalmic  solution Place 1 drop into both eyes daily.  ? nitroGLYCERIN (NITROSTAT) 0.4 MG SL tablet Place 0.4 mg under the tongue every 5 (five) minutes as needed for chest pain.  ? pantoprazole (PROTONIX) 40 MG tablet Take 1 tablet (40 mg total) by mouth daily.  ? traZODone (DESYREL) 50 MG tablet Take 50 mg by mouth at bedtime.  ?  ? ?Allergies:   Definity [perflutren lipid microsphere], Lipitor [atorvastatin], Pitavastatin, and Pravastatin  ? ?Social History  ? ?Socioeconomic History  ? Marital status: Married  ?  Spouse name: Not on file  ? Number of children: Not on file  ? Years of education: Not on file  ? Highest education level: Not on file  ?Occupational History   ? Occupation: retired Teacher, 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[employer]  ?Tobacco Use  ? Smoking status: Never  ? Smokeless tobacco: Never  ?Vaping Use  ? Vaping Use: Never used  ?Substance and Sexual Activity  ? Alcohol use: Yes  ?  Alcohol/week: 0.0 standard drinks  ?  Comment: Occ  ? Drug use: No  ? Sexual activity: Not on file  ?Other Topics Concern  ? Not on file  ?Social History Narrative  ? Not on file  ? ?Social Determinants of Health  ? ?Financial Resource Strain: Not on file  ?Food Insecurity: Not on file  ?Transportation Needs: Not on file  ?Physical Activity: Not on file  ?Stress: Not on file  ?Social Connections: Not on file  ?  ? ?Family History: ?The patient's family history includes Deep vein thrombosis in her father; Diabetes in her paternal grandmother; Gallbladder disease in her father; Heart attack in her father; Heart disease in her father; Hypertension in her brother and mother; Sudden death in her father. ? ?ROS:   ?Please see the history of present illness.    ?All other systems reviewed and are negative. ? ?EKGs/Labs/Other Studies Reviewed:   ? ?The following studies were reviewed today: ?Results of stress test discussed with her.  They were within normal limits. ? ? ?Recent Labs: ?12/22/2020: TSH 2.52 ?04/23/2021: ALT 37; BUN 24; Creatinine, Ser 0.68; Hemoglobin 13.2; Platelets 226.0; Potassium 4.6; Sodium 139  ?Recent Lipid Panel ?   ?Component Value Date/Time  ? CHOL 192 04/05/2021 0933  ? TRIG 72 04/05/2021 0933  ? HDL 71 04/05/2021 0933  ? CHOLHDL 2.7 04/05/2021 0933  ? CHOLHDL 4.1 04/06/2020 1350  ? VLDL 10.6 04/04/2019 1339  ? [place] 108 (H) 04/05/2021 0933  ? LDLCALC 190 (H) 04/06/2020 1350  ? ? ?Physical Exam:   ? ?VS:  BP (!) 146/76   Pulse 68   Ht '4\' 11"'$  (1.499 m)   Wt 125 lb 1.9 oz (56.8 kg)   SpO2 99%   BMI 25.27 kg/m?    ? ?Wt Readings from Last 3 Encounters:  ?10/26/21 125 lb 1.9 oz (56.8 kg)  ?09/27/21 122 lb (55.3 kg)  ?09/24/21 122 lb (55.3 kg)  ?  ? ?GEN: Patient is in no acute  distress ?HEENT: Normal ?NECK: No JVD; No carotid bruits ?LYMPHATICS: No lymphadenopathy ?CARDIAC: Hear sounds regular, 2/6 systolic murmur at the apex. ?RESPIRATORY:  Clear to auscultation without rales, wheezing or rhonchi  ?ABDOMEN: Soft, non-tender, non-distended ?MUSCULOSKELETAL:  No edema; No deformity  ?SKIN: Warm and dry ?NEUROLOGIC:  Alert and oriented x 3 ?PSYCHIATRIC:  Normal affect  ? ?Signed, ?Jenean Lindau, MD  ?10/26/2021 1:14 PM    ?[place]  ?

## 2021-10-26 NOTE — Patient Instructions (Signed)
Medication Instructions:  Your physician recommends that you continue on your current medications as directed. Please refer to the Current Medication list given to you today.  *If you need a refill on your cardiac medications before your next appointment, please call your pharmacy*   Lab Work: Your physician recommends that you return for lab work in: the next few days. You need to have labs done when you are fasting. You do not need to make an appointment as the order has already been placed. The labs you are going to have done are BMET, CBC, TSH, LFT and Lipids.  [facility] [address] in [place]. They also close daily for lunch for 12-1.   [facility] [address] 2nd floor M-W 8-11:30 and 1-4:30 and Thursday and Friday 8-11:30.   If you have labs (blood work) drawn today and your tests are completely normal, you will receive your results only by: MyChart Message (if you have MyChart) OR A paper copy in the mail If you have any lab test that is abnormal or we need to change your treatment, we will call you to review the results.   Testing/Procedures: None ordered   Follow-Up: At [facility], you and your health needs are our priority.  As part of our continuing mission to provide you with exceptional heart care, we have created designated Provider Care Teams.  These Care Teams include your primary Cardiologist (physician) and Advanced Practice Providers (APPs -  Physician Assistants and Nurse Practitioners) who all work together to provide you with the care you need, when you need it.  We recommend signing up for the patient portal called "MyChart".  Sign up information is provided on this After Visit Summary.  MyChart is used to connect with patients for Virtual Visits (Telemedicine).  Patients are able to view lab/test results, encounter notes, upcoming appointments, etc.  Non-urgent messages can be sent to your provider as well.   To learn more about what you  can do with MyChart, go to [email or web address].    Your next appointment:   9 month(s)  The format for your next appointment:   In Person  Provider:   Rajan Revankar, MD   Other Instructions NA  

## 2021-10-29 DIAGNOSIS — I2584 Coronary atherosclerosis due to calcified coronary lesion: Secondary | ICD-10-CM | POA: Diagnosis not present

## 2021-10-29 DIAGNOSIS — Z1321 Encounter for screening for nutritional disorder: Secondary | ICD-10-CM | POA: Diagnosis not present

## 2021-10-29 DIAGNOSIS — R5383 Other fatigue: Secondary | ICD-10-CM | POA: Diagnosis not present

## 2021-10-29 DIAGNOSIS — Z131 Encounter for screening for diabetes mellitus: Secondary | ICD-10-CM | POA: Diagnosis not present

## 2021-10-29 DIAGNOSIS — I2510 Atherosclerotic heart disease of native coronary artery without angina pectoris: Secondary | ICD-10-CM | POA: Diagnosis not present

## 2021-10-29 DIAGNOSIS — E782 Mixed hyperlipidemia: Secondary | ICD-10-CM | POA: Diagnosis not present

## 2021-10-29 DIAGNOSIS — Z1329 Encounter for screening for other suspected endocrine disorder: Secondary | ICD-10-CM | POA: Diagnosis not present

## 2021-10-30 LAB — HEPATIC FUNCTION PANEL
ALT: 26 IU/L (ref 0–32)
AST: 24 IU/L (ref 0–40)
Albumin: 4.5 g/dL (ref 3.7–4.7)
Alkaline Phosphatase: 68 IU/L (ref 44–121)
Bilirubin Total: 1 mg/dL (ref 0.0–1.2)
Bilirubin, Direct: 0.28 mg/dL (ref 0.00–0.40)
Total Protein: 6.7 g/dL (ref 6.0–8.5)

## 2021-10-30 LAB — CBC WITH DIFFERENTIAL/PLATELET
Basophils Absolute: 0 10*3/uL (ref 0.0–0.2)
Basos: 1 %
EOS (ABSOLUTE): 0 10*3/uL (ref 0.0–0.4)
Eos: 1 %
Hematocrit: 40.2 % (ref 34.0–46.6)
Hemoglobin: 14 g/dL (ref 11.1–15.9)
Immature Grans (Abs): 0 10*3/uL (ref 0.0–0.1)
Immature Granulocytes: 0 %
Lymphocytes Absolute: 1.2 10*3/uL (ref 0.7–3.1)
Lymphs: 33 %
MCH: 32.8 pg (ref 26.6–33.0)
MCHC: 34.8 g/dL (ref 31.5–35.7)
MCV: 94 fL (ref 79–97)
Monocytes Absolute: 0.4 10*3/uL (ref 0.1–0.9)
Monocytes: 10 %
Neutrophils Absolute: 2 10*3/uL (ref 1.4–7.0)
Neutrophils: 55 %
Platelets: 229 10*3/uL (ref 150–450)
RBC: 4.27 x10E6/uL (ref 3.77–5.28)
RDW: 11.6 % — ABNORMAL LOW (ref 11.7–15.4)
WBC: 3.7 10*3/uL (ref 3.4–10.8)

## 2021-10-30 LAB — VITAMIN D 25 HYDROXY (VIT D DEFICIENCY, FRACTURES): Vit D, 25-Hydroxy: 68 ng/mL (ref 30.0–100.0)

## 2021-10-30 LAB — HEMOGLOBIN A1C
Est. average glucose Bld gHb Est-mCnc: 103 mg/dL
Hgb A1c MFr Bld: 5.2 % (ref 4.8–5.6)

## 2021-10-30 LAB — LIPID PANEL
Chol/HDL Ratio: 2.3 ratio (ref 0.0–4.4)
Cholesterol, Total: 189 mg/dL (ref 100–199)
HDL: 81 mg/dL (ref >39)
LDL Chol Calc (NIH): 97 mg/dL (ref 0–99)
Triglycerides: 59 mg/dL (ref 0–149)
VLDL Cholesterol Cal: 11 mg/dL (ref 5–40)

## 2021-10-30 LAB — BASIC METABOLIC PANEL
BUN/Creatinine Ratio: 28 (ref 12–28)
BUN: 22 mg/dL (ref 8–27)
CO2: 25 mmol/L (ref 20–29)
Calcium: 9.9 mg/dL (ref 8.7–10.3)
Chloride: 101 mmol/L (ref 96–106)
Creatinine, Ser: 0.79 mg/dL (ref 0.57–1.00)
Glucose: 89 mg/dL (ref 70–99)
Potassium: 4.1 mmol/L (ref 3.5–5.2)
Sodium: 144 mmol/L (ref 134–144)
eGFR: 78 mL/min/{1.73_m2} (ref >59)

## 2021-10-30 LAB — TSH: TSH: 2.1 u[IU]/mL (ref 0.450–4.500)

## 2021-11-01 ENCOUNTER — Telehealth: Payer: Self-pay

## 2021-11-01 NOTE — Telephone Encounter (Signed)
-----   Message from Jenean Lindau, MD sent at 11/01/2021 12:24 PM EDT ----- Labs are abnormal.  Please refer to lipid clinic.  Copy primary care Jenean Lindau, MD 11/01/2021 12:24 PM

## 2021-11-04 DIAGNOSIS — H0015 Chalazion left lower eyelid: Secondary | ICD-10-CM | POA: Diagnosis not present

## 2021-11-19 DIAGNOSIS — H5203 Hypermetropia, bilateral: Secondary | ICD-10-CM | POA: Diagnosis not present

## 2021-11-22 DIAGNOSIS — Z0100 Encounter for examination of eyes and vision without abnormal findings: Secondary | ICD-10-CM | POA: Diagnosis not present

## 2022-01-12 DIAGNOSIS — I781 Nevus, non-neoplastic: Secondary | ICD-10-CM | POA: Diagnosis not present

## 2022-01-12 DIAGNOSIS — L570 Actinic keratosis: Secondary | ICD-10-CM | POA: Diagnosis not present

## 2022-03-24 DIAGNOSIS — F4322 Adjustment disorder with anxiety: Secondary | ICD-10-CM | POA: Diagnosis not present

## 2022-04-05 ENCOUNTER — Telehealth: Payer: Self-pay | Admitting: Pharmacist

## 2022-04-05 NOTE — Telephone Encounter (Signed)
Called patient regarding follow up with PCP. Last OV was 04/23/2022. Patient declined to schedule follow up. States she usually only comes in if she has a problem.  Reminded her to get annual flu vaccine and that she is due Tetanus / pertussis.

## 2022-04-07 DIAGNOSIS — F4322 Adjustment disorder with anxiety: Secondary | ICD-10-CM | POA: Diagnosis not present

## 2022-04-13 DIAGNOSIS — F4322 Adjustment disorder with anxiety: Secondary | ICD-10-CM | POA: Diagnosis not present

## 2022-04-29 IMAGING — US US ABDOMEN LIMITED
1 series · 14 of 25 positions shown · non-contrast
Comparison: 05/01/2017

CLINICAL DATA: Right upper quadrant pain

EXAM:
ULTRASOUND ABDOMEN LIMITED RIGHT UPPER QUADRANT

[Series 1: us abdomen limited · 14 of 30 slices shown]
[im 1/30]
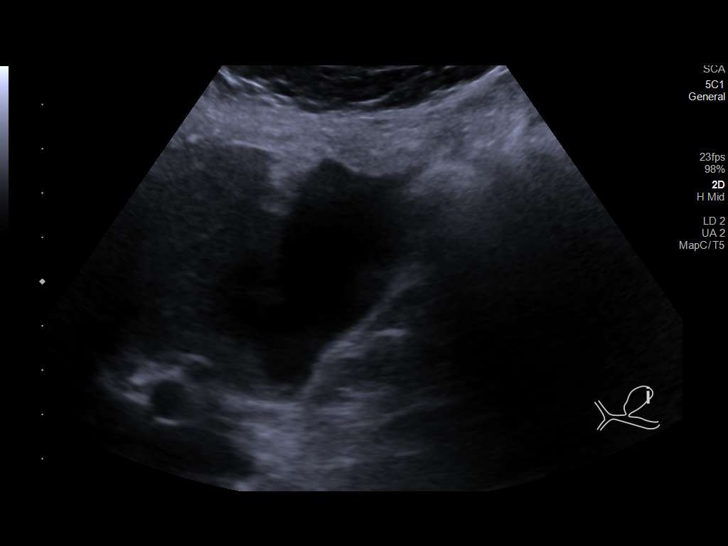
[im 3/30]
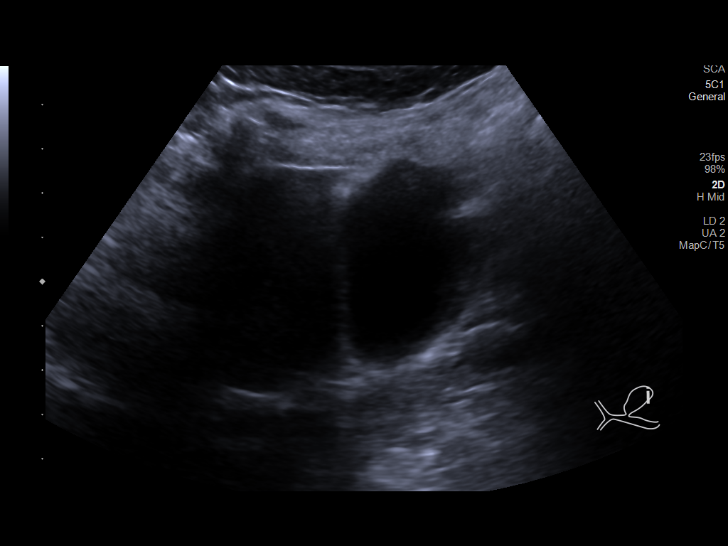
[im 5/30]
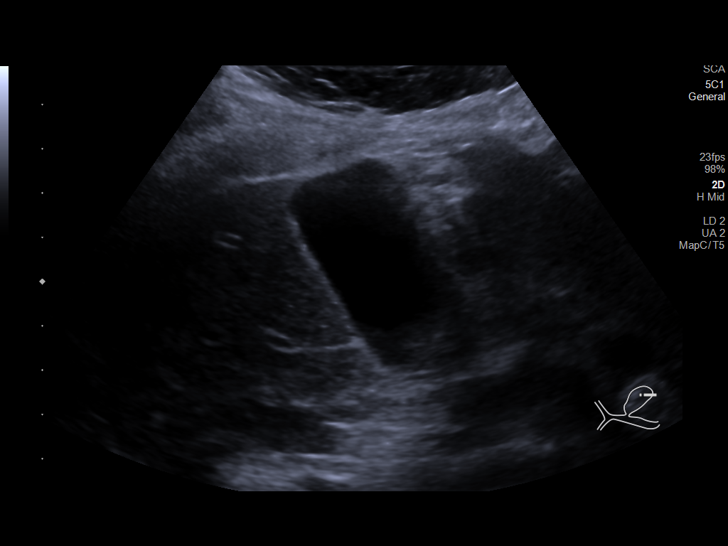
[im 8/30]
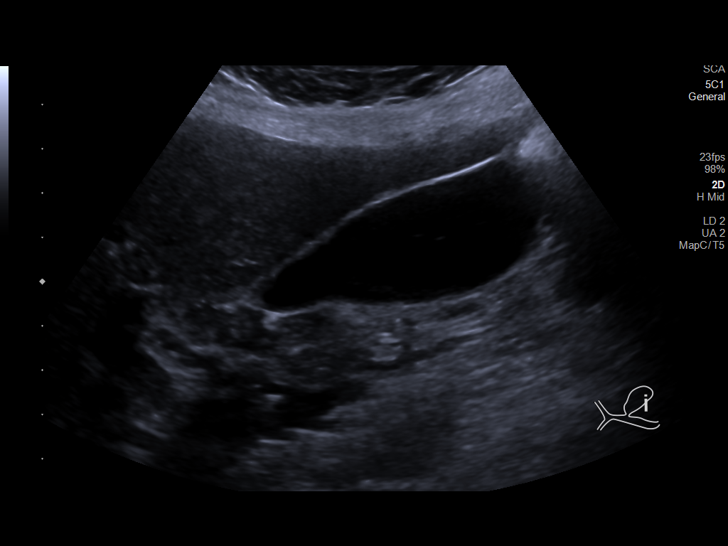
[im 10/30]
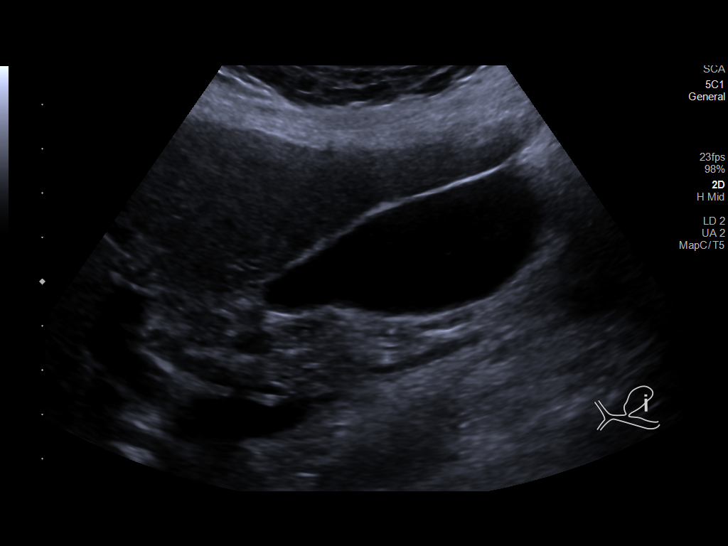
[im 11/30]
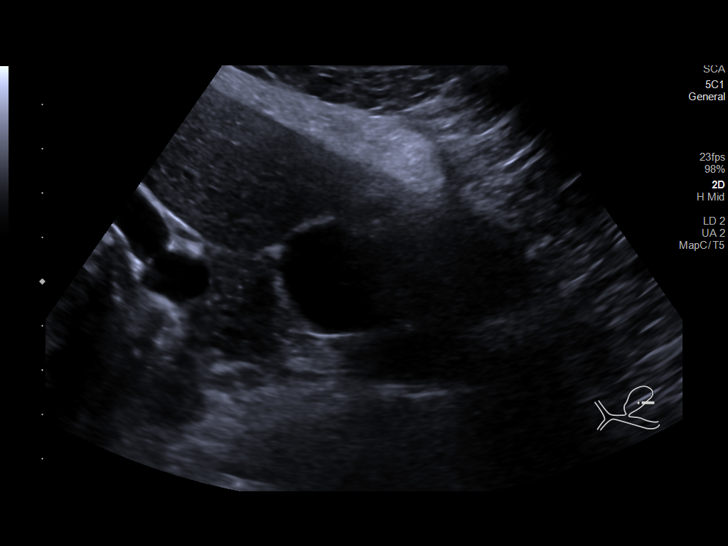
[im 14/30]
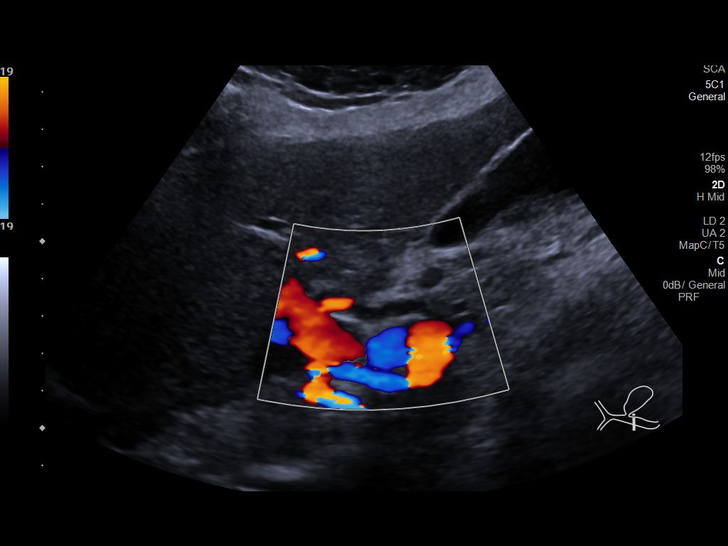
[im 16/30]
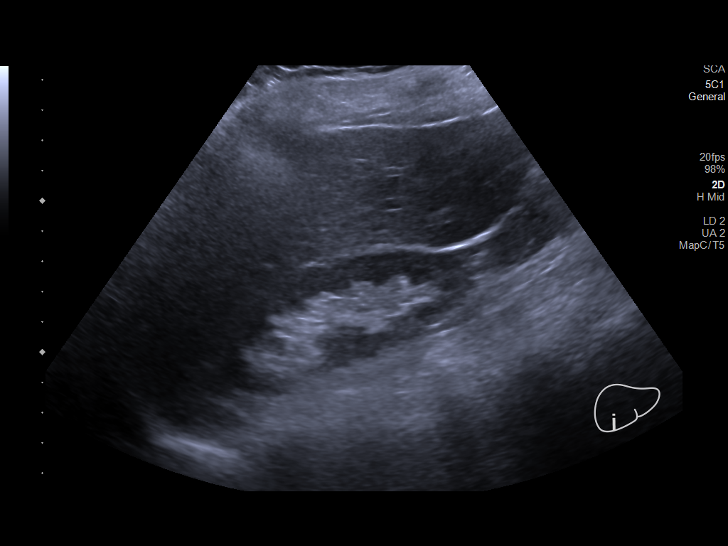
[im 19/30]
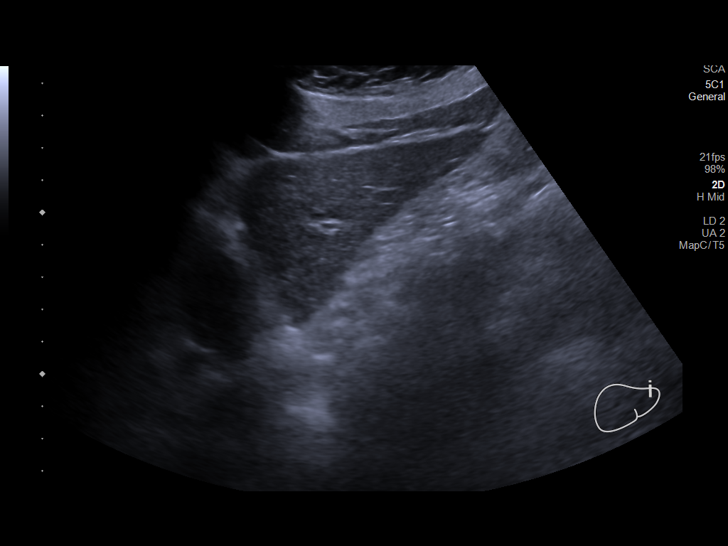
[im 20/30]
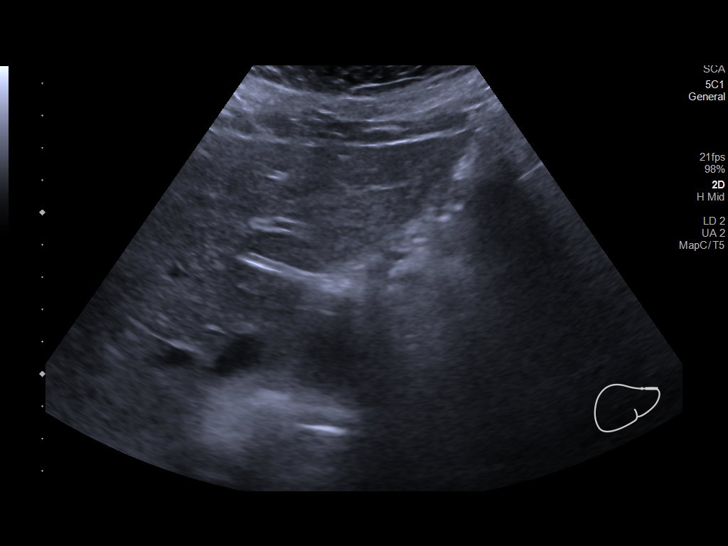
[im 22/30]
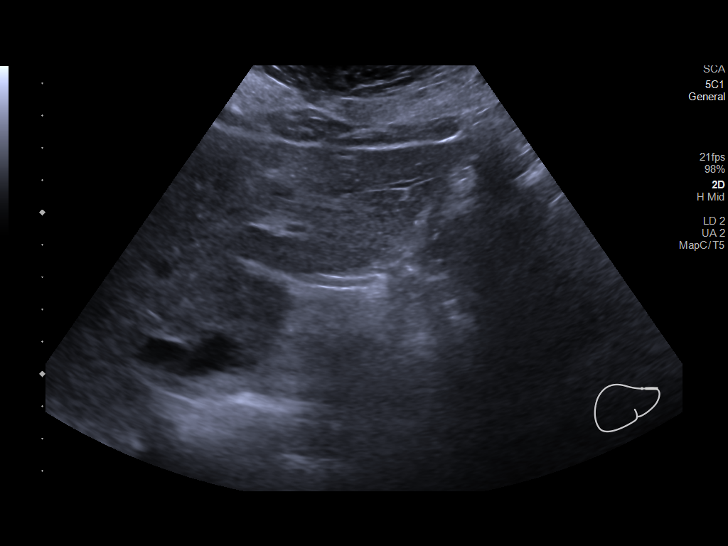
[im 25/30]
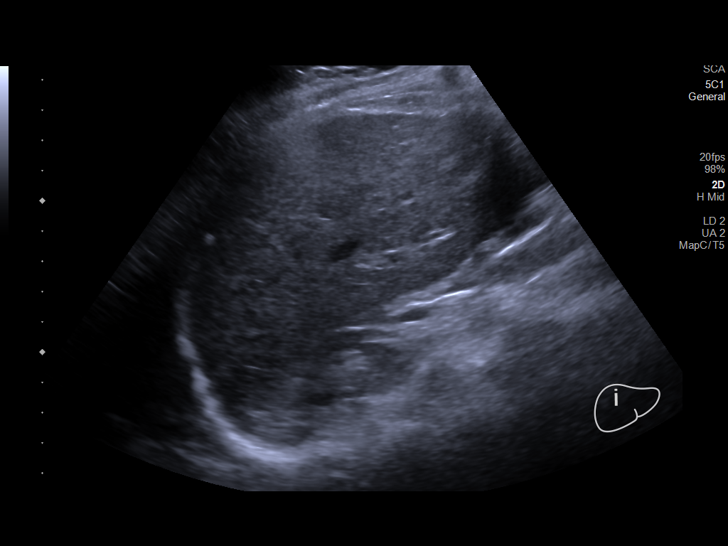
[im 27/30]
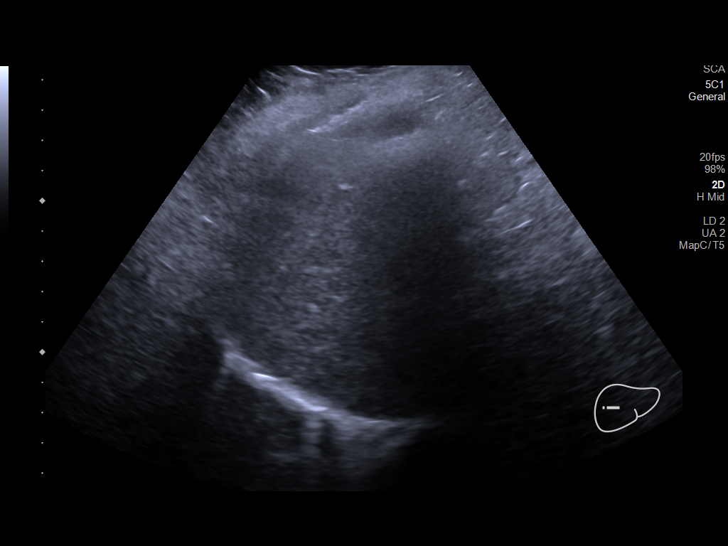
[im 30/30]
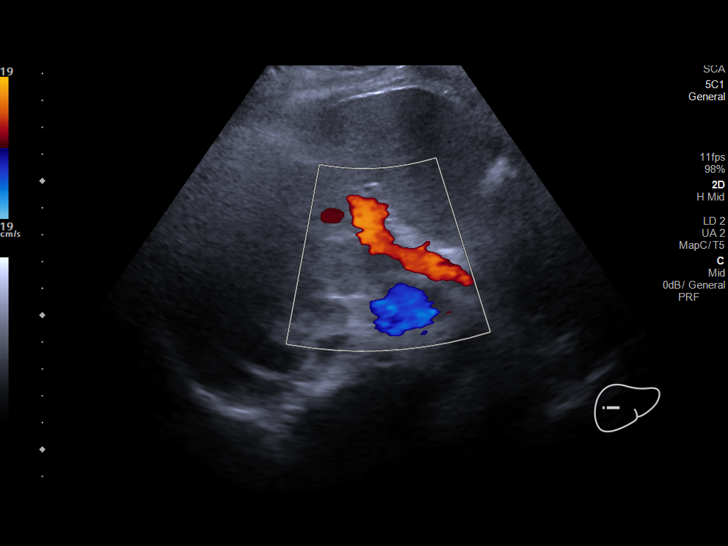

[14 of 25 positions shown; findings below may reference images not displayed]

FINDINGS: Gallbladder:

No gallstones or wall thickening visualized. No sonographic Murphy
sign noted by sonographer.

Common bile duct:

Diameter: 4 mm

Liver:

No focal lesion identified. Within normal limits in parenchymal
echogenicity. Portal vein is patent on color Doppler imaging with
normal direction of blood flow towards the liver.

Other: None.
IMPRESSION: No significant sonographic abnormality of the liver or gallbladder

## 2022-05-04 IMAGING — CT CT CARDIAC CORONARY ARTERY CALCIUM SCORE
2 series · 16 of 20 positions shown, 18 images · non-contrast
Comparison: None.
COMPARISON: None.

Addendum:
EXAM:
OVER-READ INTERPRETATION  CT CHEST

The following report is an over-read performed by radiologist Dr.
Flower Azeez [REDACTED] on 01/11/2021. This
over-read does not include interpretation of cardiac or coronary
anatomy or pathology. The coronary calcium score interpretation by
the cardiologist is attached.
CLINICAL DATA: Risk stratification
Coronary Calcium Score
TECHNIQUE: The patient was scanned on a Siemens Somatom 64 slice scanner. Axial
non-contrast 3 mm slices were carried out through the heart. The
data set was analyzed on a dedicated work station and scored using
the Agatson method.

[Series 2: casc 3.0 i36f 2 bestdiast 68 % · axial · 0.41mm/px · z∈[-220,-118]mm · 8 of 44 slices shown, 10 images]
[im 5/44  vessel]
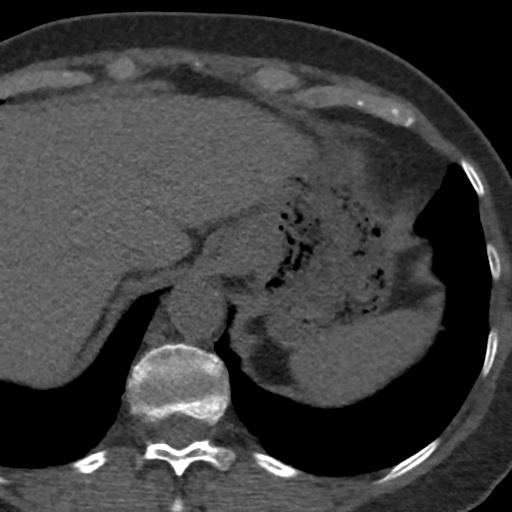
[im 5/44  lung]
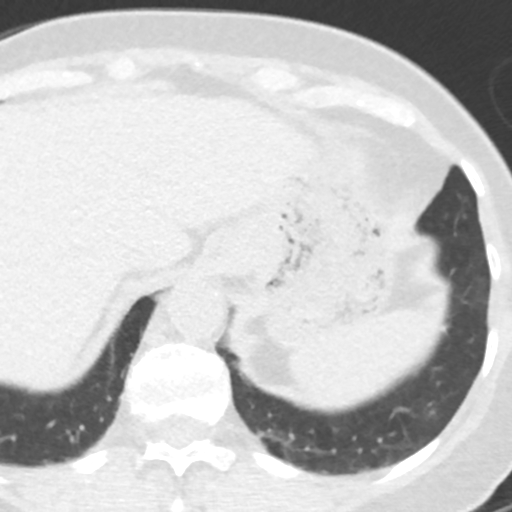
[im 10/44  vessel]
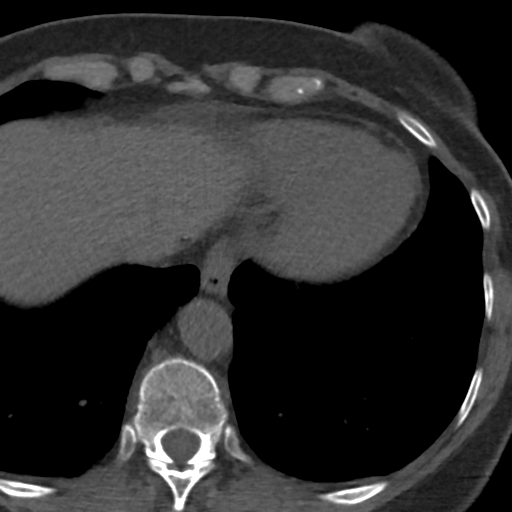
[im 15/44  vessel]
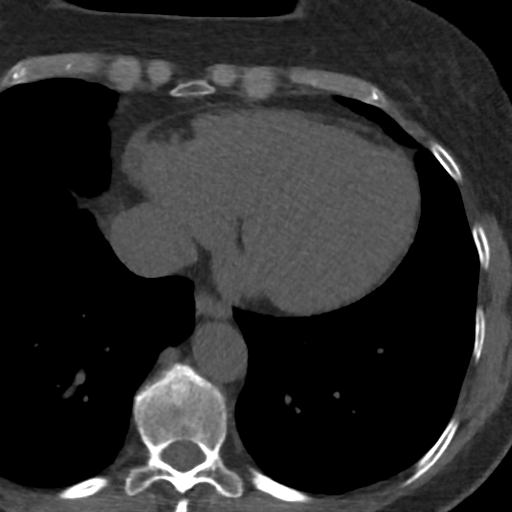
[im 20/44  vessel]
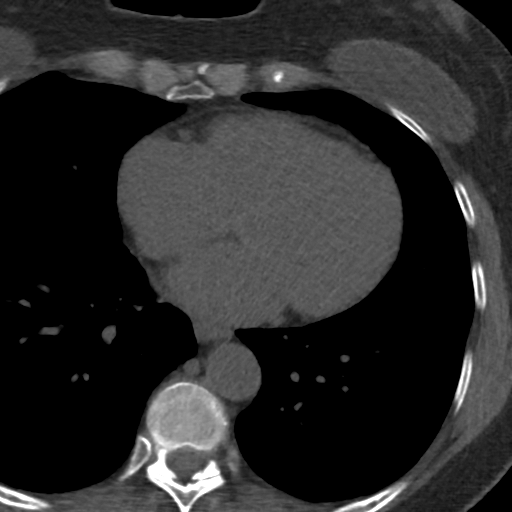
[im 24/44  vessel]
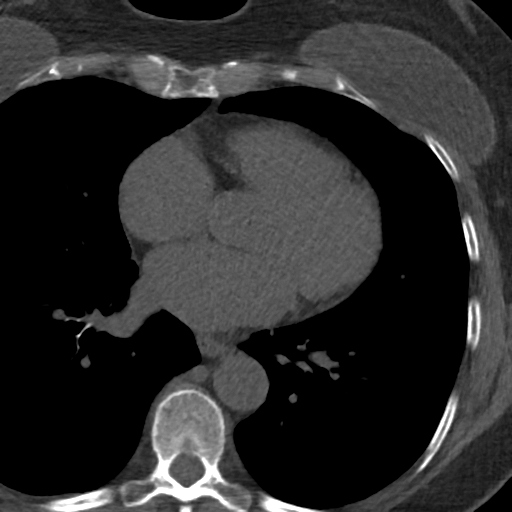
[im 24/44  lung]
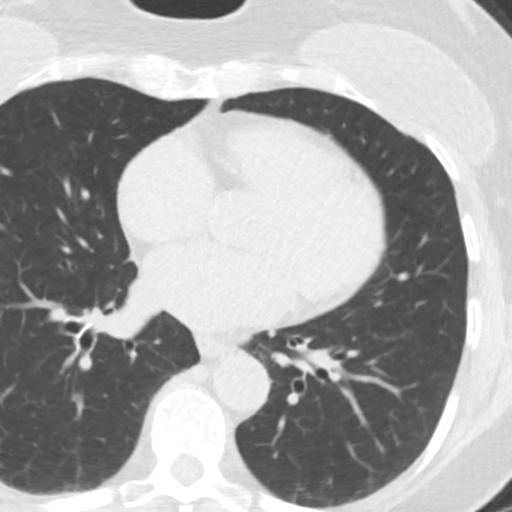
[im 29/44  vessel]
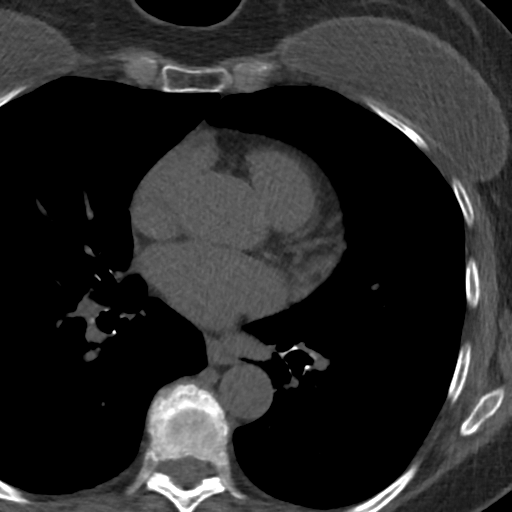
[im 34/44  vessel]
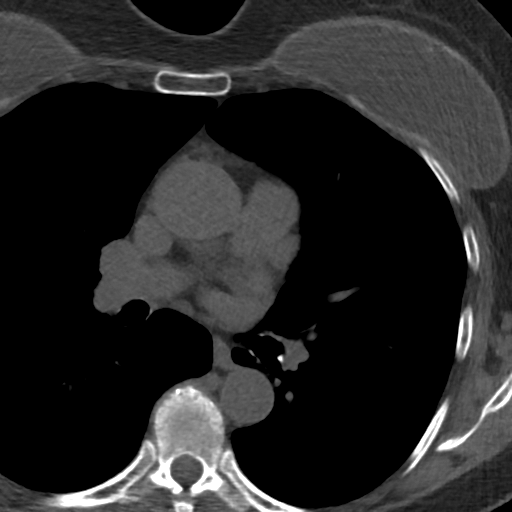
[im 39/44  vessel]
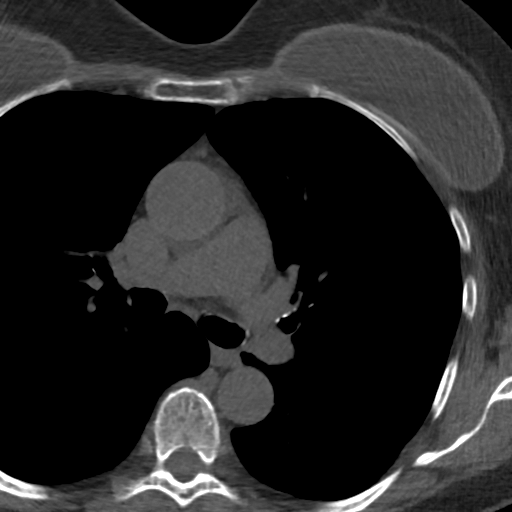

[Series 4: lung st 68 % · axial · 0.62mm/px · z∈[-220,-118]mm · 8 of 44 slices shown]
[im 5/44  lung]
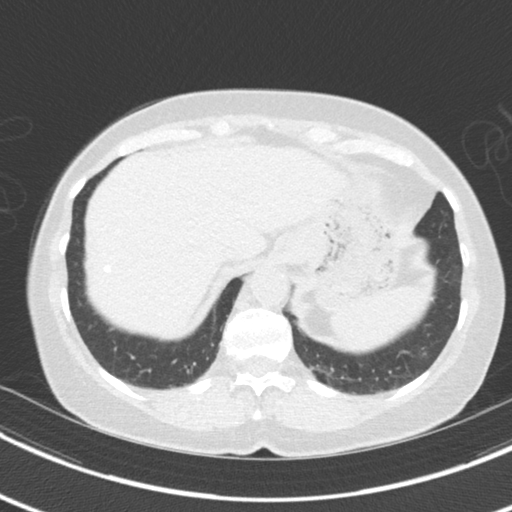
[im 10/44  lung]
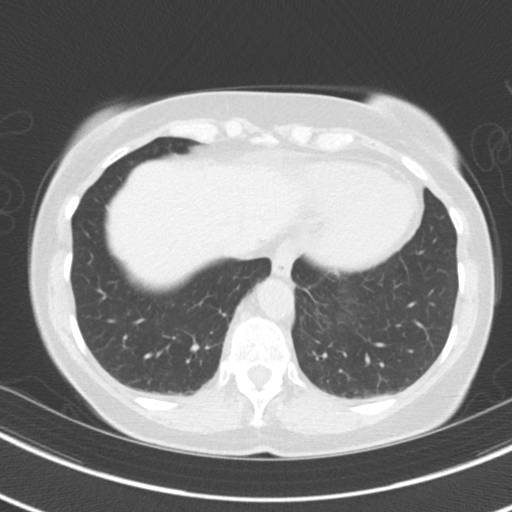
[im 15/44  lung]
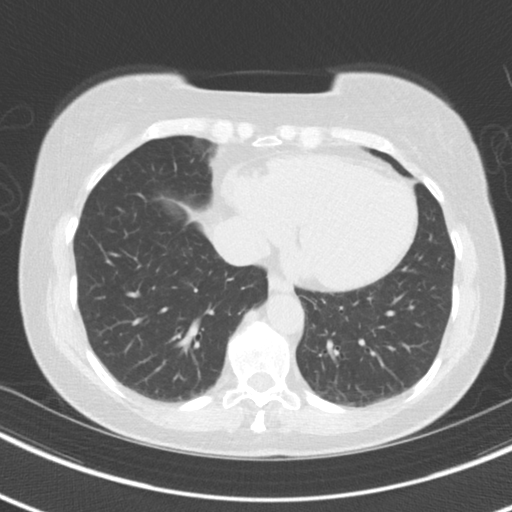
[im 20/44  lung]
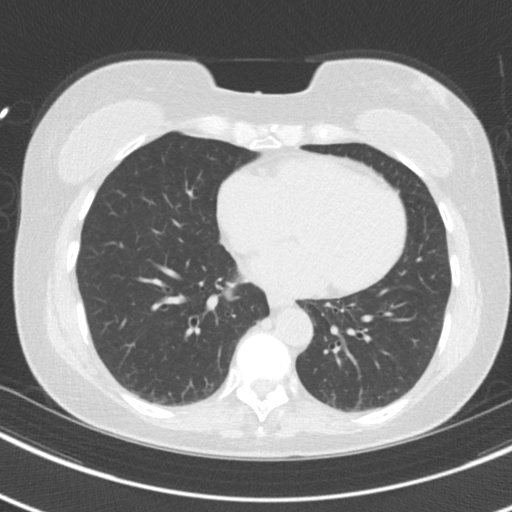
[im 24/44  lung]
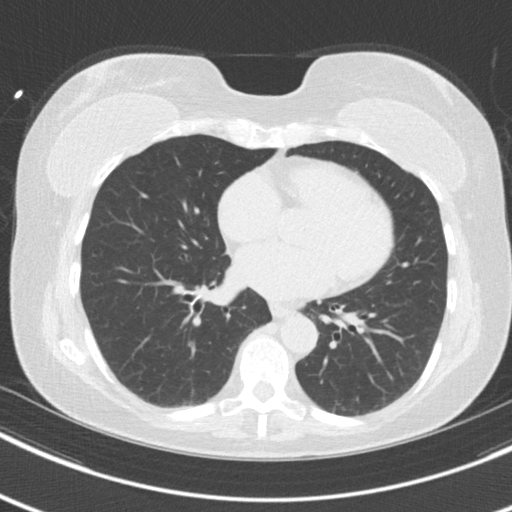
[im 29/44  lung]
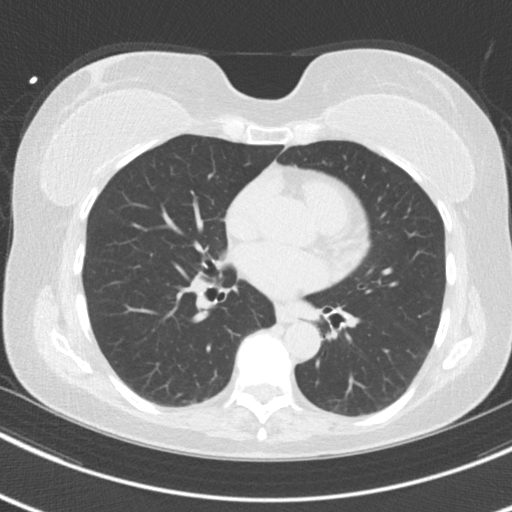
[im 34/44  lung]
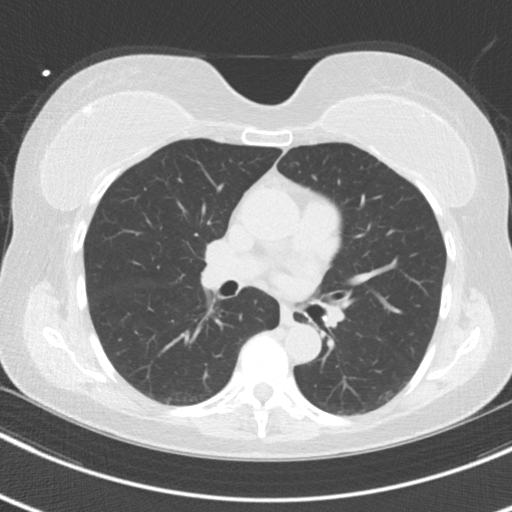
[im 39/44  lung]
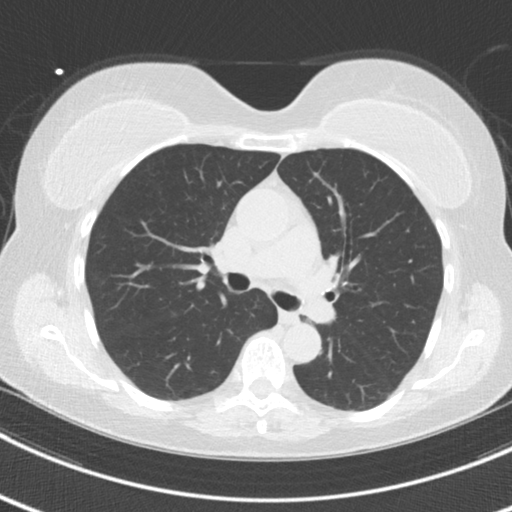

[16 of 20 positions shown; findings below may reference images not displayed]

FINDINGS: Aortic atherosclerosis. Within the visualized portions of the thorax
there are no suspicious appearing pulmonary nodules or masses, there
is no acute consolidative airspace disease, no pleural effusions, no
pneumothorax and no lymphadenopathy. Visualized portions of the
upper abdomen demonstrates a small calcified granuloma in the right
lobe of the liver. There are no aggressive appearing lytic or
blastic lesions noted in the visualized portions of the skeleton.
Bilateral breast implants are incidentally noted.
IMPRESSION: 1.  Aortic Atherosclerosis (QM97P-9N5.5).
FINDINGS: Non-cardiac: See separate report from [REDACTED].

Ascending aorta: Normal diameter 3.1 cm

Pericardium: Normal

Coronary arteries: Calcium isolated to the proximal LAD
IMPRESSION: Coronary calcium score of 33. This was 47 th percentile for age and
sex matched control.

Edwardas Vinamont

*** End of Addendum ***
EXAM:
OVER-READ INTERPRETATION  CT CHEST

The following report is an over-read performed by radiologist Dr.
Flower Azeez [REDACTED] on 01/11/2021. This
over-read does not include interpretation of cardiac or coronary
anatomy or pathology. The coronary calcium score interpretation by
the cardiologist is attached.
FINDINGS: Aortic atherosclerosis. Within the visualized portions of the thorax
there are no suspicious appearing pulmonary nodules or masses, there
is no acute consolidative airspace disease, no pleural effusions, no
pneumothorax and no lymphadenopathy. Visualized portions of the
upper abdomen demonstrates a small calcified granuloma in the right
lobe of the liver. There are no aggressive appearing lytic or
blastic lesions noted in the visualized portions of the skeleton.
Bilateral breast implants are incidentally noted.
IMPRESSION: 1.  Aortic Atherosclerosis (QM97P-9N5.5).

## 2022-05-12 NOTE — Telephone Encounter (Signed)
Correction - last OV was 04/23/2021

## 2022-05-17 DIAGNOSIS — F4322 Adjustment disorder with anxiety: Secondary | ICD-10-CM | POA: Diagnosis not present

## 2022-05-31 DIAGNOSIS — F4322 Adjustment disorder with anxiety: Secondary | ICD-10-CM | POA: Diagnosis not present

## 2022-06-13 ENCOUNTER — Other Ambulatory Visit: Payer: Self-pay | Admitting: Cardiology

## 2022-06-15 DIAGNOSIS — F4322 Adjustment disorder with anxiety: Secondary | ICD-10-CM | POA: Diagnosis not present

## 2022-06-17 ENCOUNTER — Telehealth: Payer: Self-pay

## 2022-06-17 NOTE — Telephone Encounter (Signed)
PA for Repatha 140 mg approved from 06/13/21 until 06/13/23.

## 2022-06-28 DIAGNOSIS — F4322 Adjustment disorder with anxiety: Secondary | ICD-10-CM | POA: Diagnosis not present

## 2022-06-30 ENCOUNTER — Other Ambulatory Visit (HOSPITAL_COMMUNITY): Payer: Self-pay

## 2022-07-12 DIAGNOSIS — F4322 Adjustment disorder with anxiety: Secondary | ICD-10-CM | POA: Diagnosis not present

## 2022-07-15 DIAGNOSIS — N905 Atrophy of vulva: Secondary | ICD-10-CM | POA: Diagnosis not present

## 2022-07-15 DIAGNOSIS — N952 Postmenopausal atrophic vaginitis: Secondary | ICD-10-CM | POA: Diagnosis not present

## 2022-07-15 DIAGNOSIS — R3915 Urgency of urination: Secondary | ICD-10-CM | POA: Diagnosis not present

## 2022-07-15 DIAGNOSIS — Z9889 Other specified postprocedural states: Secondary | ICD-10-CM | POA: Diagnosis not present

## 2022-07-15 DIAGNOSIS — Z01411 Encounter for gynecological examination (general) (routine) with abnormal findings: Secondary | ICD-10-CM | POA: Diagnosis not present

## 2022-07-19 ENCOUNTER — Other Ambulatory Visit: Payer: Self-pay

## 2022-07-25 NOTE — Progress Notes (Addendum)
[facility])                                            [name]                                        Statin Quality Measure Assessment    07/25/2022  Stephanie Lara June 20, 1945 [id number]  Per review of chart and payor information, this patient has been flagged for non-adherence to the following CMS Quality Measure:   []$  Statin Use in Persons with Diabetes  [x]$  Statin Use in Persons with Cardiovascular Disease  The 10-year ASCVD risk score (Arnett DK, et al., 2019) is: 12.2%   Values used to calculate the score:     Age: 77 years     Sex: Female     Is Non-Hispanic African American: No     Diabetic: No     Tobacco smoker: No     Systolic Blood Pressure: [id number] mmHg     Is BP treated: No     HDL Cholesterol: 81 mg/dL     Total Cholesterol: 189 mg/dL  This patient is failing SPC. Patient has a prior documented statin intolerance that was nooted in 2022 for myalgia due to statin therapy. Repatha on file. Next appointment with cardiologist on 07/27/2022 and PCP on 07/28/2022. If deemed clinically appropriate, please consider associating exclusion code at the next encounter.   Please consider ONE of the following recommendations:   Initiate high intensity statin Atorvastatin 35m once daily, #90, 3 refills   Rosuvastatin 247monce daily, #90, 3 refills    Initiate moderate intensity          statin with reduced frequency if prior          statin intolerance 1x weekly, #13, 3 refills   2x weekly, #26, 3 refills   3x weekly, #39, 3 refills   Code for past statin intolerance or other exclusions (required annually)  Drug Induced Myopathy G72.0   Myositis, unspecified M60.9   Rhabdomyolysis M62.82   Cirrhosis of liver K74.69   Biliary cirrhosis, unspecified K74.5   Abnormal blood glucose - for SUPD ONLY R73.09   Prediabetes - for SUPD ONLY  R73.03   Thank you for your time,  AsKristeen MissPhLoyaltonell: [phone]

## 2022-07-27 ENCOUNTER — Encounter: Payer: Self-pay | Admitting: Cardiology

## 2022-07-27 ENCOUNTER — Ambulatory Visit: Payer: Medicare HMO | Attending: Cardiology | Admitting: Cardiology

## 2022-07-27 VITALS — BP 144/76 | HR 76 | Ht 59.0 in | Wt 129.0 lb

## 2022-07-27 DIAGNOSIS — G720 Drug-induced myopathy: Secondary | ICD-10-CM | POA: Diagnosis not present

## 2022-07-27 DIAGNOSIS — I2584 Coronary atherosclerosis due to calcified coronary lesion: Secondary | ICD-10-CM

## 2022-07-27 DIAGNOSIS — T466X5A Adverse effect of antihyperlipidemic and antiarteriosclerotic drugs, initial encounter: Secondary | ICD-10-CM | POA: Diagnosis not present

## 2022-07-27 DIAGNOSIS — M791 Myalgia, unspecified site: Secondary | ICD-10-CM

## 2022-07-27 DIAGNOSIS — R5383 Other fatigue: Secondary | ICD-10-CM | POA: Diagnosis not present

## 2022-07-27 DIAGNOSIS — I2510 Atherosclerotic heart disease of native coronary artery without angina pectoris: Secondary | ICD-10-CM

## 2022-07-27 HISTORY — DX: Drug-induced myopathy: G72.0

## 2022-07-27 NOTE — Discharge Summary (Signed)
This visit was accompanied by Jerl Santos.

## 2022-07-27 NOTE — Patient Instructions (Signed)
Medication Instructions:  Your physician recommends that you continue on your current medications as directed. Please refer to the Current Medication list given to you today.  *If you need a refill on your cardiac medications before your next appointment, please call your pharmacy*   Lab Work: Your physician recommends that you return for lab work in: Tomorrow morning for a Bmp, CBC, TSH, Liver Function, Lipid Panel, Vitamin D, Hemoglobin A1C and B12  If you have labs (blood work) drawn today and your tests are completely normal, you will receive your results only by: [place] (if you have MyChart) OR A paper copy in the mail If you have any lab test that is abnormal or we need to change your treatment, we will call you to review the results.   Testing/Procedures: NONE   Follow-Up: At [facility], you and your health needs are our priority.  As part of our continuing mission to provide you with exceptional heart care, we have created designated Provider Care Teams.  These Care Teams include your primary Cardiologist (physician) and Advanced Practice Providers (APPs -  Physician Assistants and Nurse Practitioners) who all work together to provide you with the care you need, when you need it.  We recommend signing up for the patient portal called "MyChart".  Sign up information is provided on this After Visit Summary.  MyChart is used to connect with patients for Virtual Visits (Telemedicine).  Patients are able to view lab/test results, encounter notes, upcoming appointments, etc.  Non-urgent messages can be sent to your provider as well.   To learn more about what you can do with MyChart, go to [email or web address].    Your next appointment:   12 month(s)  Provider:   Jyl Heinz, MD    Other Instructions

## 2022-07-27 NOTE — Progress Notes (Signed)
Cardiology Office Note:    Date:  07/27/2022   ID:  Stephanie Lara, DOB 09-11-1945, MRN [id number]  PCP:  Carollee Herter, Alferd Apa, DO  Cardiologist:  Jenean Lindau, MD   Referring MD: Carollee Herter, Alferd Apa, *    ASSESSMENT:    1. Coronary artery calcification   2. Myalgia due to statin    PLAN:    In order of problems listed above:  Elevated calcium score: Secondary prevention stressed with the patient.  Importance of compliance with diet medication stressed and she vocalized understanding.  She was advised to walk at least half an hour a day 5 days a week and she promises to do so. Mixed dyslipidemia: On PCSK9 medications.  Should be back in the morning for complete blood work including lipids.  Will do a hemoglobin A1c also.  Because of her history of fatigue I will do vitamin D and vitamin B12 levels.  She has history of vitamin D deficiency.  This will be addressed by her primary care who she is seeing tomorrow evening. Patient will be seen in follow-up appointment in 6 months or earlier if the patient has any concerns    Medication Adjustments/Labs and Tests Ordered: Current medicines are reviewed at length with the patient today.  Concerns regarding medicines are outlined above.  No orders of the defined types were placed in this encounter.  No orders of the defined types were placed in this encounter.    No chief complaint on file.    History of Present Illness:    Stephanie Lara is a 77 y.o. female.  Patient has past medical history of coronary artery disease and denies any problems at this time and takes care of activities of daily living.  She has history of mixed dyslipidemia and statin intolerant.  She is on PCSK9 medications.  She denies any chest pain orthopnea or PND.  She gives history of overall generalized fatigue and not had any specific history to this.  No dizziness no dyspnea on exertion.  At the time of my evaluation, the patient is alert awake oriented  and in no distress.  Past Medical History:  Diagnosis Date   Abnormal biliary HIDA scan 02/25/2021   Chest pain 01/06/2021   Chicken pox    Contact dermatitis due to plants, except food 10/20/2008   Overview:  Dermatitis Due To Contact With Plants   Coronary artery calcification 03/01/2021   Dizzy 06/21/2018   Fatigue 11/11/2016   Globus pharyngeus 04/18/2013   Hot flashes 04/26/2019   Hyperlipidemia    Insomnia 03/03/2015   Lipoma of neck 11/02/2011   Mitral regurgitation 04/07/2021   Myalgia due to statin 03/04/2021   Other headache syndrome 06/21/2018   Preoperative cardiovascular examination 03/01/2021   Preventative health care 01/09/2017   Primary insomnia 12/22/2015   Rheumatoid arthritis ([place])    RUQ pain 01/06/2021   Upper respiratory tract infection 05/24/2018   Urinary incontinence 03/03/2015    Past Surgical History:  Procedure Laterality Date   ABDOMINOPLASTY     BREAST BIOPSY     CHOLECYSTECTOMY  03/08/2021   LIPOMA EXCISION     neck   PLACEMENT OF BREAST IMPLANTS     RHINOPLASTY     SEPTOPLASTY      Current Medications: Current Meds  Medication Sig   Ascorbic Acid (VITAMIN C) 1000 MG tablet Take 1,000 mg by mouth daily.   aspirin EC 81 MG tablet Take 1 tablet (81 mg total) by mouth daily.  Swallow whole.   b complex vitamins capsule Take 1 capsule by mouth daily.   CALCIUM PO Take 1 tablet by mouth daily.   Cholecalciferol (D3 PO) Take 1 tablet by mouth daily.   Evolocumab (REPATHA SURECLICK) [id number] MG/ML SOAJ INJECT 140 MG INTO THE SKIN EVERY 14 (FOURTEEN) DAYS.   LATISSE 0.03 % ophthalmic solution Place 1 drop into both eyes daily.   nitroGLYCERIN (NITROSTAT) 0.4 MG SL tablet Place 0.4 mg under the tongue every 5 (five) minutes as needed for chest pain.   Omega-3 Fatty Acids (FISH OIL PO) Take 1 tablet by mouth daily.   pantoprazole (PROTONIX) 40 MG tablet Take 40 mg by mouth daily.   traZODone (DESYREL) 50 MG tablet Take 50 mg by mouth at bedtime.   TURMERIC PO Take  1 tablet by mouth daily.   VITAMIN E PO Take 1 tablet by mouth daily.     Allergies:   Definity [perflutren lipid microsphere], Lipitor [atorvastatin], Pitavastatin, and Pravastatin   Social History   Socioeconomic History   Marital status: Married    Spouse name: Not on file   Number of children: Not on file   Years of education: Not on file   Highest education level: Not on file  Occupational History   Occupation: retired Teacher, 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[employer]  Tobacco Use   Smoking status: Never   Smokeless tobacco: Never  Vaping Use   Vaping Use: Never used  Substance and Sexual Activity   Alcohol use: Yes    Alcohol/week: 0.0 standard drinks of alcohol    Comment: Occ   Drug use: No   Sexual activity: Not on file  Other Topics Concern   Not on file  Social History Narrative   Not on file   Social Determinants of Health   Financial Resource Strain: Not on file  Food Insecurity: Not on file  Transportation Needs: Not on file  Physical Activity: Not on file  Stress: Not on file  Social Connections: Not on file     Family History: The patient's family history includes Deep vein thrombosis in her father; Diabetes in her paternal grandmother; Gallbladder disease in her father; Heart attack in her father; Heart disease in her father; Hypertension in her brother and mother; Sudden death in her father.  ROS:   Please see the history of present illness.    All other systems reviewed and are negative.  EKGs/Labs/Other Studies Reviewed:    The following studies were reviewed today: EKG reveals sinus rhythm and nonspecific ST-T changes   Recent Labs: 10/29/2021: ALT 26; BUN 22; Creatinine, Ser 0.79; Hemoglobin 14.0; Platelets 229; Potassium 4.1; Sodium 144; TSH 2.100  Recent Lipid Panel    Component Value Date/Time   CHOL 189 10/29/2021 0934   TRIG 59 10/29/2021 0934   HDL 81 10/29/2021 0934   CHOLHDL 2.3 10/29/2021 0934   CHOLHDL 4.1 04/06/2020 1350   VLDL 10.6 04/04/2019  1339   LDLCALC 97 10/29/2021 0934   LDLCALC 190 (H) 04/06/2020 1350    Physical Exam:    VS:  BP (!) 144/76   Pulse 76   Ht 4' 11"$  (1.499 m)   Wt 129 lb 0.6 oz (58.5 kg)   SpO2 97%   BMI 26.06 kg/m     Wt Readings from Last 3 Encounters:  07/27/22 129 lb 0.6 oz (58.5 kg)  10/26/21 125 lb 1.9 oz (56.8 kg)  09/27/21 122 lb (55.3 kg)     GEN: Patient is in no acute distress  HEENT: Normal NECK: No JVD; No carotid bruits LYMPHATICS: No lymphadenopathy CARDIAC: Hear sounds regular, 2/6 systolic murmur at the apex. RESPIRATORY:  Clear to auscultation without rales, wheezing or rhonchi  ABDOMEN: Soft, non-tender, non-distended MUSCULOSKELETAL:  No edema; No deformity  SKIN: Warm and dry NEUROLOGIC:  Alert and oriented x 3 PSYCHIATRIC:  Normal affect   Signed, Jenean Lindau, MD  07/27/2022 1:18 PM    [facility]

## 2022-07-28 ENCOUNTER — Ambulatory Visit (INDEPENDENT_AMBULATORY_CARE_PROVIDER_SITE_OTHER): Payer: Medicare HMO | Admitting: Family Medicine

## 2022-07-28 ENCOUNTER — Encounter: Payer: Self-pay | Admitting: Family Medicine

## 2022-07-28 VITALS — BP 136/78 | HR 70 | Temp 98.0°F | Resp 16 | Ht 59.0 in | Wt 131.2 lb

## 2022-07-28 DIAGNOSIS — M791 Myalgia, unspecified site: Secondary | ICD-10-CM | POA: Diagnosis not present

## 2022-07-28 DIAGNOSIS — R82998 Other abnormal findings in urine: Secondary | ICD-10-CM | POA: Insufficient documentation

## 2022-07-28 DIAGNOSIS — T466X5A Adverse effect of antihyperlipidemic and antiarteriosclerotic drugs, initial encounter: Secondary | ICD-10-CM

## 2022-07-28 DIAGNOSIS — F321 Major depressive disorder, single episode, moderate: Secondary | ICD-10-CM | POA: Insufficient documentation

## 2022-07-28 DIAGNOSIS — M549 Dorsalgia, unspecified: Secondary | ICD-10-CM

## 2022-07-28 DIAGNOSIS — E782 Mixed hyperlipidemia: Secondary | ICD-10-CM | POA: Diagnosis not present

## 2022-07-28 DIAGNOSIS — I2584 Coronary atherosclerosis due to calcified coronary lesion: Secondary | ICD-10-CM | POA: Diagnosis not present

## 2022-07-28 DIAGNOSIS — R5383 Other fatigue: Secondary | ICD-10-CM | POA: Diagnosis not present

## 2022-07-28 DIAGNOSIS — I2510 Atherosclerotic heart disease of native coronary artery without angina pectoris: Secondary | ICD-10-CM | POA: Diagnosis not present

## 2022-07-28 DIAGNOSIS — F4322 Adjustment disorder with anxiety: Secondary | ICD-10-CM | POA: Diagnosis not present

## 2022-07-28 HISTORY — DX: Other abnormal findings in urine: R82.998

## 2022-07-28 HISTORY — DX: Major depressive disorder, single episode, moderate: F32.1

## 2022-07-28 HISTORY — DX: Dorsalgia, unspecified: M54.9

## 2022-07-28 LAB — POC URINALSYSI DIPSTICK (AUTOMATED)
Bilirubin, UA: NEGATIVE
Blood, UA: NEGATIVE
Glucose, UA: NEGATIVE
Ketones, UA: NEGATIVE
Nitrite, UA: NEGATIVE
Protein, UA: NEGATIVE
Spec Grav, UA: 1.015 (ref 1.010–1.025)
Urobilinogen, UA: 0.2 E.U./dL
pH, UA: 7 (ref 5.0–8.0)

## 2022-07-28 MED ORDER — FLUOXETINE HCL 10 MG PO CAPS: 10.0000 mg | ORAL_CAPSULE | Freq: Every day | ORAL | 3 refills | Status: DC

## 2022-07-28 NOTE — Assessment & Plan Note (Signed)
Start prozac 10 mg daily  F/u 1 month or sooner as needed

## 2022-07-28 NOTE — Progress Notes (Addendum)
Subjective:   By signing my name below, I, Shehryar Baig, attest that this documentation has been prepared under the direction and in the presence of Ann Held, DO. 07/28/2022   Patient ID: Stephanie Lara, female    DOB: [birth year], 77 y.o.   MRN: [id number]  Chief Complaint  Patient presents with   Back Pain    Back Pain    Back Pain Pertinent negatives include no abdominal pain, chest pain, dysuria, fever or headaches.   Patient is in today for a office visit.   She complains of sore pain in her middle back around her ribs. Her pains is better in the morning and worsens through out the day. She denies having any cough or prior injury to her back. Her pain does not radiate anywhere else. She had discomfort and difficulty laying on hard flooring and getting up during yoga. Her pain does not improved with exercise and stretching.  She reports feeling more fatigued since January 2024. She has seen a cardiologist and received lab work and found no new issues. She thinks she may be depressed. She has difficulty motivating. She has an upcomming trip to [place] but cannot find excitement for it when she typically would. She has taken Zoloft  in the past but stopped due to not feeling any emotions while taking it. She denies having any extra anxiety.    Past Medical History:  Diagnosis Date   Abnormal biliary HIDA scan 02/25/2021   Chest pain 01/06/2021   Chicken pox    Contact dermatitis due to plants, except food 10/20/2008   Overview:  Dermatitis Due To Contact With Plants   Coronary artery calcification 03/01/2021   Dizzy 06/21/2018   Fatigue 11/11/2016   Globus pharyngeus 04/18/2013   Hot flashes 04/26/2019   Hyperlipidemia    Insomnia 03/03/2015   Lipoma of neck 11/02/2011   Mitral regurgitation 04/07/2021   Myalgia due to statin 03/04/2021   Other headache syndrome 06/21/2018   Preoperative cardiovascular examination 03/01/2021   Preventative health care 01/09/2017   Primary  insomnia 12/22/2015   Rheumatoid arthritis ([place])    RUQ pain 01/06/2021   Upper respiratory tract infection 05/24/2018   Urinary incontinence 03/03/2015    Past Surgical History:  Procedure Laterality Date   ABDOMINOPLASTY     BREAST BIOPSY     CHOLECYSTECTOMY  03/08/2021   LIPOMA EXCISION     neck   PLACEMENT OF BREAST IMPLANTS     RHINOPLASTY     SEPTOPLASTY      Family History  Problem Relation Age of Onset   Hypertension Mother    Heart disease Father    Sudden death Father    Heart attack Father    Gallbladder disease Father    Deep vein thrombosis Father    Diabetes Paternal Grandmother    Hypertension Brother     Social History   Socioeconomic History   Marital status: Married    Spouse name: Not on file   Number of children: Not on file   Years of education: Not on file   Highest education level: Not on file  Occupational History   Occupation: retired Teacher, 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[employer]  Tobacco Use   Smoking status: Never   Smokeless tobacco: Never  Vaping Use   Vaping Use: Never used  Substance and Sexual Activity   Alcohol use: Yes    Alcohol/week: 0.0 standard drinks of alcohol    Comment: Occ   Drug use: No  Sexual activity: Not on file  Other Topics Concern   Not on file  Social History Narrative   Not on file   Social Determinants of Health   Financial Resource Strain: Not on file  Food Insecurity: Not on file  Transportation Needs: Not on file  Physical Activity: Not on file  Stress: Not on file  Social Connections: Not on file  Intimate Partner Violence: Not on file    Outpatient Medications Prior to Visit  Medication Sig Dispense Refill   Ascorbic Acid (VITAMIN C) 1000 MG tablet Take 1,000 mg by mouth daily.     aspirin EC 81 MG tablet Take 1 tablet (81 mg total) by mouth daily. Swallow whole. 90 tablet 3   b complex vitamins capsule Take 1 capsule by mouth daily.     CALCIUM PO Take 1 tablet by mouth daily.     Cholecalciferol (D3 PO)  Take 1 tablet by mouth daily.     Evolocumab (REPATHA SURECLICK) [id number] MG/ML SOAJ INJECT 140 MG INTO THE SKIN EVERY 14 (FOURTEEN) DAYS. 6 mL 2   LATISSE 0.03 % ophthalmic solution Place 1 drop into both eyes daily.     nitroGLYCERIN (NITROSTAT) 0.4 MG SL tablet Place 0.4 mg under the tongue every 5 (five) minutes as needed for chest pain.     Omega-3 Fatty Acids (FISH OIL PO) Take 1 tablet by mouth daily.     pantoprazole (PROTONIX) 40 MG tablet Take 40 mg by mouth daily.     TURMERIC PO Take 1 tablet by mouth daily.     VITAMIN E PO Take 1 tablet by mouth daily.     traZODone (DESYREL) 50 MG tablet Take 50 mg by mouth at bedtime.     No facility-administered medications prior to visit.    Allergies  Allergen Reactions   Definity [Perflutren Lipid Microsphere] Other (See Comments)    Severe abdominal Pain   Lipitor [Atorvastatin]     myalgias   Pitavastatin     Myalgias    Pravastatin     myalgias    Review of Systems  Constitutional:  Positive for malaise/fatigue. Negative for fever.  HENT:  Negative for congestion.   Eyes:  Negative for blurred vision.  Respiratory:  Negative for cough and shortness of breath.   Cardiovascular:  Negative for chest pain, palpitations and leg swelling.  Gastrointestinal:  Negative for abdominal pain, blood in stool and nausea.  Genitourinary:  Negative for dysuria and frequency.  Musculoskeletal:  Positive for back pain (middle back). Negative for falls.  Skin:  Negative for rash.  Neurological:  Negative for dizziness, loss of consciousness and headaches.  Endo/Heme/Allergies:  Negative for environmental allergies.  Psychiatric/Behavioral:  Positive for depression. Negative for hallucinations, memory loss, substance abuse and suicidal ideas. The patient is not nervous/anxious.        Objective:    Physical Exam Vitals and nursing note reviewed.  Constitutional:      General: She is not in acute distress.    Appearance: Normal  appearance. She is not ill-appearing.  HENT:     Head: Normocephalic and atraumatic.     Right Ear: External ear normal.     Left Ear: External ear normal.  Eyes:     Extraocular Movements: Extraocular movements intact.     Pupils: Pupils are equal, round, and reactive to light.  Cardiovascular:     Rate and Rhythm: Normal rate and regular rhythm.     Heart sounds: Normal heart sounds.  No murmur heard.    No gallop.  Pulmonary:     Effort: Pulmonary effort is normal. No respiratory distress.     Breath sounds: Normal breath sounds. No wheezing or rales.  Musculoskeletal:     Thoracic back: No tenderness (with palpation or flexion).     Comments: Lower thoracic tenderness in middle back during back extension  Skin:    General: Skin is warm and dry.  Neurological:     Mental Status: She is alert and oriented to person, place, and time.  Psychiatric:        Attention and Perception: Attention and perception normal.        Mood and Affect: Mood is depressed. Mood is not anxious or elated. Affect is not labile, blunt, flat, angry, tearful or inappropriate.        Speech: Speech normal.        Behavior: Behavior normal. Behavior is cooperative.        Thought Content: Thought content normal.        Cognition and Memory: Cognition normal.        Judgment: Judgment normal.     BP 136/78 (BP Location: Right Arm, Patient Position: Sitting, Cuff Size: Normal)   Pulse 70   Temp 98 F (36.7 C) (Oral)   Resp 16   Ht 4' 11"$  (1.499 m)   Wt 131 lb 3.2 oz (59.5 kg)   SpO2 96%   BMI 26.50 kg/m  Wt Readings from Last 3 Encounters:  07/28/22 131 lb 3.2 oz (59.5 kg)  07/27/22 129 lb 0.6 oz (58.5 kg)  10/26/21 125 lb 1.9 oz (56.8 kg)       Assessment & Plan:  Depression, major, single episode, moderate (HCC) Assessment & Plan: Start prozac 10 mg daily  F/u 1 month or sooner as needed   Orders: -     FLUoxetine HCl; Take 1 capsule (10 mg total) by mouth daily.  Dispense: 90 capsule;  Refill: 3  Myalgia -     POCT Urinalysis Dipstick (Automated) -     ANA -     CK -     Rheumatoid factor  Mid back pain Assessment & Plan: Check urine and xray   Orders: -     POCT Urinalysis Dipstick (Automated) -     DG Thoracic Spine 2 View; Future  Leukocytes in urine -     Urine Culture    I, Ann Held, DO, personally preformed the services described in this documentation.  All medical record entries made by the scribe were at my direction and in my presence.  I have reviewed the chart and discharge instructions (if applicable) and agree that the record reflects my personal performance and is accurate and complete. 07/28/2022   I,Shehryar Baig,acting as a scribe for Ann Held, DO.,have documented all relevant documentation on the behalf of Ann Held, DO,as directed by  Ann Held, DO while in the presence of Ann Held, DO.   Ann Held, DO

## 2022-07-28 NOTE — Assessment & Plan Note (Signed)
Check urine and xray

## 2022-07-28 NOTE — Patient Instructions (Signed)
Muscle Pain, Adult Muscle pain, also called myalgia, is a condition in which a person has pain in one or more muscles in the body. Muscle pain may be mild, moderate, or severe. It may feel sharp, achy, or burning. In most cases, the pain lasts only a short time and goes away without treatment. Muscle pain can result from using muscles in a new or different way or after a period of inactivity. It is normal to feel some muscle pain after starting an exercise program. Muscles that have not been used often will be sore at first. What are the causes? This condition is caused by using muscles in a new or different way after a period of inactivity. Other causes may include: Overuse or muscle strain, especially if you are not in shape. This is the most common cause of muscle pain. Injury or bruising. Infectious diseases, including diseases caused by viruses, such as the flu (influenza). Fibromyalgia.This is a long-term, or chronic, condition that causes muscle tenderness, tiredness (fatigue), and headache. Autoimmune or rheumatologic diseases. These are conditions, such as lupus, in which the body's defense system (immunesystem) attacks areas in the body. Certain medicines, including ACE inhibitors and statins. What are the signs or symptoms? The main symptom of this condition is sore or painful muscles, including during activity and when stretching. You may also have slight swelling. How is this diagnosed? This condition is diagnosed with a physical exam. Your health care provider will ask questions about your pain and when it began. If you have not had muscle pain for very long, your health care provider may want to wait before doing much testing. If your muscle pain has lasted a long time, tests may be done right away. In some cases, this may include tests to rule out certain conditions or illnesses. How is this treated? Treatment for this condition depends on the cause. Home care is often enough to  relieve muscle pain. Your health care provider may also prescribe NSAIDs, such as ibuprofen. Follow these instructions at home: Medicines Take over-the-counter and prescription medicines only as told by your health care provider. Ask your health care provider if the medicine prescribed to you requires you to avoid driving or using machinery. Managing pain, swelling, and discomfort     If directed, put ice on the painful area. To do this: Put ice in a plastic bag. Place a towel between your skin and the bag. Leave the ice on for 20 minutes, 2-3 times a day. For the first 2 days of muscle soreness, or if there is swelling: Do not soak in hot baths. Do not use a hot tub, steam room, sauna, heating pad, or other heat source. After 48-72 hours, you may alternate between applying ice and applying heat as told by your health care provider. If directed, apply heat to the affected area as often as told by your health care provider. Use the heat source that your health care provider recommends, such as a moist heat pack or a heating pad. Place a towel between your skin and the heat source. Leave the heat on for 20-30 minutes. Remove the heat if your skin turns bright red. This is especially important if you are unable to feel pain, heat, or cold. You may have a greater risk of getting burned. If you have an injury, raise (elevate) the injured area above the level of your heart while you are sitting or lying down. Activity  If overuse is causing your muscle pain: Slow down   your activities until the pain goes away. Do regular, gentle exercises if you are not usually active. Warm up before exercising. Stretch before and after exercising. This can help lower the risk of muscle pain. Do not continue working out if the pain is severe. Severe pain could mean that you have injured a muscle. Do not lift anything that is heavier than 5-10 lb (2.3-4.5 kg), or the limit that you are told, until your health  care provider says that it is safe. Return to your normal activities as told by your health care provider. Ask your health care provider what activities are safe for you. General instructions Do not use any products that contain nicotine or tobacco, such as cigarettes, e-cigarettes, and chewing tobacco. These can delay healing. If you need help quitting, ask your health care provider. Keep all follow-up visits as told by your health care provider. This is important. Contact a health care provider if you have: Muscle pain that gets worse and medicines do not help. Muscle pain that lasts longer than 3 days. A rash or fever along with muscle pain. Muscle pain after a tick bite. Muscle pain while working out, even though you are in good physical condition. Redness, soreness, or swelling along with muscle pain. Muscle pain after starting a new medicine or changing the dose of a medicine. Get help right away if you have: Trouble breathing. Trouble swallowing. Muscle pain along with a stiff neck, fever, and vomiting. Severe muscle weakness or you cannot move part of your body. These symptoms may represent a serious problem that is an emergency. Do not wait to see if the symptoms will go away. Get medical help right away. Call your local emergency services (911 in the [place] Do not drive yourself to the hospital. Summary Muscle pain usually lasts only a short time and goes away without treatment. This condition is caused by using muscles in a new or different way after a period of inactivity. If your muscle pain lasts longer than 3 days, tell your health care provider. This information is not intended to replace advice given to you by your health care provider. Make sure you discuss any questions you have with your health care provider. Document Revised: 12/18/2020 Document Reviewed: 12/18/2020 Elsevier Patient Education  2023 [employer]  

## 2022-07-29 ENCOUNTER — Other Ambulatory Visit: Payer: Self-pay | Admitting: Family Medicine

## 2022-07-29 ENCOUNTER — Ambulatory Visit (HOSPITAL_BASED_OUTPATIENT_CLINIC_OR_DEPARTMENT_OTHER)
Admission: RE | Admit: 2022-07-29 | Discharge: 2022-07-29 | Disposition: A | Discharge status: Home / Self Care | Payer: Medicare HMO | Source: Ambulatory Visit | Attending: Family Medicine | Admitting: Family Medicine

## 2022-07-29 ENCOUNTER — Other Ambulatory Visit (INDEPENDENT_AMBULATORY_CARE_PROVIDER_SITE_OTHER): Payer: Medicare HMO

## 2022-07-29 DIAGNOSIS — M549 Dorsalgia, unspecified: Secondary | ICD-10-CM | POA: Insufficient documentation

## 2022-07-29 DIAGNOSIS — M546 Pain in thoracic spine: Secondary | ICD-10-CM | POA: Diagnosis not present

## 2022-07-29 DIAGNOSIS — M791 Myalgia, unspecified site: Secondary | ICD-10-CM | POA: Diagnosis not present

## 2022-07-29 LAB — URINE CULTURE
MICRO NUMBER:: 14570222
SPECIMEN QUALITY:: ADEQUATE

## 2022-07-29 LAB — CK: Total CK: 81 U/L (ref 7–177)

## 2022-07-29 NOTE — Addendum Note (Signed)
Addended by: Manuela Schwartz on: 07/29/2022 08:38 AM   Modules accepted: Orders

## 2022-07-29 NOTE — Assessment & Plan Note (Signed)
Pt does not tol statins Check labs

## 2022-07-29 NOTE — Addendum Note (Signed)
Addended by: Manuela Schwartz on: 07/29/2022 08:40 AM   Modules accepted: Orders

## 2022-07-30 LAB — CBC WITH DIFFERENTIAL/PLATELET
Basophils Absolute: 0 10*3/uL (ref 0.0–0.2)
Basos: 1 %
EOS (ABSOLUTE): 0.1 10*3/uL (ref 0.0–0.4)
Eos: 2 %
Hematocrit: 38.4 % (ref 34.0–46.6)
Hemoglobin: 13.2 g/dL (ref 11.1–15.9)
Immature Grans (Abs): 0 10*3/uL (ref 0.0–0.1)
Immature Granulocytes: 0 %
Lymphocytes Absolute: 1.2 10*3/uL (ref 0.7–3.1)
Lymphs: 29 %
MCH: 32.1 pg (ref 26.6–33.0)
MCHC: 34.4 g/dL (ref 31.5–35.7)
MCV: 93 fL (ref 79–97)
Monocytes Absolute: 0.4 10*3/uL (ref 0.1–0.9)
Monocytes: 9 %
Neutrophils Absolute: 2.4 10*3/uL (ref 1.4–7.0)
Neutrophils: 59 %
Platelets: 218 10*3/uL (ref 150–450)
RBC: 4.11 x10E6/uL (ref 3.77–5.28)
RDW: 11.6 % — ABNORMAL LOW (ref 11.7–15.4)
WBC: 4.1 10*3/uL (ref 3.4–10.8)

## 2022-07-30 LAB — LIPID PANEL
Chol/HDL Ratio: 2.2 ratio (ref 0.0–4.4)
Cholesterol, Total: 174 mg/dL (ref 100–199)
HDL: 80 mg/dL (ref >39)
LDL Chol Calc (NIH): 82 mg/dL (ref 0–99)
Triglycerides: 65 mg/dL (ref 0–149)
VLDL Cholesterol Cal: 12 mg/dL (ref 5–40)

## 2022-07-30 LAB — BASIC METABOLIC PANEL
BUN/Creatinine Ratio: 36 — ABNORMAL HIGH (ref 12–28)
BUN: 27 mg/dL (ref 8–27)
CO2: 24 mmol/L (ref 20–29)
Calcium: 8.9 mg/dL (ref 8.7–10.3)
Chloride: 108 mmol/L — ABNORMAL HIGH (ref 96–106)
Creatinine, Ser: 0.76 mg/dL (ref 0.57–1.00)
Glucose: 102 mg/dL — ABNORMAL HIGH (ref 70–99)
Potassium: 4.4 mmol/L (ref 3.5–5.2)
Sodium: 145 mmol/L — ABNORMAL HIGH (ref 134–144)
eGFR: 82 mL/min/{1.73_m2} (ref >59)

## 2022-07-30 LAB — VITAMIN D 25 HYDROXY (VIT D DEFICIENCY, FRACTURES): Vit D, 25-Hydroxy: 70.4 ng/mL (ref 30.0–100.0)

## 2022-07-30 LAB — B12 AND FOLATE PANEL
Folate: >20 ng/mL (ref >3.0)
Vitamin B-12: 568 pg/mL (ref 232–1245)

## 2022-07-30 LAB — HEPATIC FUNCTION PANEL
ALT: 22 IU/L (ref 0–32)
AST: 24 IU/L (ref 0–40)
Albumin: 4.1 g/dL (ref 3.8–4.8)
Alkaline Phosphatase: 63 IU/L (ref 44–121)
Bilirubin Total: 0.6 mg/dL (ref 0.0–1.2)
Bilirubin, Direct: 0.14 mg/dL (ref 0.00–0.40)
Total Protein: 6.1 g/dL (ref 6.0–8.5)

## 2022-07-30 LAB — HEMOGLOBIN A1C
Est. average glucose Bld gHb Est-mCnc: 103 mg/dL
Hgb A1c MFr Bld: 5.2 % (ref 4.8–5.6)

## 2022-07-30 LAB — TSH: TSH: 2 u[IU]/mL (ref 0.450–4.500)

## 2022-08-01 ENCOUNTER — Other Ambulatory Visit: Payer: Self-pay | Admitting: Family Medicine

## 2022-08-01 ENCOUNTER — Encounter: Payer: Self-pay | Admitting: Family Medicine

## 2022-08-01 DIAGNOSIS — M0580 Other rheumatoid arthritis with rheumatoid factor of unspecified site: Secondary | ICD-10-CM

## 2022-08-01 LAB — RHEUMATOID FACTOR: Rheumatoid fact SerPl-aCnc: 39 IU/mL — ABNORMAL HIGH (ref <14)

## 2022-08-01 LAB — ANTI-NUCLEAR AB-TITER (ANA TITER): ANA Titer 1: 1:40 {titer} — ABNORMAL HIGH

## 2022-08-01 LAB — ANA: Anti Nuclear Antibody (ANA): POSITIVE — AB

## 2022-08-16 ENCOUNTER — Encounter: Payer: Self-pay | Admitting: Family Medicine

## 2022-08-16 DIAGNOSIS — R195 Other fecal abnormalities: Secondary | ICD-10-CM | POA: Diagnosis not present

## 2022-08-16 DIAGNOSIS — R1010 Upper abdominal pain, unspecified: Secondary | ICD-10-CM | POA: Diagnosis not present

## 2022-08-16 DIAGNOSIS — R1030 Lower abdominal pain, unspecified: Secondary | ICD-10-CM | POA: Diagnosis not present

## 2022-08-19 ENCOUNTER — Encounter: Payer: Self-pay | Admitting: Family Medicine

## 2022-08-19 NOTE — Telephone Encounter (Signed)
Would you like for the pt to come in to have this done? I think she is suggesting that she does it herself at home.

## 2022-08-24 ENCOUNTER — Encounter: Payer: Self-pay | Admitting: Family Medicine

## 2022-08-26 ENCOUNTER — Encounter: Payer: Self-pay | Admitting: Family Medicine

## 2022-08-26 ENCOUNTER — Ambulatory Visit (INDEPENDENT_AMBULATORY_CARE_PROVIDER_SITE_OTHER): Payer: Medicare HMO | Admitting: Family Medicine

## 2022-08-26 ENCOUNTER — Telehealth: Payer: Self-pay | Admitting: *Deleted

## 2022-08-26 VITALS — BP 110/80 | HR 75 | Temp 97.7°F | Resp 18 | Ht 59.0 in | Wt 128.0 lb

## 2022-08-26 DIAGNOSIS — F321 Major depressive disorder, single episode, moderate: Secondary | ICD-10-CM | POA: Diagnosis not present

## 2022-08-26 NOTE — Progress Notes (Signed)
Subjective:   By signing my name below, I, Stephanie Lara, attest that this documentation has been prepared under the direction and in the presence of Ann Held, DO 08/26/22   Patient ID: Stephanie Lara, female    DOB: [birth year], 77 y.o.   MRN: [id number]  Chief Complaint  Patient presents with  . Depression    Pt states she has not started the Prozac yet due to being on vacation. Pt wants to do [facility]  . Follow-up    HPI Patient is in today for a 1 month follow up.   She has not yet started Prozac. She is first interested in getting genetic testing to see if Prozac is suitable for her. She recently went on a cruise to [place] and reports having a couple of "meltdowns" while on the trip. She has concerns about the side effects of Prozac that she is already experiencing (drowsiness, insomnia, and brain fog).   She has followed up with her gastrologist and reports she will be having an endoscopy soon.   Past Medical History:  Diagnosis Date  . Abnormal biliary HIDA scan 02/25/2021  . Chest pain 01/06/2021  . Chicken pox   . Contact dermatitis due to plants, except food 10/20/2008   Overview:  Dermatitis Due To Contact With Plants  . Coronary artery calcification 03/01/2021  . Dizzy 06/21/2018  . Fatigue 11/11/2016  . Globus pharyngeus 04/18/2013  . Hot flashes 04/26/2019  . Hyperlipidemia   . Insomnia 03/03/2015  . Lipoma of neck 11/02/2011  . Mitral regurgitation 04/07/2021  . Myalgia due to statin 03/04/2021  . Other headache syndrome 06/21/2018  . Preoperative cardiovascular examination 03/01/2021  . Preventative health care 01/09/2017  . Primary insomnia 12/22/2015  . Rheumatoid arthritis ([place])   . RUQ pain 01/06/2021  . Upper respiratory tract infection 05/24/2018  . Urinary incontinence 03/03/2015    Past Surgical History:  Procedure Laterality Date  . ABDOMINOPLASTY    . BREAST BIOPSY    . CHOLECYSTECTOMY  03/08/2021  . LIPOMA EXCISION     neck  . PLACEMENT OF  BREAST IMPLANTS    . RHINOPLASTY    . SEPTOPLASTY      Family History  Problem Relation Age of Onset  . Hypertension Mother   . Heart disease Father   . Sudden death Father   . Heart attack Father   . Gallbladder disease Father   . Deep vein thrombosis Father   . Diabetes Paternal Grandmother   . Hypertension Brother     Social History   Socioeconomic History  . Marital status: Married    Spouse name: Not on file  . Number of children: Not on file  . Years of education: Not on file  . Highest education level: Not on file  Occupational History  . Occupation: retired Teacher, 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[employer]  Tobacco Use  . Smoking status: Never  . Smokeless tobacco: Never  Vaping Use  . Vaping Use: Never used  Substance and Sexual Activity  . Alcohol use: Yes    Alcohol/week: 0.0 standard drinks of alcohol    Comment: Occ  . Drug use: No  . Sexual activity: Not on file  Other Topics Concern  . Not on file  Social History Narrative  . Not on file   Social Determinants of Health   Financial Resource Strain: Not on file  Food Insecurity: Not on file  Transportation Needs: Not on file  Physical Activity: Not on file  Stress: Not  on file  Social Connections: Not on file  Intimate Partner Violence: Not on file    Outpatient Medications Prior to Visit  Medication Sig Dispense Refill  . Ascorbic Acid (VITAMIN C) 1000 MG tablet Take 1,000 mg by mouth daily.    Marland Kitchen aspirin EC 81 MG tablet Take 1 tablet (81 mg total) by mouth daily. Swallow whole. 90 tablet 3  . b complex vitamins capsule Take 1 capsule by mouth daily.    Marland Kitchen CALCIUM PO Take 1 tablet by mouth daily.    . Cholecalciferol (D3 PO) Take 1 tablet by mouth daily.    . Evolocumab (REPATHA SURECLICK) [id number] MG/ML SOAJ INJECT 140 MG INTO THE SKIN EVERY 14 (FOURTEEN) DAYS. 6 mL 2  . FLUoxetine (PROZAC) 10 MG capsule Take 1 capsule (10 mg total) by mouth daily. 90 capsule 3  . LATISSE 0.03 % ophthalmic solution Place 1 drop into  both eyes daily.    . nitroGLYCERIN (NITROSTAT) 0.4 MG SL tablet Place 0.4 mg under the tongue every 5 (five) minutes as needed for chest pain.    . Omega-3 Fatty Acids (FISH OIL PO) Take 1 tablet by mouth daily.    . pantoprazole (PROTONIX) 40 MG tablet Take 40 mg by mouth daily.    . TURMERIC PO Take 1 tablet by mouth daily.    Marland Kitchen VITAMIN E PO Take 1 tablet by mouth daily.     No facility-administered medications prior to visit.    Allergies  Allergen Reactions  . Definity [Perflutren Lipid Microsphere] Other (See Comments)    Severe abdominal Pain  . Lipitor [Atorvastatin]     myalgias  . Pitavastatin     Myalgias   . Pravastatin     myalgias    Review of Systems  Constitutional:  Negative for fever and malaise/fatigue.  HENT:  Negative for congestion.   Eyes:  Negative for blurred vision.  Respiratory:  Negative for shortness of breath.   Cardiovascular:  Negative for chest pain, palpitations and leg swelling.  Gastrointestinal:  Negative for abdominal pain, blood in stool and nausea.  Genitourinary:  Negative for dysuria and frequency.  Musculoskeletal:  Negative for falls.  Skin:  Negative for rash.  Neurological:  Negative for dizziness, loss of consciousness and headaches.  Endo/Heme/Allergies:  Negative for environmental allergies.  Psychiatric/Behavioral:  Positive for depression. Negative for substance abuse and suicidal ideas. The patient is not nervous/anxious.       Objective:    Physical Exam Vitals and nursing note reviewed.  Constitutional:      Appearance: She is well-developed.  HENT:     Head: Normocephalic and atraumatic.  Eyes:     Conjunctiva/sclera: Conjunctivae normal.  Neck:     Thyroid: No thyromegaly.     Vascular: No carotid bruit or JVD.  Cardiovascular:     Rate and Rhythm: Normal rate and regular rhythm.     Heart sounds: Normal heart sounds. No murmur heard. Pulmonary:     Effort: Pulmonary effort is normal. No respiratory  distress.     Breath sounds: Normal breath sounds. No wheezing or rales.  Chest:     Chest wall: No tenderness.  Musculoskeletal:     Cervical back: Normal range of motion and neck supple.  Neurological:     Mental Status: She is alert and oriented to person, place, and time.  Psychiatric:        Mood and Affect: Mood is depressed. Affect is not flat or tearful.  Behavior: Behavior normal.        Thought Content: Thought content normal.        Cognition and Memory: Cognition normal.        Judgment: Judgment normal.   BP 110/80 (BP Location: Left Arm, Patient Position: Sitting, Cuff Size: Normal)   Pulse 75   Temp 97.7 F (36.5 C) (Oral)   Resp 18   Ht 4\' 11"  (1.499 m)   Wt 128 lb (58.1 kg)   SpO2 94%   BMI 25.85 kg/m  Wt Readings from Last 3 Encounters:  08/26/22 128 lb (58.1 kg)  07/28/22 131 lb 3.2 oz (59.5 kg)  07/27/22 129 lb 0.6 oz (58.5 kg)       Assessment & Plan:  There are no diagnoses linked to this encounter.   I,Rachel Rivera,acting as a Education administrator for [employer], DO.,have documented all relevant documentation on the behalf of Ann Held, DO,as directed by  Ann Held, DO while in the presence of Ann Held, DO.   I, Stephanie Lara, personally preformed the services described in this documentation.  All medical record entries made by the scribe were at my direction and in my presence.  I have reviewed the chart and discharge instructions (if applicable) and agree that the record reflects my personal performance and is accurate and complete. 08/26/22   Stephanie Lara

## 2022-08-26 NOTE — Telephone Encounter (Signed)
Patient was here today and we did wrong gene testing.  She needed the Genes[facility] test.  Patient notified and she will be back on Monday for [place] to recollect.

## 2022-08-28 NOTE — Assessment & Plan Note (Signed)
Pt wants to hold off on med until genetic test comes back

## 2022-08-29 ENCOUNTER — Telehealth: Payer: Self-pay | Admitting: *Deleted

## 2022-08-29 DIAGNOSIS — F321 Major depressive disorder, single episode, moderate: Secondary | ICD-10-CM | POA: Diagnosis not present

## 2022-08-29 DIAGNOSIS — F322 Major depressive disorder, single episode, severe without psychotic features: Secondary | ICD-10-CM | POA: Diagnosis not present

## 2022-08-29 NOTE — Telephone Encounter (Signed)
Pt was seen in office today. Genesight test was collected/ordered. Sample was packaged and placed up front for [employer] to pick up 08/29/22. Consent and insurance info were in the package.   Confirmation number for pickup is [id number]  Tracking number: [id number]

## 2022-08-29 NOTE — Telephone Encounter (Signed)
Pt came into office and wanted to check on referral.  She has been waiting about 2 weeks and wants and update.  She called their office and they have advised her that they have not got referral from [place].    I have called the rheumatology office twice and they said they will call back.

## 2022-08-30 DIAGNOSIS — K317 Polyp of stomach and duodenum: Secondary | ICD-10-CM | POA: Diagnosis not present

## 2022-08-30 DIAGNOSIS — R109 Unspecified abdominal pain: Secondary | ICD-10-CM | POA: Diagnosis not present

## 2022-08-30 DIAGNOSIS — R1013 Epigastric pain: Secondary | ICD-10-CM | POA: Diagnosis not present

## 2022-08-30 DIAGNOSIS — R195 Other fecal abnormalities: Secondary | ICD-10-CM | POA: Diagnosis not present

## 2022-09-05 ENCOUNTER — Encounter: Payer: Self-pay | Admitting: Cardiology

## 2022-09-08 ENCOUNTER — Telehealth: Payer: Self-pay | Admitting: *Deleted

## 2022-09-08 ENCOUNTER — Telehealth: Payer: Self-pay | Admitting: Family Medicine

## 2022-09-08 NOTE — Telephone Encounter (Signed)
Most recent OV note and labs faxed

## 2022-09-08 NOTE — Telephone Encounter (Signed)
Sandy with [facility] requesting office notes, labs, and prev. Rheumatology records if we have it to be faxed to them at [phone].

## 2022-09-08 NOTE — Telephone Encounter (Signed)
error 

## 2022-09-12 ENCOUNTER — Telehealth: Payer: Self-pay | Admitting: Family Medicine

## 2022-09-12 NOTE — Telephone Encounter (Signed)
Called and advised that the wrong test was done and this test could be disregarded

## 2022-09-12 NOTE — Telephone Encounter (Signed)
[employer] called to schedule a P2P Review for pt's [place] Cycotropic Panel. [facility] Physician can be reached at the following:  P: [phone] x [phone] Ref: [id number] Offer Exp: 4.3.24

## 2022-09-14 ENCOUNTER — Encounter: Payer: Self-pay | Admitting: Family Medicine

## 2022-09-14 MED ORDER — DESVENLAFAXINE SUCCINATE ER 50 MG PO TB24: 50.0000 mg | ORAL_TABLET | Freq: Every day | ORAL | 2 refills | Status: DC

## 2022-09-15 DIAGNOSIS — F4322 Adjustment disorder with anxiety: Secondary | ICD-10-CM | POA: Diagnosis not present

## 2022-09-16 ENCOUNTER — Encounter: Payer: Self-pay | Admitting: Family Medicine

## 2022-09-16 MED ORDER — DESVENLAFAXINE SUCCINATE ER 50 MG PO TB24: 50.0000 mg | ORAL_TABLET | Freq: Every day | ORAL | 0 refills | Status: DC

## 2022-09-28 ENCOUNTER — Encounter: Payer: Self-pay | Admitting: Family Medicine

## 2022-12-06 ENCOUNTER — Encounter: Payer: Self-pay | Admitting: Family Medicine

## 2022-12-06 ENCOUNTER — Ambulatory Visit (INDEPENDENT_AMBULATORY_CARE_PROVIDER_SITE_OTHER): Payer: Medicare HMO | Admitting: Family Medicine

## 2022-12-06 VITALS — BP 134/88 | HR 69 | Temp 97.8°F | Resp 18 | Ht 59.0 in | Wt 128.4 lb

## 2022-12-06 DIAGNOSIS — L089 Local infection of the skin and subcutaneous tissue, unspecified: Secondary | ICD-10-CM

## 2022-12-06 DIAGNOSIS — S60940A Unspecified superficial injury of right index finger, initial encounter: Secondary | ICD-10-CM

## 2022-12-06 DIAGNOSIS — L259 Unspecified contact dermatitis, unspecified cause: Secondary | ICD-10-CM | POA: Diagnosis not present

## 2022-12-06 MED ORDER — METHYLPREDNISOLONE ACETATE 80 MG/ML IJ SUSP
80.0000 mg | Freq: Once | INTRAMUSCULAR | Status: AC
Start: 2022-12-06 — End: 2022-12-06
  Administered 2022-12-06: 80 mg via INTRAMUSCULAR

## 2022-12-06 MED ORDER — DOXYCYCLINE HYCLATE 100 MG PO TABS
100.0000 mg | ORAL_TABLET | Freq: Two times a day (BID) | ORAL | 0 refills | Status: DC
Start: 2022-12-06 — End: 2023-05-19

## 2022-12-06 MED ORDER — PREDNISONE 10 MG PO TABS
ORAL_TABLET | ORAL | 0 refills | Status: DC
Start: 2022-12-06 — End: 2023-05-19

## 2022-12-06 NOTE — Progress Notes (Signed)
Established Patient Office Visit  Subjective   Patient ID: Stephanie Lara, female    DOB: 06-23-1945  Age: 77 y.o. MRN: [id number]  Chief Complaint  Patient presents with   Rash    Pt states having rash, x10 days ago, pt states itching, no discharge, legs, chest, back., and hands    HPI Discussed the use of AI scribe software for clinical note transcription with the patient, who gave verbal consent to proceed.  History of Present Illness   The patient presents with a 10-day history of a skin rash that started as a hard lump on the left arm. The rash has since spread to include the left shoulder, chest, neck, and right hand. The rash is itchy, with the exception of a tender spot on the right index finger. The patient denies any recent outdoor activities or sun exposure that could have triggered the rash. She has tried over-the-counter treatments such as Benadryl and anti-itch creams with no improvement.      Patient Active Problem List   Diagnosis Date Noted   Depression, major, single episode, moderate (HCC) 07/28/2022   Mid back pain 07/28/2022   Leukocytes in urine 07/28/2022   Statin myopathy 07/27/2022   Mitral regurgitation 04/07/2021   Myalgia due to statin 03/04/2021   Coronary artery calcification 03/01/2021   Preoperative cardiovascular examination 03/01/2021   Abnormal biliary HIDA scan 02/25/2021   Chicken pox 01/06/2021   Hyperlipidemia 01/06/2021   Chest pain 01/06/2021   RUQ pain 01/06/2021   Hot flashes 04/26/2019   Dizzy 06/21/2018   Other headache syndrome 06/21/2018   Upper respiratory tract infection 05/24/2018   Preventative health care 01/09/2017   Fatigue 11/11/2016   Primary insomnia 12/22/2015   Urinary incontinence 03/03/2015   Rheumatoid arthritis (HCC) 03/03/2015   Insomnia 03/03/2015   Globus pharyngeus 04/18/2013   Lipoma of neck 11/02/2011   Contact dermatitis due to plants, except food 10/20/2008   Past Medical History:  Diagnosis Date    Abnormal biliary HIDA scan 02/25/2021   Chest pain 01/06/2021   Chicken pox    Contact dermatitis due to plants, except food 10/20/2008   Overview:  Dermatitis Due To Contact With Plants   Coronary artery calcification 03/01/2021   Dizzy 06/21/2018   Fatigue 11/11/2016   Globus pharyngeus 04/18/2013   Hot flashes 04/26/2019   Hyperlipidemia    Insomnia 03/03/2015   Lipoma of neck 11/02/2011   Mitral regurgitation 04/07/2021   Myalgia due to statin 03/04/2021   Other headache syndrome 06/21/2018   Preoperative cardiovascular examination 03/01/2021   Preventative health care 01/09/2017   Primary insomnia 12/22/2015   Rheumatoid arthritis (HCC)    RUQ pain 01/06/2021   Upper respiratory tract infection 05/24/2018   Urinary incontinence 03/03/2015   Past Surgical History:  Procedure Laterality Date   ABDOMINOPLASTY     BREAST BIOPSY     CHOLECYSTECTOMY  03/08/2021   LIPOMA EXCISION     neck   PLACEMENT OF BREAST IMPLANTS     RHINOPLASTY     SEPTOPLASTY     Social History   Tobacco Use   Smoking status: Never   Smokeless tobacco: Never  Vaping Use   Vaping Use: Never used  Substance Use Topics   Alcohol use: Yes    Alcohol/week: 0.0 standard drinks of alcohol    Comment: Occ   Drug use: No   Social History   Socioeconomic History   Marital status: Married    Spouse name: Not on  file   Number of children: Not on file   Years of education: Not on file   Highest education level: 12th grade  Occupational History   Occupation: retired Nurse, children's-- [employer]  Tobacco Use   Smoking status: Never   Smokeless tobacco: Never  Vaping Use   Vaping Use: Never used  Substance and Sexual Activity   Alcohol use: Yes    Alcohol/week: 0.0 standard drinks of alcohol    Comment: Occ   Drug use: No   Sexual activity: Not on file  Other Topics Concern   Not on file  Social History Narrative   Not on file   Social Determinants of Health   Financial Resource Strain: Low Risk   (12/05/2022)   Overall Financial Resource Strain (CARDIA)    Difficulty of Paying Living Expenses: Not hard at all  Food Insecurity: No Food Insecurity (12/05/2022)   Hunger Vital Sign    Worried About Running Out of Food in the Last Year: Never true    Ran Out of Food in the Last Year: Never true  Transportation Needs: No Transportation Needs (12/05/2022)   PRAPARE - Administrator, Civil Service (Medical): No    Lack of Transportation (Non-Medical): No  Physical Activity: Sufficiently Active (12/05/2022)   Exercise Vital Sign    Days of Exercise per Week: 4 days    Minutes of Exercise per Session: 40 min  Stress: No Stress Concern Present (12/05/2022)   [employer] of Occupational Health - Occupational Stress Questionnaire    Feeling of Stress : Not at all  Social Connections: Unknown (12/05/2022)   Social Connection and Isolation Panel [NHANES]    Frequency of Communication with Friends and Family: Three times a week    Frequency of Social Gatherings with Friends and Family: Twice a week    Attends Religious Services: More than 4 times per year    Active Member of [employer] or Organizations: Not on file    Attends Engineer, structural: Not on file    Marital Status: Married  Catering manager Violence: Not on file   Family Status  Relation Name Status   Mother  Deceased   Father  Deceased   Sister  Alive   Brother  Alive   PGM  (Not Specified)   Brother  (Not Specified)   Family History  Problem Relation Age of Onset   Hypertension Mother    Heart disease Father    Sudden death Father    Heart attack Father    Gallbladder disease Father    Deep vein thrombosis Father    Diabetes Paternal Grandmother    Hypertension Brother    Allergies  Allergen Reactions   Definity [Perflutren Lipid Microsphere] Other (See Comments)    Severe abdominal Pain   Lipitor [Atorvastatin]     myalgias   Pitavastatin     Myalgias    Pravastatin     myalgias       ROS    Objective:     BP 134/88 (BP Location: Right Arm, Patient Position: Sitting, Cuff Size: Normal)   Pulse 69   Temp 97.8 F (36.6 C) (Oral)   Resp 18   Ht 4\' 11"  (1.499 m)   Wt 128 lb 6.4 oz (58.2 kg)   SpO2 98%   BMI 25.93 kg/m  BP Readings from Last 3 Encounters:  12/06/22 134/88  08/26/22 110/80  07/28/22 136/78   Wt Readings from Last 3 Encounters:  12/06/22 128  lb 6.4 oz (58.2 kg)  08/26/22 128 lb (58.1 kg)  07/28/22 131 lb 3.2 oz (59.5 kg)   SpO2 Readings from Last 3 Encounters:  12/06/22 98%  08/26/22 94%  07/28/22 96%      Physical Exam Vitals and nursing note reviewed.  Constitutional:      Appearance: She is well-developed.  HENT:     Head: Normocephalic and atraumatic.  Eyes:     Conjunctiva/sclera: Conjunctivae normal.  Neck:     Thyroid: No thyromegaly.     Vascular: No carotid bruit or JVD.  Cardiovascular:     Rate and Rhythm: Normal rate and regular rhythm.     Heart sounds: Normal heart sounds. No murmur heard. Pulmonary:     Effort: Pulmonary effort is normal. No respiratory distress.     Breath sounds: Normal breath sounds. No wheezing or rales.  Chest:     Chest wall: No tenderness.  Musculoskeletal:     Cervical back: Normal range of motion and neck supple.  Skin:    Findings: Erythema, lesion and rash present.     Comments: Patches of errythematous rash on back of neck , arms , chest  Wart like lesion on r index finger and sore between4-5 fingers   Neurological:     Mental Status: She is alert and oriented to person, place, and time.      No results found for any visits on 12/06/22.  Last CBC Lab Results  Component Value Date   WBC 4.1 07/28/2022   HGB 13.2 07/28/2022   HCT 38.4 07/28/2022   MCV 93 07/28/2022   MCH 32.1 07/28/2022   RDW 11.6 (L) 07/28/2022   PLT 218 07/28/2022   Last metabolic panel Lab Results  Component Value Date   GLUCOSE 102 (H) 07/28/2022   NA 145 (H) 07/28/2022   K 4.4 07/28/2022    CL 108 (H) 07/28/2022   CO2 24 07/28/2022   BUN 27 07/28/2022   CREATININE 0.76 07/28/2022   EGFR 82 07/28/2022   CALCIUM 8.9 07/28/2022   PROT 6.1 07/28/2022   ALBUMIN 4.1 07/28/2022   BILITOT 0.6 07/28/2022   ALKPHOS 63 07/28/2022   AST 24 07/28/2022   ALT 22 07/28/2022   Last lipids Lab Results  Component Value Date   CHOL 174 07/28/2022   HDL 80 07/28/2022   LDLCALC 82 07/28/2022   TRIG 65 07/28/2022   CHOLHDL 2.2 07/28/2022   Last hemoglobin A1c Lab Results  Component Value Date   HGBA1C 5.2 07/28/2022   Last thyroid functions Lab Results  Component Value Date   TSH 2.000 07/28/2022   Last vitamin D Lab Results  Component Value Date   VD25OH 70.4 07/28/2022   Last vitamin B12 and Folate Lab Results  Component Value Date   VITAMINB12 568 07/28/2022   FOLATE >20.0 07/28/2022      The 10-year ASCVD risk score (Arnett DK, et al., 2019) is: 19.6%    Assessment & Plan:   Problem List Items Addressed This Visit   None Visit Diagnoses     Superficial injury of right index finger with infection    -  Primary   Relevant Medications   doxycycline (VIBRA-TABS) 100 MG tablet   Contact dermatitis, unspecified contact dermatitis type, unspecified trigger       Relevant Medications   predniSONE (DELTASONE) 10 MG tablet   methylPREDNISolone acetate (DEPO-MEDROL) injection 80 mg (Completed)     Assessment and Plan    Skin Rash: Multiple, sudden  onset, non-blistering, non-draining, tender and itchy skin lesions on the left arm, chest, neck, and right hand. No known exposure to allergens or irritants. No recent changes in detergents, soaps, or new medications. -Start Prednisone and administer a shot to alleviate itching. -Topical antibiotic for the tender lesion on the right index finger. -If no improvement, consider referral to a hand specialist due to the location of the lesion on the joint.  Bruise: Self-reported bruise on the left arm. -No intervention  required at this time.        No follow-ups on file.    Donato Schultz, DO

## 2022-12-07 ENCOUNTER — Other Ambulatory Visit: Payer: Self-pay | Admitting: Family Medicine

## 2023-01-17 DIAGNOSIS — H5203 Hypermetropia, bilateral: Secondary | ICD-10-CM | POA: Diagnosis not present

## 2023-01-17 DIAGNOSIS — H2513 Age-related nuclear cataract, bilateral: Secondary | ICD-10-CM | POA: Diagnosis not present

## 2023-01-18 DIAGNOSIS — R140 Abdominal distension (gaseous): Secondary | ICD-10-CM | POA: Diagnosis not present

## 2023-01-18 DIAGNOSIS — R109 Unspecified abdominal pain: Secondary | ICD-10-CM | POA: Diagnosis not present

## 2023-01-18 DIAGNOSIS — K5909 Other constipation: Secondary | ICD-10-CM | POA: Diagnosis not present

## 2023-01-19 ENCOUNTER — Encounter: Payer: Medicare HMO | Admitting: Internal Medicine

## 2023-01-27 DIAGNOSIS — Z0100 Encounter for examination of eyes and vision without abnormal findings: Secondary | ICD-10-CM | POA: Diagnosis not present

## 2023-02-06 ENCOUNTER — Other Ambulatory Visit: Payer: Self-pay | Admitting: Family Medicine

## 2023-05-15 DIAGNOSIS — L570 Actinic keratosis: Secondary | ICD-10-CM | POA: Diagnosis not present

## 2023-05-19 ENCOUNTER — Telehealth (INDEPENDENT_AMBULATORY_CARE_PROVIDER_SITE_OTHER): Payer: Medicare HMO | Admitting: Family Medicine

## 2023-05-19 ENCOUNTER — Telehealth: Payer: Self-pay

## 2023-05-19 ENCOUNTER — Encounter: Payer: Self-pay | Admitting: Family Medicine

## 2023-05-19 VITALS — Ht 59.0 in | Wt 129.4 lb

## 2023-05-19 DIAGNOSIS — M069 Rheumatoid arthritis, unspecified: Secondary | ICD-10-CM | POA: Diagnosis not present

## 2023-05-19 DIAGNOSIS — E785 Hyperlipidemia, unspecified: Secondary | ICD-10-CM | POA: Diagnosis not present

## 2023-05-19 DIAGNOSIS — I2510 Atherosclerotic heart disease of native coronary artery without angina pectoris: Secondary | ICD-10-CM | POA: Diagnosis not present

## 2023-05-19 MED ORDER — WEGOVY 0.25 MG/0.5ML ~~LOC~~ SOAJ
0.2500 mg | SUBCUTANEOUS | 0 refills | Status: DC
Start: 2023-05-19 — End: 2023-07-26

## 2023-05-19 NOTE — Progress Notes (Signed)
MyChart Video Visit    Virtual Visit via Video Note   This patient is at least at moderate risk for complications without adequate follow up. This format is felt to be most appropriate for this patient at this time. Physical exam was limited by quality of the video and audio technology used for the visit. Stephanie Lara  was able to get the patient set up on a video visit.  Patient location: home Patient and provider in visit Provider location: Office  I discussed the limitations of evaluation and management by telemedicine and the availability of in person appointments. The patient expressed understanding and agreed to proceed.  Visit Date: 05/19/2023  Today's healthcare provider: Donato Schultz, DO     Subjective:    Patient ID: Stephanie Lara, female    DOB: Aug 20, 1945, 77 y.o.   MRN: [id number]  Chief Complaint  Patient presents with   Weight Loss    HPI Patient is in today for weight loss med. Discussed the use of AI scribe software for clinical note transcription with the patient, who gave verbal consent to proceed.  History of Present Illness   The patient, with a history of high cholesterol, high inflammation, and a family history of diabetes, presents with ongoing struggles with weight management. Despite maintaining a healthy diet and regular exercise, she has been grappling with an excess of approximately 15 pounds for the past 20 years. She has recently switched to low-calorie wine as part of her dietary modifications. The patient is interested in exploring weight loss injections as a potential solution to her weight management issues. She is currently weighing 129.4 pounds at a height of 4'11". The patient has also been diagnosed with rheumatoid arthritis and hyperlipidemia.       Past Medical History:  Diagnosis Date   Abnormal biliary HIDA scan 02/25/2021   Chest pain 01/06/2021   Chicken pox    Contact dermatitis due to plants, except food 10/20/2008    Overview:  Dermatitis Due To Contact With Plants   Coronary artery calcification 03/01/2021   Dizzy 06/21/2018   Fatigue 11/11/2016   Globus pharyngeus 04/18/2013   Hot flashes 04/26/2019   Hyperlipidemia    Insomnia 03/03/2015   Lipoma of neck 11/02/2011   Mitral regurgitation 04/07/2021   Myalgia due to statin 03/04/2021   Other headache syndrome 06/21/2018   Preoperative cardiovascular examination 03/01/2021   Preventative health care 01/09/2017   Primary insomnia 12/22/2015   Rheumatoid arthritis (HCC)    RUQ pain 01/06/2021   Upper respiratory tract infection 05/24/2018   Urinary incontinence 03/03/2015    Past Surgical History:  Procedure Laterality Date   ABDOMINOPLASTY     BREAST BIOPSY     CHOLECYSTECTOMY  03/08/2021   LIPOMA EXCISION     neck   PLACEMENT OF BREAST IMPLANTS     RHINOPLASTY     SEPTOPLASTY      Family History  Problem Relation Age of Onset   Hypertension Mother    Heart disease Father    Sudden death Father    Heart attack Father    Gallbladder disease Father    Deep vein thrombosis Father    Diabetes Paternal Grandmother    Hypertension Brother     Social History   Socioeconomic History   Marital status: Married    Spouse name: Not on file   Number of children: Not on file   Years of education: Not on file   Highest education level: Associate degree:  occupational, Scientist, product/process development, or vocational program  Occupational History   Occupation: retired Nurse, children's-- [employer]  Tobacco Use   Smoking status: Never   Smokeless tobacco: Never  Vaping Use   Vaping status: Never Used  Substance and Sexual Activity   Alcohol use: Yes    Alcohol/week: 0.0 standard drinks of alcohol    Comment: Occ   Drug use: No   Sexual activity: Not on file  Other Topics Concern   Not on file  Social History Narrative   Not on file   Social Determinants of Health   Financial Resource Strain: Low Risk  (05/18/2023)   Overall Financial Resource Strain (CARDIA)     Difficulty of Paying Living Expenses: Not hard at all  Food Insecurity: No Food Insecurity (05/18/2023)   Hunger Vital Sign    Worried About Running Out of Food in the Last Year: Never true    Ran Out of Food in the Last Year: Never true  Transportation Needs: No Transportation Needs (05/18/2023)   PRAPARE - Administrator, Civil Service (Medical): No    Lack of Transportation (Non-Medical): No  Physical Activity: Sufficiently Active (05/18/2023)   Exercise Vital Sign    Days of Exercise per Week: 3 days    Minutes of Exercise per Session: 60 min  Stress: No Stress Concern Present (05/18/2023)   [employer] of Occupational Health - Occupational Stress Questionnaire    Feeling of Stress : Not at all  Social Connections: Socially Integrated (05/18/2023)   Social Connection and Isolation Panel [NHANES]    Frequency of Communication with Friends and Family: More than three times a week    Frequency of Social Gatherings with Friends and Family: Twice a week    Attends Religious Services: More than 4 times per year    Active Member of [employer] or Organizations: Yes    Attends Banker Meetings: 1 to 4 times per year    Marital Status: Married  Catering manager Violence: Unknown (09/15/2021)   Received from [facility], [facility]   HITS    Physically Hurt: Not on file    Insult or Talk Down To: Not on file    Threaten Physical Harm: Not on file    Scream or Curse: Not on file    Outpatient Medications Prior to Visit  Medication Sig Dispense Refill   Ascorbic Acid (VITAMIN C) 1000 MG tablet Take 1,000 mg by mouth daily.     aspirin EC 81 MG tablet Take 1 tablet (81 mg total) by mouth daily. Swallow whole. 90 tablet 3   b complex vitamins capsule Take 1 capsule by mouth daily.     CALCIUM PO Take 1 tablet by mouth daily.     Cholecalciferol (D3 PO) Take 1 tablet by mouth daily.     desvenlafaxine (PRISTIQ) 50 MG 24 hr tablet TAKE 1 TABLET EVERY DAY 90 tablet 3    Evolocumab (REPATHA SURECLICK) 140 MG/ML SOAJ INJECT 140 MG INTO THE SKIN EVERY 14 (FOURTEEN) DAYS. 6 mL 2   LATISSE 0.03 % ophthalmic solution Place 1 drop into both eyes daily.     nitroGLYCERIN (NITROSTAT) 0.4 MG SL tablet Place 0.4 mg under the tongue every 5 (five) minutes as needed for chest pain.     TURMERIC PO Take 1 tablet by mouth daily.     VITAMIN E PO Take 1 tablet by mouth daily.     doxycycline (VIBRA-TABS) 100 MG tablet Take 1 tablet (100 mg  total) by mouth 2 (two) times daily. 20 tablet 0   Omega-3 Fatty Acids (FISH OIL PO) Take 1 tablet by mouth daily.     pantoprazole (PROTONIX) 40 MG tablet Take 40 mg by mouth daily.     predniSONE (DELTASONE) 10 MG tablet TAKE 3 TABLETS PO QD FOR 3 DAYS THEN TAKE 2 TABLETS PO QD FOR 3 DAYS THEN TAKE 1 TABLET PO QD FOR 3 DAYS THEN TAKE 1/2 TAB PO QD FOR 3 DAYS 20 tablet 0   No facility-administered medications prior to visit.    Allergies  Allergen Reactions   Definity [Perflutren Lipid Microsphere] Other (See Comments)    Severe abdominal Pain   Lipitor [Atorvastatin]     myalgias   Pitavastatin     Myalgias    Pravastatin     myalgias    Review of Systems  Constitutional:  Negative for fever and malaise/fatigue.  HENT:  Negative for congestion.   Eyes:  Negative for blurred vision.  Respiratory:  Negative for shortness of breath.   Cardiovascular:  Negative for chest pain, palpitations and leg swelling.  Gastrointestinal:  Negative for abdominal pain, blood in stool and nausea.  Genitourinary:  Negative for dysuria and frequency.  Musculoskeletal:  Negative for falls.  Skin:  Negative for rash.  Neurological:  Negative for dizziness, loss of consciousness and headaches.  Endo/Heme/Allergies:  Negative for environmental allergies.  Psychiatric/Behavioral:  Negative for depression. The patient is not nervous/anxious.        Objective:    Physical Exam Vitals and nursing note reviewed.  Constitutional:       Comments: Overweight   Neurological:     Mental Status: She is alert.  Psychiatric:        Mood and Affect: Mood normal.        Behavior: Behavior normal.        Thought Content: Thought content normal.        Judgment: Judgment normal.     Ht 4\' 11"  (1.499 m)   Wt 129 lb 6.4 oz (58.7 kg)   BMI 26.14 kg/m  Wt Readings from Last 3 Encounters:  05/19/23 129 lb 6.4 oz (58.7 kg)  12/06/22 128 lb 6.4 oz (58.2 kg)  08/26/22 128 lb (58.1 kg)       Assessment & Plan:  Coronary artery disease involving native coronary artery of native heart without angina pectoris -     ZOXWRU; Inject 0.25 mg into the skin once a week.  Dispense: 2 mL; Refill: 0  Rheumatoid arthritis, involving unspecified site, unspecified whether rheumatoid factor present (HCC) -     EAVWUJ; Inject 0.25 mg into the skin once a week.  Dispense: 2 mL; Refill: 0  Hyperlipidemia, unspecified hyperlipidemia type -     WJXBJY; Inject 0.25 mg into the skin once a week.  Dispense: 2 mL; Refill: 0   Assessment and Plan    Obesity   She has been struggling with weight management for twenty years despite maintaining a healthy diet and regular exercise, with a notable family history of diabetes. She expresses concerns about high cholesterol and inflammation. Her current weight is 129.4 lbs at a height of 4'11". She inquired about Wegovy and Zipbound for weight management. After discussing Medicare coverage and the medication process, we decided on Wegovy, a once-weekly, refrigerated injection administered with a pre-loaded pen, with Zipbound as an alternative if Reginal Lutes is not covered. Insurance may require a Wegovy trial first. We will prescribe Wegovy, 1 pen  per week, to be picked up from [employer], where the pharmacist will demonstrate pen use. We plan to follow up in three months to assess progress and adjust the dosage if necessary. She is to notify the office if any insurance or medication issues  arise.  Hyperlipidemia   Her high cholesterol supports the prescription of Wegovy for weight management. We will attach the hyperlipidemia diagnosis to the [facility] prescription for insurance purposes.  Rheumatoid Arthritis   The diagnosis of rheumatoid arthritis also supports the [facility] prescription for weight management. We will attach the rheumatoid arthritis diagnosis to the [facility] prescription for insurance.  General Health Maintenance   She is proactive about her health, having switched to low-calorie wine and maintaining a healthy diet, fully aware of the importance of weight management due to a family history of diabetes. She will continue her current exercise and healthy eating regimen and monitor weight and health parameters regularly.  Follow-up   We plan to follow up in three months. She is to notify the office when the medication is received to arrange the sending of the next dose. The office will remind her about the follow-up appointment.        I discussed the assessment and treatment plan with the patient. The patient was provided an opportunity to ask questions and all were answered. The patient agreed with the plan and demonstrated an understanding of the instructions.   The patient was advised to call back or seek an in-person evaluation if the symptoms worsen or if the condition fails to improve as anticipated.  Donato Schultz, DO [place]  [facility] at [facility] [phone] (phone) [phone] (fax)  [facility]

## 2023-05-19 NOTE — Telephone Encounter (Signed)
PA initiated via Covermymeds; KEY: [id number]. Awaiting determination.

## 2023-05-22 NOTE — Telephone Encounter (Signed)
PA approved.   PA Case: [id number], Status: Approved, Coverage Starts on: 06/13/2022 12:00:00 AM, Coverage Ends on: 06/12/2024 12:00:00 AM. Questions? Contact [phone].

## 2023-06-15 ENCOUNTER — Other Ambulatory Visit: Payer: Self-pay | Admitting: Cardiology

## 2023-06-15 ENCOUNTER — Encounter: Payer: Self-pay | Admitting: Family Medicine

## 2023-06-15 ENCOUNTER — Other Ambulatory Visit: Payer: Self-pay | Admitting: Family Medicine

## 2023-06-22 ENCOUNTER — Other Ambulatory Visit: Payer: Self-pay | Admitting: Family Medicine

## 2023-06-22 ENCOUNTER — Ambulatory Visit: Payer: Medicare HMO

## 2023-06-22 ENCOUNTER — Ambulatory Visit (HOSPITAL_BASED_OUTPATIENT_CLINIC_OR_DEPARTMENT_OTHER)
Admission: RE | Admit: 2023-06-22 | Discharge: 2023-06-22 | Disposition: A | Discharge status: Home / Self Care | Payer: Medicare HMO | Source: Ambulatory Visit | Attending: Family Medicine | Admitting: Family Medicine

## 2023-06-22 ENCOUNTER — Encounter: Payer: Self-pay | Admitting: Family Medicine

## 2023-06-22 ENCOUNTER — Ambulatory Visit (INDEPENDENT_AMBULATORY_CARE_PROVIDER_SITE_OTHER): Payer: Medicare HMO | Admitting: Family Medicine

## 2023-06-22 VITALS — BP 128/86 | HR 71 | Temp 98.3°F | Resp 16 | Ht 59.0 in | Wt 130.6 lb

## 2023-06-22 DIAGNOSIS — M25551 Pain in right hip: Secondary | ICD-10-CM | POA: Diagnosis not present

## 2023-06-22 DIAGNOSIS — I2510 Atherosclerotic heart disease of native coronary artery without angina pectoris: Secondary | ICD-10-CM

## 2023-06-22 DIAGNOSIS — Z0000 Encounter for general adult medical examination without abnormal findings: Secondary | ICD-10-CM

## 2023-06-22 DIAGNOSIS — M069 Rheumatoid arthritis, unspecified: Secondary | ICD-10-CM | POA: Diagnosis not present

## 2023-06-22 DIAGNOSIS — M25552 Pain in left hip: Secondary | ICD-10-CM | POA: Insufficient documentation

## 2023-06-22 DIAGNOSIS — M160 Bilateral primary osteoarthritis of hip: Secondary | ICD-10-CM | POA: Diagnosis not present

## 2023-06-22 DIAGNOSIS — M0580 Other rheumatoid arthritis with rheumatoid factor of unspecified site: Secondary | ICD-10-CM

## 2023-06-22 DIAGNOSIS — E785 Hyperlipidemia, unspecified: Secondary | ICD-10-CM | POA: Diagnosis not present

## 2023-06-22 NOTE — Progress Notes (Signed)
 Established Patient Office Visit  Subjective   Patient ID: Stephanie Lara, female    DOB: 08/14/1945  Age: 78 y.o. MRN: [id number]  Chief Complaint  Patient presents with   Annual Exam    Pt states not fasting     HPI Discussed the use of AI scribe software for clinical note transcription with the patient, who gave verbal consent to proceed.  History of Present Illness   The patient, with a history of palindromic rheumatism, presents for a physical after several years. She reports aching in her hips and back, suspecting arthritis as the cause. The discomfort is bilateral but more pronounced in the left hip. She also describes generalized body aches, similar to flu-like symptoms, which she believes indicate high inflammation levels. She is not currently on any medication for arthritis or rheumatism.  The patient also mentions she is in the process of discontinuing Pristiq , which has resulted in mild daily headaches. She has not sought any additional treatment for these headaches at this time.  In terms of lifestyle, the patient is active, attending [employer] aerobics and yoga classes twice a week, a senior line dancing class once a week, and walking when the weather permits.  The patient has previously attempted to see a rheumatologist but had to cancel the appointment due to family obligations. She is now interested in re-establishing care with a rheumatologist and would like to have a CRP test to assess inflammation levels. She also expresses interest in having x-rays of her hips to evaluate for arthritis.      Patient Active Problem List   Diagnosis Date Noted   Depression, major, single episode, moderate (HCC) 07/28/2022   Mid back pain 07/28/2022   Leukocytes in urine 07/28/2022   Statin myopathy 07/27/2022   Mitral regurgitation 04/07/2021   Myalgia due to statin 03/04/2021   Coronary artery calcification 03/01/2021   Preoperative cardiovascular examination 03/01/2021    Abnormal biliary HIDA scan 02/25/2021   Chicken pox 01/06/2021   Hyperlipidemia 01/06/2021   Chest pain 01/06/2021   RUQ pain 01/06/2021   Hot flashes 04/26/2019   Dizzy 06/21/2018   Other headache syndrome 06/21/2018   Upper respiratory tract infection 05/24/2018   Preventative health care 01/09/2017   Fatigue 11/11/2016   Primary insomnia 12/22/2015   Urinary incontinence 03/03/2015   Rheumatoid arthritis (HCC) 03/03/2015   Insomnia 03/03/2015   Globus pharyngeus 04/18/2013   Lipoma of neck 11/02/2011   Contact dermatitis due to plants, except food 10/20/2008   Past Medical History:  Diagnosis Date   Abnormal biliary HIDA scan 02/25/2021   Chest pain 01/06/2021   Chicken pox    Contact dermatitis due to plants, except food 10/20/2008   Overview:  Dermatitis Due To Contact With Plants   Coronary artery calcification 03/01/2021   Dizzy 06/21/2018   Fatigue 11/11/2016   Globus pharyngeus 04/18/2013   Hot flashes 04/26/2019   Hyperlipidemia    Insomnia 03/03/2015   Lipoma of neck 11/02/2011   Mitral regurgitation 04/07/2021   Myalgia due to statin 03/04/2021   Other headache syndrome 06/21/2018   Preoperative cardiovascular examination 03/01/2021   Preventative health care 01/09/2017   Primary insomnia 12/22/2015   Rheumatoid arthritis (HCC)    RUQ pain 01/06/2021   Upper respiratory tract infection 05/24/2018   Urinary incontinence 03/03/2015   Past Surgical History:  Procedure Laterality Date   ABDOMINOPLASTY     BREAST BIOPSY     CHOLECYSTECTOMY  03/08/2021   LIPOMA EXCISION  neck   PLACEMENT OF BREAST IMPLANTS     RHINOPLASTY     SEPTOPLASTY     Social History   Tobacco Use   Smoking status: Never   Smokeless tobacco: Never  Vaping Use   Vaping status: Never Used  Substance Use Topics   Alcohol use: Yes    Alcohol/week: 0.0 standard drinks of alcohol    Comment: Occ   Drug use: No   Social History   Socioeconomic History   Marital status: Married     Spouse name: Not on file   Number of children: Not on file   Years of education: Not on file   Highest education level: 12th grade  Occupational History   Occupation: retired nurse, children's-- [employer]  Tobacco Use   Smoking status: Never   Smokeless tobacco: Never  Vaping Use   Vaping status: Never Used  Substance and Sexual Activity   Alcohol use: Yes    Alcohol/week: 0.0 standard drinks of alcohol    Comment: Occ   Drug use: No   Sexual activity: Not Currently    Partners: Male  Other Topics Concern   Not on file  Social History Narrative   Exercise ---  2x a week to silver sneakers , 45 min yoga  2 days , senior line dancing    Social Drivers of Health   Financial Resource Strain: Low Risk  (06/21/2023)   Overall Financial Resource Strain (CARDIA)    Difficulty of Paying Living Expenses: Not hard at all  Food Insecurity: No Food Insecurity (06/21/2023)   Hunger Vital Sign    Worried About Running Out of Food in the Last Year: Never true    Ran Out of Food in the Last Year: Never true  Transportation Needs: No Transportation Needs (06/21/2023)   PRAPARE - Administrator, Civil Service (Medical): No    Lack of Transportation (Non-Medical): No  Physical Activity: Insufficiently Active (06/21/2023)   Exercise Vital Sign    Days of Exercise per Week: 3 days    Minutes of Exercise per Session: 30 min  Stress: No Stress Concern Present (06/21/2023)   [employer] of Occupational Health - Occupational Stress Questionnaire    Feeling of Stress : Not at all  Social Connections: Socially Integrated (06/21/2023)   Social Connection and Isolation Panel [NHANES]    Frequency of Communication with Friends and Family: More than three times a week    Frequency of Social Gatherings with Friends and Family: Once a week    Attends Religious Services: More than 4 times per year    Active Member of [employer] or Organizations: No    Attends Banker Meetings: 1 to 4 times  per year    Marital Status: Married  Catering Manager Violence: Unknown (09/15/2021)   Received from [employer], [facility]   HITS    Physically Hurt: Not on file    Insult or Talk Down To: Not on file    Threaten Physical Harm: Not on file    Scream or Curse: Not on file   Family Status  Relation Name Status   Mother  Deceased   Father  Deceased   Sister  Alive   Brother  Alive   PGM  (Not Specified)   Brother  (Not Specified)  No partnership data on file   Family History  Problem Relation Age of Onset   Hypertension Mother    Heart disease Father    Sudden death  Father    Heart attack Father    Gallbladder disease Father    Deep vein thrombosis Father    Diabetes Paternal Grandmother    Hypertension Brother    Allergies  Allergen Reactions   Definity  [Perflutren  Lipid Microsphere] Other (See Comments)    Severe abdominal Pain   Lipitor [Atorvastatin ]     myalgias   Pitavastatin      Myalgias    Pravastatin      myalgias      Review of Systems  Constitutional:  Negative for fever and malaise/fatigue.  HENT:  Negative for congestion.   Eyes:  Negative for blurred vision.  Respiratory:  Negative for cough and shortness of breath.   Cardiovascular:  Negative for chest pain, palpitations and leg swelling.  Gastrointestinal:  Negative for abdominal pain, blood in stool, nausea and vomiting.  Genitourinary:  Negative for dysuria and frequency.  Musculoskeletal:  Negative for back pain and falls.  Skin:  Negative for rash.  Neurological:  Negative for dizziness, loss of consciousness and headaches.  Endo/Heme/Allergies:  Negative for environmental allergies.  Psychiatric/Behavioral:  Negative for depression. The patient is not nervous/anxious.       Objective:     BP 128/86 (BP Location: Left Arm, Patient Position: Sitting, Cuff Size: Normal)   Pulse 71   Temp 98.3 F (36.8 C) (Oral)   Resp 16   Ht 4' 11 (1.499 m)   Wt 130 lb 9.6 oz (59.2 kg)   SpO2  100%   BMI 26.38 kg/m  BP Readings from Last 3 Encounters:  06/22/23 128/86  12/06/22 134/88  08/26/22 110/80   Wt Readings from Last 3 Encounters:  06/22/23 130 lb 9.6 oz (59.2 kg)  05/19/23 129 lb 6.4 oz (58.7 kg)  12/06/22 128 lb 6.4 oz (58.2 kg)   SpO2 Readings from Last 3 Encounters:  06/22/23 100%  12/06/22 98%  08/26/22 94%      Physical Exam Vitals and nursing note reviewed.  Constitutional:      General: She is not in acute distress.    Appearance: Normal appearance. She is well-developed.  HENT:     Head: Normocephalic and atraumatic.     Right Ear: Tympanic membrane, ear canal and external ear normal. There is no impacted cerumen.     Left Ear: Tympanic membrane, ear canal and external ear normal. There is no impacted cerumen.     Nose: Nose normal.     Mouth/Throat:     Mouth: Mucous membranes are moist.     Pharynx: Oropharynx is clear. No oropharyngeal exudate or posterior oropharyngeal erythema.  Eyes:     General: No scleral icterus.       Right eye: No discharge.        Left eye: No discharge.     Conjunctiva/sclera: Conjunctivae normal.     Pupils: Pupils are equal, round, and reactive to light.  Neck:     Thyroid : No thyromegaly or thyroid  tenderness.     Vascular: No JVD.  Cardiovascular:     Rate and Rhythm: Normal rate and regular rhythm.     Heart sounds: Normal heart sounds. No murmur heard. Pulmonary:     Effort: Pulmonary effort is normal. No respiratory distress.     Breath sounds: Normal breath sounds.  Abdominal:     General: Bowel sounds are normal. There is no distension.     Palpations: Abdomen is soft. There is no mass.     Tenderness: There is no abdominal tenderness.  There is no guarding or rebound.  Musculoskeletal:        General: Normal range of motion.     Cervical back: Normal range of motion and neck supple.     Right lower leg: No edema.     Left lower leg: No edema.  Lymphadenopathy:     Cervical: No cervical  adenopathy.  Skin:    General: Skin is warm and dry.     Findings: No erythema or rash.  Neurological:     Mental Status: She is alert and oriented to person, place, and time.     Cranial Nerves: No cranial nerve deficit.     Deep Tendon Reflexes: Reflexes are normal and symmetric.  Psychiatric:        Mood and Affect: Mood normal.        Behavior: Behavior normal.        Thought Content: Thought content normal.        Judgment: Judgment normal.     No results found for any visits on 06/22/23.  Last CBC Lab Results  Component Value Date   WBC 4.1 07/28/2022   HGB 13.2 07/28/2022   HCT 38.4 07/28/2022   MCV 93 07/28/2022   MCH 32.1 07/28/2022   RDW 11.6 (L) 07/28/2022   PLT 218 07/28/2022   Last metabolic panel Lab Results  Component Value Date   GLUCOSE 102 (H) 07/28/2022   NA 145 (H) 07/28/2022   K 4.4 07/28/2022   CL 108 (H) 07/28/2022   CO2 24 07/28/2022   BUN 27 07/28/2022   CREATININE 0.76 07/28/2022   EGFR 82 07/28/2022   CALCIUM  8.9 07/28/2022   PROT 6.1 07/28/2022   ALBUMIN 4.1 07/28/2022   BILITOT 0.6 07/28/2022   ALKPHOS 63 07/28/2022   AST 24 07/28/2022   ALT 22 07/28/2022   Last lipids Lab Results  Component Value Date   CHOL 174 07/28/2022   HDL 80 07/28/2022   LDLCALC 82 07/28/2022   TRIG 65 07/28/2022   CHOLHDL 2.2 07/28/2022   Last hemoglobin A1c Lab Results  Component Value Date   HGBA1C 5.2 07/28/2022   Last thyroid  functions Lab Results  Component Value Date   TSH 2.000 07/28/2022   Last vitamin D  Lab Results  Component Value Date   VD25OH 70.4 07/28/2022   Last vitamin B12 and Folate Lab Results  Component Value Date   VITAMINB12 568 07/28/2022   FOLATE >20.0 07/28/2022      The 10-year ASCVD risk score (Arnett DK, et al., 2019) is: 18%    Assessment & Plan:   Problem List Items Addressed This Visit       Unprioritized   Rheumatoid arthritis (HCC)   Relevant Orders   Ambulatory referral to Rheumatology    Hyperlipidemia   Relevant Orders   CBC with Differential/Platelet   Comprehensive metabolic panel   Lipid panel   Preventative health care - Primary   Ghm utd Check labs  See AVS Health Maintenance  Topic Date Due   Medicare Annual Wellness (AWV)  Never done   DTaP/Tdap/Td (1 - Tdap) Never done   COVID-19 Vaccine (3 - Moderna risk series) 10/04/2019   INFLUENZA VACCINE  09/11/2023 (Originally 01/12/2023)   Pneumonia Vaccine 8+ Years old  Completed   DEXA SCAN  Completed   Hepatitis C Screening  Completed   Zoster Vaccines- Shingrix  Completed   HPV VACCINES  Aged Out   Colonoscopy  Discontinued  Other Visit Diagnoses       Coronary artery disease involving native coronary artery of native heart without angina pectoris         Polyarthritis with positive rheumatoid factor (HCC)       Relevant Orders   TSH   VITAMIN D  25 Hydroxy (Vit-D Deficiency, Fractures)   Vitamin B12   CRP High sensitivity   Rheumatoid Factor   Antinuclear Antib (ANA)     Pain of right hip       Relevant Orders   DG HIP UNILAT W OR W/O PELVIS 2-3 VIEWS RIGHT     Pain of left hip       Relevant Orders   DG HIP UNILAT W OR W/O PELVIS 2-3 VIEWS LEFT     Assessment and Plan    Arthritis   She exhibits symptoms suggestive of high inflammation with significant aching in the hips and back, particularly the left hip, raising suspicions of arthritis. She is not currently on any medication for arthritis or palindromic rheumatism. We discussed the potential for a rheumatologist referral but decided to first confirm arthritis with x-rays of both hips. If arthritis is confirmed, a referral to a rheumatologist will follow. We will also order CRP and other inflammatory markers to assess inflammation levels.  Medication Management   She is experiencing mild daily headaches following discontinuation of Pristiq . We discussed the potential use of Prozac  to ease withdrawal symptoms should these headaches  worsen. For now, she prefers to manage symptoms without additional medication, with a plan to monitor headache symptoms and consider starting Prozac  if she worsens.  General Health Maintenance   She has not undergone a physical examination in several years despite engaging in regular exercise, including aerobics, yoga, and line dancing. She has not received recent tetanus or flu shots. We emphasized the importance of these vaccinations and the need for cholesterol testing. We will order a cholesterol test and administer both tetanus and flu shots at the pharmacy.  Follow-up   We will ensure previous records are available for the rheumatology referral and follow up with the rheumatologist after the x-rays and lab results are in.        Return if symptoms worsen or fail to improve.    Markos Theil R Lowne Chase, DO

## 2023-06-22 NOTE — Patient Instructions (Signed)
 Preventive Care 83 Years and Older, Female Preventive care refers to lifestyle choices and visits with your health care provider that can promote health and wellness. Preventive care visits are also called wellness exams. What can I expect for my preventive care visit? Counseling Your health care provider may ask you questions about your: Medical history, including: Past medical problems. Family medical history. Pregnancy and menstrual history. History of falls. Current health, including: Memory and ability to understand (cognition). Emotional well-being. Home life and relationship well-being. Sexual activity and sexual health. Lifestyle, including: Alcohol, nicotine or tobacco, and drug use. Access to firearms. Diet, exercise, and sleep habits. Work and work Astronomer. Sunscreen use. Safety issues such as seatbelt and bike helmet use. Physical exam Your health care provider will check your: Height and weight. These may be used to calculate your BMI (body mass index). BMI is a measurement that tells if you are at a healthy weight. Waist circumference. This measures the distance around your waistline. This measurement also tells if you are at a healthy weight and may help predict your risk of certain diseases, such as type 2 diabetes and high blood pressure. Heart rate and blood pressure. Body temperature. Skin for abnormal spots. What immunizations do I need?  Vaccines are usually given at various ages, according to a schedule. Your health care provider will recommend vaccines for you based on your age, medical history, and lifestyle or other factors, such as travel or where you work. What tests do I need? Screening Your health care provider may recommend screening tests for certain conditions. This may include: Lipid and cholesterol levels. Hepatitis C test. Hepatitis B test. HIV (human immunodeficiency virus) test. STI (sexually transmitted infection) testing, if you are at  risk. Lung cancer screening. Colorectal cancer screening. Diabetes screening. This is done by checking your blood sugar (glucose) after you have not eaten for a while (fasting). Mammogram. Talk with your health care provider about how often you should have regular mammograms. BRCA-related cancer screening. This may be done if you have a family history of breast, ovarian, tubal, or peritoneal cancers. Bone density scan. This is done to screen for osteoporosis. Talk with your health care provider about your test results, treatment options, and if necessary, the need for more tests. Follow these instructions at home: Eating and drinking  Eat a diet that includes fresh fruits and vegetables, whole grains, lean protein, and low-fat dairy products. Limit your intake of foods with high amounts of sugar, saturated fats, and salt. Take vitamin and mineral supplements as recommended by your health care provider. Do not drink alcohol if your health care provider tells you not to drink. If you drink alcohol: Limit how much you have to 0-1 drink a day. Know how much alcohol is in your drink. In the [place] one drink equals one 12 oz bottle of beer (355 mL), one 5 oz glass of wine (148 mL), or one 1 oz glass of hard liquor (44 mL). Lifestyle Brush your teeth every morning and night with fluoride toothpaste. Floss one time each day. Exercise for at least 30 minutes 5 or more days each week. Do not use any products that contain nicotine or tobacco. These products include cigarettes, chewing tobacco, and vaping devices, such as e-cigarettes. If you need help quitting, ask your health care provider. Do not use drugs. If you are sexually active, practice safe sex. Use a condom or other form of protection in order to prevent STIs. Take aspirin only as told by  your health care provider. Make sure that you understand how much to take and what form to take. Work with your health care provider to find out whether it  is safe and beneficial for you to take aspirin daily. Ask your health care provider if you need to take a cholesterol-lowering medicine (statin). Find healthy ways to manage stress, such as: Meditation, yoga, or listening to music. Journaling. Talking to a trusted person. Spending time with friends and family. Minimize exposure to UV radiation to reduce your risk of skin cancer. Safety Always wear your seat belt while driving or riding in a vehicle. Do not drive: If you have been drinking alcohol. Do not ride with someone who has been drinking. When you are tired or distracted. While texting. If you have been using any mind-altering substances or drugs. Wear a helmet and other protective equipment during sports activities. If you have firearms in your house, make sure you follow all gun safety procedures. What's next? Visit your health care provider once a year for an annual wellness visit. Ask your health care provider how often you should have your eyes and teeth checked. Stay up to date on all vaccines. This information is not intended to replace advice given to you by your health care provider. Make sure you discuss any questions you have with your health care provider. Document Revised: 11/25/2020 Document Reviewed: 11/25/2020 Elsevier Patient Education  2024 [employer].

## 2023-06-22 NOTE — Assessment & Plan Note (Signed)
 Ghm utd Check labs  See AVS Health Maintenance  Topic Date Due   Medicare Annual Wellness (AWV)  Never done   DTaP/Tdap/Td (1 - Tdap) Never done   COVID-19 Vaccine (3 - Moderna risk series) 10/04/2019   INFLUENZA VACCINE  09/11/2023 (Originally 01/12/2023)   Pneumonia Vaccine 17+ Years old  Completed   DEXA SCAN  Completed   Hepatitis C Screening  Completed   Zoster Vaccines- Shingrix  Completed   HPV VACCINES  Aged Out   Colonoscopy  Discontinued

## 2023-06-23 LAB — COMPREHENSIVE METABOLIC PANEL
ALT: 17 U/L (ref 0–35)
AST: 21 U/L (ref 0–37)
Albumin: 4.3 g/dL (ref 3.5–5.2)
Alkaline Phosphatase: 53 U/L (ref 39–117)
BUN: 24 mg/dL — ABNORMAL HIGH (ref 6–23)
CO2: 30 meq/L (ref 19–32)
Calcium: 9.3 mg/dL (ref 8.4–10.5)
Chloride: 105 meq/L (ref 96–112)
Creatinine, Ser: 0.62 mg/dL (ref 0.40–1.20)
GFR: 86.36 mL/min (ref >60.00)
Glucose, Bld: 94 mg/dL (ref 70–99)
Potassium: 3.8 meq/L (ref 3.5–5.1)
Sodium: 143 meq/L (ref 135–145)
Total Bilirubin: 0.7 mg/dL (ref 0.2–1.2)
Total Protein: 6.3 g/dL (ref 6.0–8.3)

## 2023-06-23 LAB — CBC WITH DIFFERENTIAL/PLATELET
Basophils Absolute: 0 10*3/uL (ref 0.0–0.1)
Basophils Relative: 0.5 % (ref 0.0–3.0)
Eosinophils Absolute: 0 10*3/uL (ref 0.0–0.7)
Eosinophils Relative: 0.9 % (ref 0.0–5.0)
HCT: 40 % (ref 36.0–46.0)
Hemoglobin: 13.5 g/dL (ref 12.0–15.0)
Lymphocytes Relative: 20 % (ref 12.0–46.0)
Lymphs Abs: 0.9 10*3/uL (ref 0.7–4.0)
MCHC: 33.7 g/dL (ref 30.0–36.0)
MCV: 97.4 fL (ref 78.0–100.0)
Monocytes Absolute: 0.4 10*3/uL (ref 0.1–1.0)
Monocytes Relative: 8 % (ref 3.0–12.0)
Neutro Abs: 3.1 10*3/uL (ref 1.4–7.7)
Neutrophils Relative %: 70.6 % (ref 43.0–77.0)
Platelets: 249 10*3/uL (ref 150.0–400.0)
RBC: 4.1 Mil/uL (ref 3.87–5.11)
RDW: 12.2 % (ref 11.5–15.5)
WBC: 4.4 10*3/uL (ref 4.0–10.5)

## 2023-06-23 LAB — LIPID PANEL
Cholesterol: 230 mg/dL — ABNORMAL HIGH (ref 0–200)
HDL: 75.7 mg/dL (ref >39.00)
LDL Cholesterol: 134 mg/dL — ABNORMAL HIGH (ref 0–99)
NonHDL: 154.77
Total CHOL/HDL Ratio: 3
Triglycerides: 105 mg/dL (ref 0.0–149.0)
VLDL: 21 mg/dL (ref 0.0–40.0)

## 2023-06-23 LAB — HIGH SENSITIVITY CRP: CRP, High Sensitivity: 2.46 mg/L (ref 0.000–5.000)

## 2023-06-23 LAB — VITAMIN D 25 HYDROXY (VIT D DEFICIENCY, FRACTURES): VITD: 49.68 ng/mL (ref 30.00–100.00)

## 2023-06-23 LAB — TSH: TSH: 1.41 u[IU]/mL (ref 0.35–5.50)

## 2023-06-23 LAB — VITAMIN B12: Vitamin B-12: 584 pg/mL (ref 211–911)

## 2023-06-27 LAB — ANA: Anti Nuclear Antibody (ANA): POSITIVE — AB

## 2023-06-27 LAB — ANTI-NUCLEAR AB-TITER (ANA TITER): ANA Titer 1: 1:40 {titer} — ABNORMAL HIGH

## 2023-06-27 LAB — RHEUMATOID FACTOR: Rheumatoid fact SerPl-aCnc: 45 [IU]/mL — ABNORMAL HIGH (ref <14)

## 2023-06-28 ENCOUNTER — Other Ambulatory Visit (HOSPITAL_COMMUNITY): Payer: Self-pay

## 2023-06-30 ENCOUNTER — Telehealth: Payer: Self-pay | Admitting: Pharmacy Technician

## 2023-06-30 ENCOUNTER — Other Ambulatory Visit (HOSPITAL_COMMUNITY): Payer: Self-pay

## 2023-06-30 NOTE — Telephone Encounter (Signed)
Pharmacy Patient Advocate Encounter   Received notification from Fax that prior authorization for repatha is required/requested.   Insurance verification completed.   The patient is insured through [employer] .   Per test claim: Refill too soon. PA is not needed at this time. Medication was filled 06/16/23. Next eligible fill date is 07/07/23.

## 2023-07-01 ENCOUNTER — Encounter: Payer: Self-pay | Admitting: Family Medicine

## 2023-07-02 ENCOUNTER — Encounter: Payer: Self-pay | Admitting: Family Medicine

## 2023-07-03 NOTE — Telephone Encounter (Signed)
See imaging results. Pt viewed via Mychart

## 2023-07-15 ENCOUNTER — Other Ambulatory Visit: Payer: Self-pay | Admitting: Cardiology

## 2023-07-24 ENCOUNTER — Encounter: Payer: Self-pay | Admitting: Family Medicine

## 2023-07-25 DIAGNOSIS — N952 Postmenopausal atrophic vaginitis: Secondary | ICD-10-CM | POA: Diagnosis not present

## 2023-07-25 DIAGNOSIS — Z01419 Encounter for gynecological examination (general) (routine) without abnormal findings: Secondary | ICD-10-CM | POA: Diagnosis not present

## 2023-07-25 DIAGNOSIS — N905 Atrophy of vulva: Secondary | ICD-10-CM | POA: Diagnosis not present

## 2023-07-25 DIAGNOSIS — N393 Stress incontinence (female) (male): Secondary | ICD-10-CM | POA: Diagnosis not present

## 2023-07-26 ENCOUNTER — Ambulatory Visit: Payer: Medicare HMO | Attending: Internal Medicine | Admitting: Internal Medicine

## 2023-07-26 ENCOUNTER — Encounter: Payer: Self-pay | Admitting: Internal Medicine

## 2023-07-26 VITALS — BP 138/83 | HR 72 | Resp 14 | Ht 60.0 in | Wt 126.4 lb

## 2023-07-26 DIAGNOSIS — M0579 Rheumatoid arthritis with rheumatoid factor of multiple sites without organ or systems involvement: Secondary | ICD-10-CM

## 2023-07-26 DIAGNOSIS — M159 Polyosteoarthritis, unspecified: Secondary | ICD-10-CM | POA: Diagnosis not present

## 2023-07-26 DIAGNOSIS — R768 Other specified abnormal immunological findings in serum: Secondary | ICD-10-CM

## 2023-07-26 NOTE — Progress Notes (Signed)
Office Visit Note  Patient: Stephanie Lara             Date of Birth: [birth year]           MRN: [id number]             PCP: Donato Schultz, DO Referring: Donato Schultz, * Visit Date: 07/26/2023  Subjective:   Discussed the use of AI scribe software for clinical note transcription with the patient, who gave verbal consent to proceed.  History of Present Illness   Stephanie Lara is a 78 year old female with history of rheumatoid arthritis who presents with generalized body aches and malaise.  She has a history of rheumatoid arthritis, initially diagnosed as palindromic rheumatism in 2003. Her symptoms have been episodic, with bouts lasting 24 hours and resolving for months or years. Initially, the pain was localized to her wrists, elbows, and hips, but it has evolved into a generalized achiness resembling flu-like symptoms without fever. Transition was apparently gradual not exactly clear when. This current episode of widespread achiness has persisted for about a month. No visible joint swelling, rashes, or significant morning stiffness. Generally feels well upon waking, with achiness starting about 30 minutes after getting up. No specific pattern related to weather or activity level, although she continues to exercise through the discomfort.  She takes 500 mg of Tylenol, which does not alleviate her symptoms. Previously tried meloxicam but not recommended due to CV disease, and was prescribed methotrexate, which she discontinued due to concerns about side effects. She has a high positive CCP antibody for rheumatoid arthritis since at least 2016 but has not been on long-term daily medication for RA.  She has attempted to manage her condition through diet, anti-inflammatory supplements, and exercise, including senior aerobics, yoga, and dance classes. However, she has not participated in some of these activities for about a month due to her symptoms.  Her past medical history  includes coronary artery disease, which she attributes to a family history of heart disease. Her father died of heart disease at 59, and all her brothers have heart disease.  No fever, joint swelling, rashes, dry eyes, dry mouth, lymph node swelling, or carpal tunnel syndrome. Reports difficulty sleeping recently due to discontinuation of pristiq.  Labs reviewed 06/2023 ANA 1:40 homogenous RF 45 CRP 2.46 CBC wnl CMP wnl  Activities of Daily Living:  Patient reports morning stiffness for 30 minutes.   Patient Reports nocturnal pain.  Difficulty dressing/grooming: Denies Difficulty climbing stairs: Reports Difficulty getting out of chair: Denies Difficulty using hands for taps, buttons, cutlery, and/or writing: Denies  Review of Systems  Constitutional:  Positive for fatigue.  HENT:  Positive for nose dryness. Negative for mouth sores and mouth dryness.   Eyes:  Negative for pain and dryness.  Respiratory:  Negative for shortness of breath and difficulty breathing.   Cardiovascular:  Negative for chest pain and palpitations.  Gastrointestinal:  Negative for blood in stool, constipation and diarrhea.  Endocrine: Negative for increased urination.  Genitourinary:  Negative for involuntary urination.  Musculoskeletal:  Positive for joint pain, joint pain, myalgias, muscle weakness, morning stiffness and myalgias. Negative for gait problem, joint swelling and muscle tenderness.  Skin:  Negative for color change, rash, hair loss and sensitivity to sunlight.  Allergic/Immunologic: Negative for susceptible to infections.  Neurological:  Positive for headaches. Negative for dizziness.  Hematological:  Negative for swollen glands.  Psychiatric/Behavioral:  Positive for depressed mood and sleep disturbance. The patient  is not nervous/anxious.     PMFS History:  Patient Active Problem List   Diagnosis Date Noted   Depression, major, single episode, moderate (HCC) 07/28/2022   Mid back pain  07/28/2022   Leukocytes in urine 07/28/2022   Statin myopathy 07/27/2022   Mitral regurgitation 04/07/2021   Myalgia due to statin 03/04/2021   Coronary artery calcification 03/01/2021   Preoperative cardiovascular examination 03/01/2021   Abnormal biliary HIDA scan 02/25/2021   Chicken pox 01/06/2021   Hyperlipidemia 01/06/2021   Chest pain 01/06/2021   RUQ pain 01/06/2021   Hot flashes 04/26/2019   Dizzy 06/21/2018   Other headache syndrome 06/21/2018   Upper respiratory tract infection 05/24/2018   Preventative health care 01/09/2017   Fatigue 11/11/2016   Primary insomnia 12/22/2015   Urinary incontinence 03/03/2015   Rheumatoid arthritis (HCC) 03/03/2015   Insomnia 03/03/2015   Globus pharyngeus 04/18/2013   Lipoma of neck 11/02/2011   Contact dermatitis due to plants, except food 10/20/2008    Past Medical History:  Diagnosis Date   Abnormal biliary HIDA scan 02/25/2021   Chest pain 01/06/2021   Chicken pox    Contact dermatitis due to plants, except food 10/20/2008   Overview:  Dermatitis Due To Contact With Plants   Coronary artery calcification 03/01/2021   Dizzy 06/21/2018   Fatigue 11/11/2016   Globus pharyngeus 04/18/2013   Hot flashes 04/26/2019   Hyperlipidemia    Insomnia 03/03/2015   Lipoma of neck 11/02/2011   Mitral regurgitation 04/07/2021   Myalgia due to statin 03/04/2021   Other headache syndrome 06/21/2018   Preoperative cardiovascular examination 03/01/2021   Preventative health care 01/09/2017   Primary insomnia 12/22/2015   Rheumatoid arthritis (HCC)    RUQ pain 01/06/2021   Upper respiratory tract infection 05/24/2018   Urinary incontinence 03/03/2015    Family History  Problem Relation Age of Onset   Pancreatic cancer Mother    Diabetes Mother    Hypertension Father    Heart disease Father    Sudden death Father    Heart attack Father    Gallbladder disease Father    Deep vein thrombosis Father    Heart disease Brother    Heart disease Brother     Diabetes Brother    Diabetes Brother    Heart disease Brother    Colon cancer Brother    Lung cancer Brother    Diabetes Paternal Grandmother    Healthy Son    Healthy Son    Healthy Daughter    Past Surgical History:  Procedure Laterality Date   ABDOMINOPLASTY     BREAST BIOPSY     CHOLECYSTECTOMY  03/08/2021   LIPOMA EXCISION     neck   PLACEMENT OF BREAST IMPLANTS     RHINOPLASTY     SEPTOPLASTY     Social History   Social History Narrative   Exercise ---  2x a week to silver sneakers , 45 min yoga  2 days , senior line dancing    Immunization History  Administered Date(s) Administered   Fluad Quad(high Dose 65+) 04/04/2019, 04/06/2020, 04/07/2021   Influenza,inj,Quad PF,6+ Mos 03/03/2015   Influenza-Unspecified 04/13/2014, 03/16/2017   Moderna Sars-Covid-2 Vaccination 07/15/2019, 09/06/2019   Pneumococcal Conjugate-13 09/10/2015   Pneumococcal Polysaccharide-23 03/16/2017   Pneumococcal-Unspecified 06/13/2013   Zoster Recombinant(Shingrix) 05/20/2017, 10/02/2017   Zoster, Live 11/19/2010, 06/14/2011     Objective: Vital Signs: BP 138/83 (BP Location: Right Arm, Patient Position: Sitting, Cuff Size: Normal)   Pulse 72  Resp 14   Ht 5' (1.524 m)   Wt 126 lb 6.4 oz (57.3 kg)   BMI 24.69 kg/m    Physical Exam HENT:     Mouth/Throat:     Mouth: Mucous membranes are moist.     Pharynx: Oropharynx is clear.  Eyes:     Conjunctiva/sclera: Conjunctivae normal.  Cardiovascular:     Rate and Rhythm: Normal rate and regular rhythm.  Pulmonary:     Effort: Pulmonary effort is normal.     Breath sounds: Normal breath sounds.  Musculoskeletal:     Right lower leg: No edema.     Left lower leg: No edema.  Lymphadenopathy:     Cervical: No cervical adenopathy.  Skin:    General: Skin is warm and dry.     Findings: No rash.  Neurological:     Mental Status: She is alert.  Psychiatric:        Mood and Affect: Mood normal.     Musculoskeletal Exam:   Shoulders full ROM no tenderness or swelling Elbows full ROM no tenderness or swelling Wrists full ROM no tenderness or swelling Fingers full ROM no tenderness or swelling Small area of tenderness on right side at thoracic spine, around base of scapula Knees full ROM no tenderness or swelling Ankles full ROM no tenderness or swelling MTPs full ROM no tenderness or swelling   Investigation: No additional findings.  Imaging: No results found.  Recent Labs: Lab Results  Component Value Date   WBC 4.4 06/22/2023   HGB 13.5 06/22/2023   PLT 249.0 06/22/2023   NA 143 06/22/2023   K 3.8 06/22/2023   CL 105 06/22/2023   CO2 30 06/22/2023   GLUCOSE 94 06/22/2023   BUN 24 (H) 06/22/2023   CREATININE 0.62 06/22/2023   BILITOT 0.7 06/22/2023   ALKPHOS 53 06/22/2023   AST 21 06/22/2023   ALT 17 06/22/2023   PROT 6.3 06/22/2023   ALBUMIN 4.3 06/22/2023   CALCIUM 9.3 06/22/2023    Speciality Comments: No specialty comments available.  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 Current symptoms are more generalized and not limited to specific joints. No visible joint swelling or rashes. Patient is not currently on any disease-modifying antirheumatic drugs (DMARDs) and has a preference for non-pharmacological interventions. -Order blood tests to assess current inflammation levels (C-reactive protein, complements, immunoglobulins). -Consider starting hydroxychloroquine if blood tests indicate active inflammation. Discuss potential side effects and need for regular ophthalmology exams. -If blood tests do not  indicate active inflammation, consider a trial of low-dose steroids to assess response. -Continue current regimen of anti-inflammatory supplements (fish oil, turmeric).  Positive ANA (antinuclear antibody) - Plan: C3 and C4, Anti-DNA antibody, double-stranded Checking complement and dsDNA to rule out but very low clinical suspicion for this being significant in addition to much higher positive RA serology  Osteoarthritis Mild osteoarthritis changes noted in hips on previous x-ray. Patient experiences some discomfort in back and hips. Provided handout on supplemental treatments (turmeric, omega-3) as well -Continue current exercise regimen (aerobics, yoga, dance class, walking). -Consider physical therapy for increased kyphosis and associated muscle pain if any worsening.  Coronary Artery Disease Family and personal history of CAD. No personal  history of smoking. On long term ASA 81 mg.   Orders: Orders Placed This Encounter  Procedures   Sedimentation rate   C-reactive protein   IgG, IgA, IgM   C3 and C4   Anti-DNA antibody, double-stranded   No orders of the defined types were placed in this encounter.    Follow-Up Instructions: Return in about 2 months (around 09/23/2023) for New pt RA ?HCQ start f/u 2mos.   Fuller Plan, MD  Note - This record has been created using [employer].  Chart creation errors have been sought, but may not always  have been located. Such creation errors do not reflect on  the standard of medical care.

## 2023-07-27 LAB — IGG, IGA, IGM
IgG (Immunoglobin G), Serum: 850 mg/dL (ref 600–1540)
IgM, Serum: 162 mg/dL (ref 50–300)
Immunoglobulin A: 128 mg/dL (ref 70–320)

## 2023-07-27 LAB — C3 AND C4
C3 Complement: 130 mg/dL (ref 83–193)
C4 Complement: 18 mg/dL (ref 15–57)

## 2023-07-27 LAB — C-REACTIVE PROTEIN: CRP: <3 mg/L (ref <8.0)

## 2023-07-27 LAB — SEDIMENTATION RATE: Sed Rate: 11 mm/h (ref 0–30)

## 2023-07-27 LAB — ANTI-DNA ANTIBODY, DOUBLE-STRANDED: ds DNA Ab: <1 [IU]/mL

## 2023-07-27 NOTE — Telephone Encounter (Signed)
Are able to send her referral to somewhere else?

## 2023-07-31 ENCOUNTER — Encounter: Payer: Self-pay | Admitting: Cardiology

## 2023-08-02 ENCOUNTER — Ambulatory Visit: Payer: Medicare HMO | Admitting: Cardiology

## 2023-08-02 DIAGNOSIS — H52223 Regular astigmatism, bilateral: Secondary | ICD-10-CM | POA: Diagnosis not present

## 2023-08-02 DIAGNOSIS — H25813 Combined forms of age-related cataract, bilateral: Secondary | ICD-10-CM | POA: Diagnosis not present

## 2023-08-02 DIAGNOSIS — H35371 Puckering of macula, right eye: Secondary | ICD-10-CM | POA: Diagnosis not present

## 2023-08-05 ENCOUNTER — Other Ambulatory Visit: Payer: Self-pay | Admitting: Cardiology

## 2023-08-08 DIAGNOSIS — H25813 Combined forms of age-related cataract, bilateral: Secondary | ICD-10-CM | POA: Diagnosis not present

## 2023-08-10 DIAGNOSIS — R140 Abdominal distension (gaseous): Secondary | ICD-10-CM | POA: Diagnosis not present

## 2023-08-10 DIAGNOSIS — K5900 Constipation, unspecified: Secondary | ICD-10-CM | POA: Diagnosis not present

## 2023-08-10 DIAGNOSIS — R1013 Epigastric pain: Secondary | ICD-10-CM | POA: Diagnosis not present

## 2023-08-15 DIAGNOSIS — H25811 Combined forms of age-related cataract, right eye: Secondary | ICD-10-CM | POA: Diagnosis not present

## 2023-08-15 DIAGNOSIS — H52223 Regular astigmatism, bilateral: Secondary | ICD-10-CM | POA: Diagnosis not present

## 2023-08-15 DIAGNOSIS — H526 Other disorders of refraction: Secondary | ICD-10-CM | POA: Diagnosis not present

## 2023-08-15 DIAGNOSIS — H35371 Puckering of macula, right eye: Secondary | ICD-10-CM | POA: Diagnosis not present

## 2023-08-22 DIAGNOSIS — Z79899 Other long term (current) drug therapy: Secondary | ICD-10-CM | POA: Diagnosis not present

## 2023-08-22 DIAGNOSIS — H25812 Combined forms of age-related cataract, left eye: Secondary | ICD-10-CM | POA: Diagnosis not present

## 2023-08-22 DIAGNOSIS — F419 Anxiety disorder, unspecified: Secondary | ICD-10-CM | POA: Diagnosis not present

## 2023-08-22 DIAGNOSIS — F32A Depression, unspecified: Secondary | ICD-10-CM | POA: Diagnosis not present

## 2023-08-26 ENCOUNTER — Other Ambulatory Visit: Payer: Self-pay | Admitting: Cardiology

## 2023-08-28 ENCOUNTER — Encounter: Payer: Self-pay | Admitting: Family Medicine

## 2023-08-28 ENCOUNTER — Ambulatory Visit (INDEPENDENT_AMBULATORY_CARE_PROVIDER_SITE_OTHER): Admitting: Family Medicine

## 2023-08-28 VITALS — BP 129/72 | HR 65 | Temp 98.6°F | Resp 16 | Ht 60.0 in | Wt 128.2 lb

## 2023-08-28 DIAGNOSIS — M0580 Other rheumatoid arthritis with rheumatoid factor of unspecified site: Secondary | ICD-10-CM | POA: Diagnosis not present

## 2023-08-28 DIAGNOSIS — Z8619 Personal history of other infectious and parasitic diseases: Secondary | ICD-10-CM

## 2023-08-28 NOTE — Telephone Encounter (Signed)
 Rx refill sent to pharmacy.

## 2023-08-28 NOTE — Patient Instructions (Signed)
 Fatigue If you have fatigue, you feel tired all the time and have a lack of energy or a lack of motivation. Fatigue may make it difficult to start or complete tasks because of exhaustion. Occasional or mild fatigue is often a normal response to activity or life. However, long-term (chronic) or extreme fatigue may be a symptom of a medical condition such as: Depression. Not having enough red blood cells or hemoglobin in the blood (anemia). A problem with a small gland located in the lower front part of the neck (thyroid disorder). Rheumatologic conditions. These are problems related to the body's defense system (immune system). Infections, especially certain viral infections. Fatigue can also lead to negative health outcomes over time. Follow these instructions at home: Medicines Take over-the-counter and prescription medicines only as told by your health care provider. Take a multivitamin if told by your health care provider. Do not use herbal or dietary supplements unless they are approved by your health care provider. Eating and drinking  Avoid heavy meals in the evening. Eat a well-balanced diet, which includes lean proteins, whole grains, plenty of fruits and vegetables, and low-fat dairy products. Avoid eating or drinking too many products with caffeine in them. Avoid alcohol. Drink enough fluid to keep your urine pale yellow. Activity  Exercise regularly, as told by your health care provider. Use or practice techniques to help you relax, such as yoga, tai chi, meditation, or massage therapy. Lifestyle Change situations that cause you stress. Try to keep your work and personal schedules in balance. Do not use recreational or illegal drugs. General instructions Monitor your fatigue for any changes. Go to bed and get up at the same time every day. Avoid fatigue by pacing yourself during the day and getting enough sleep at night. Maintain a healthy weight. Contact a health care  provider if: Your fatigue does not get better. You have a fever. You suddenly lose or gain weight. You have headaches. You have trouble falling asleep or sleeping through the night. You feel angry, guilty, anxious, or sad. You have swelling in your legs or another part of your body. Get help right away if: You feel confused, feel like you might faint, or faint. Your vision is blurry or you have a severe headache. You have severe pain in your abdomen, your back, or the area between your waist and hips (pelvis). You have chest pain, shortness of breath, or an irregular or fast heartbeat. You are unable to urinate, or you urinate less than normal. You have abnormal bleeding from the rectum, nose, lungs, nipples, or, if you are female, the vagina. You vomit blood. You have thoughts about hurting yourself or others. These symptoms may be an emergency. Get help right away. Call 911. Do not wait to see if the symptoms will go away. Do not drive yourself to the hospital. Get help right away if you feel like you may hurt yourself or others, or have thoughts about taking your own life. Go to your nearest emergency room or: Call 911. Call the National Suicide Prevention Lifeline at (262)721-8699 or 988. This is open 24 hours a day. Text the Crisis Text Line at 8450584327. Summary If you have fatigue, you feel tired all the time and have a lack of energy or a lack of motivation. Fatigue may make it difficult to start or complete tasks because of exhaustion. Long-term (chronic) or extreme fatigue may be a symptom of a medical condition. Exercise regularly, as told by your health care provider.  Change situations that cause you stress. Try to keep your work and personal schedules in balance. This information is not intended to replace advice given to you by your health care provider. Make sure you discuss any questions you have with your health care provider. Document Revised: 03/22/2021 Document  Reviewed: 03/22/2021 Elsevier Patient Education  2024 [employer].

## 2023-08-28 NOTE — Progress Notes (Signed)
 Established Patient Office Visit  Subjective   Patient ID: Stephanie Lara, female    DOB: 01/28/1946  Age: 78 y.o. MRN: [id number]  Chief Complaint  Patient presents with   Fatigue    Patient complains of fatigue since January   Pain    Patient complains of feeling achy throughout Since January    Headache    Complains of headaches on and off for a few months     HPI Discussed the use of AI scribe software for clinical note transcription with the patient, who gave verbal consent to proceed.  History of Present Illness   Stephanie Lara is a 78 year old female who presents with chronic body aches and fatigue.  She has been experiencing chronic body aches and fatigue since 2009. The symptoms are described as flu-like without fever or chills, with generalized aches, significant fatigue, and sensitivity to noise. These symptoms have been persistent and have impacted her daily life.  She was previously referred to a rheumatologist who found elevated antibodies, but no specific diagnosis was made. She recalls being diagnosed with Lyme disease at a clinic associated with her workplace, despite not recalling a tick bite. She was treated with antibiotics for a few weeks, but details of the diagnosis and treatment are unclear to her. Her ANA levels have been equivocal, with a titer of 1:40, which has been consistent over the past year.  She has a history of headaches, which she attributes to tension from not feeling well. She previously experienced difficulty discontinuing Pristiq, which was associated with headaches, but she is no longer taking it.  She reports a history of stomach issues, previously treated at an integrative health clinic where she was treated for a tapeworm, although she felt worse with the treatment.  Her vitamin D levels were noted to be low normal, and she currently takes 500 to 1000 mg of vitamin D daily. Her B12 levels are reported to be good.  No fever or chills.  Generalized body aches, fatigue, and sensitivity to noise. Headaches, which she attributes to tension.      Patient Active Problem List   Diagnosis Date Noted   Depression, major, single episode, moderate (HCC) 07/28/2022   Mid back pain 07/28/2022   Leukocytes in urine 07/28/2022   Statin myopathy 07/27/2022   Mitral regurgitation 04/07/2021   Myalgia due to statin 03/04/2021   Coronary artery calcification 03/01/2021   Preoperative cardiovascular examination 03/01/2021   Abnormal biliary HIDA scan 02/25/2021   Chicken pox 01/06/2021   Hyperlipidemia 01/06/2021   Chest pain 01/06/2021   RUQ pain 01/06/2021   Hot flashes 04/26/2019   Dizzy 06/21/2018   Other headache syndrome 06/21/2018   Upper respiratory tract infection 05/24/2018   Preventative health care 01/09/2017   Fatigue 11/11/2016   Primary insomnia 12/22/2015   Urinary incontinence 03/03/2015   Rheumatoid arthritis (HCC) 03/03/2015   Insomnia 03/03/2015   Globus pharyngeus 04/18/2013   Lipoma of neck 11/02/2011   Contact dermatitis due to plants, except food 10/20/2008   Past Medical History:  Diagnosis Date   Abnormal biliary HIDA scan 02/25/2021   Chest pain 01/06/2021   Chicken pox    Contact dermatitis due to plants, except food 10/20/2008   Overview:  Dermatitis Due To Contact With Plants   Coronary artery calcification 03/01/2021   Depression, major, single episode, moderate (HCC) 07/28/2022   Dizzy 06/21/2018   Fatigue 11/11/2016   Globus pharyngeus 04/18/2013   Hot flashes 04/26/2019   Hyperlipidemia  Insomnia 03/03/2015   Leukocytes in urine 07/28/2022   Lipoma of neck 11/02/2011   Mid back pain 07/28/2022   Mitral regurgitation 04/07/2021   Myalgia due to statin 03/04/2021   Other headache syndrome 06/21/2018   Preoperative cardiovascular examination 03/01/2021   Preventative health care 01/09/2017   Primary insomnia 12/22/2015   Rheumatoid arthritis (HCC)    RUQ pain 01/06/2021    Statin myopathy 07/27/2022   Upper respiratory tract infection 05/24/2018   Urinary incontinence 03/03/2015   Past Surgical History:  Procedure Laterality Date   ABDOMINOPLASTY     BREAST BIOPSY     CHOLECYSTECTOMY  03/08/2021   LIPOMA EXCISION     neck   PLACEMENT OF BREAST IMPLANTS     RHINOPLASTY     SEPTOPLASTY     Social History   Tobacco Use   Smoking status: Never    Passive exposure: Past   Smokeless tobacco: Never  Vaping Use   Vaping status: Never Used  Substance Use Topics   Alcohol use: Yes    Alcohol/week: 0.0 standard drinks of alcohol    Comment: Occ   Drug use: No   Social History   Socioeconomic History   Marital status: Married    Spouse name: Not on file   Number of children: Not on file   Years of education: Not on file   Highest education level: 12th grade  Occupational History   Occupation: retired Nurse, children's-- [employer]  Tobacco Use   Smoking status: Never    Passive exposure: Past   Smokeless tobacco: Never  Vaping Use   Vaping status: Never Used  Substance and Sexual Activity   Alcohol use: Yes    Alcohol/week: 0.0 standard drinks of alcohol    Comment: Occ   Drug use: No   Sexual activity: Not Currently    Partners: Male  Other Topics Concern   Not on file  Social History Narrative   Exercise ---  2x a week to silver sneakers , 45 min yoga  2 days , senior line dancing    Social Drivers of Health   Financial Resource Strain: Low Risk  (06/21/2023)   Overall Financial Resource Strain (CARDIA)    Difficulty of Paying Living Expenses: Not hard at all  Food Insecurity: No Food Insecurity (06/21/2023)   Hunger Vital Sign    Worried About Running Out of Food in the Last Year: Never true    Ran Out of Food in the Last Year: Never true  Transportation Needs: No Transportation Needs (06/21/2023)   PRAPARE - Administrator, Civil Service (Medical): No    Lack of Transportation (Non-Medical): No  Physical Activity:  Insufficiently Active (06/21/2023)   Exercise Vital Sign    Days of Exercise per Week: 3 days    Minutes of Exercise per Session: 30 min  Stress: No Stress Concern Present (06/21/2023)   [employer] of Occupational Health - Occupational Stress Questionnaire    Feeling of Stress : Not at all  Social Connections: Socially Integrated (06/21/2023)   Social Connection and Isolation Panel [NHANES]    Frequency of Communication with Friends and Family: More than three times a week    Frequency of Social Gatherings with Friends and Family: Once a week    Attends Religious Services: More than 4 times per year    Active Member of [employer] or Organizations: No    Attends Banker Meetings: 1 to 4 times per year  Marital Status: Married  Catering manager Violence: Unknown (09/15/2021)   Received from [employer], [facility]   HITS    Physically Hurt: Not on file    Insult or Talk Down To: Not on file    Threaten Physical Harm: Not on file    Scream or Curse: Not on file   Family Status  Relation Name Status   Mother  Deceased   Father  Deceased   Sister 2 Deceased       born without bile ducts   Sister  Alive   Brother  Alive   Brother  Alive   Brother  Alive   Brother  Deceased   PGM  (Not Specified)   Son  Alive   Son  Alive   Daughter  Alive  No partnership data on file   Family History  Problem Relation Age of Onset   Pancreatic cancer Mother    Diabetes Mother    Hypertension Father    Heart disease Father    Sudden death Father    Heart attack Father    Gallbladder disease Father    Deep vein thrombosis Father    Heart disease Brother    Heart disease Brother    Diabetes Brother    Diabetes Brother    Heart disease Brother    Colon cancer Brother    Lung cancer Brother    Diabetes Paternal Grandmother    Healthy Son    Healthy Son    Healthy Daughter    Allergies  Allergen Reactions   Definity [Perflutren Lipid Microsphere] Other (See Comments)     Severe abdominal Pain   Lipitor [Atorvastatin]     myalgias   Pitavastatin     Myalgias    Pravastatin     myalgias      Review of Systems  Constitutional:  Positive for malaise/fatigue. Negative for fever.  HENT:  Negative for congestion.   Eyes:  Negative for blurred vision.  Respiratory:  Negative for cough and shortness of breath.   Cardiovascular:  Negative for chest pain, palpitations and leg swelling.  Gastrointestinal:  Negative for abdominal pain, blood in stool, nausea and vomiting.  Genitourinary:  Negative for dysuria and frequency.  Musculoskeletal:  Negative for back pain and falls.  Skin:  Negative for rash.  Neurological:  Positive for headaches. Negative for dizziness and loss of consciousness.  Endo/Heme/Allergies:  Negative for environmental allergies.  Psychiatric/Behavioral:  Negative for depression. The patient is not nervous/anxious.       Objective:     BP 129/72 (BP Location: Right Arm, Patient Position: Sitting, Cuff Size: Small)   Pulse 65   Temp 98.6 F (37 C) (Oral)   Resp 16   Ht 5' (1.524 m)   Wt 128 lb 3.2 oz (58.2 kg)   SpO2 100%   BMI 25.04 kg/m  BP Readings from Last 3 Encounters:  08/28/23 129/72  07/26/23 138/83  06/22/23 128/86   Wt Readings from Last 3 Encounters:  08/28/23 128 lb 3.2 oz (58.2 kg)  07/26/23 126 lb 6.4 oz (57.3 kg)  06/22/23 130 lb 9.6 oz (59.2 kg)   SpO2 Readings from Last 3 Encounters:  08/28/23 100%  06/22/23 100%  12/06/22 98%    Physical Exam Vitals and nursing note reviewed.  Constitutional:      General: She is not in acute distress.    Appearance: Normal appearance. She is well-developed.  HENT:     Head: Normocephalic and atraumatic.  Eyes:     General: No scleral icterus.       Right eye: No discharge.        Left eye: No discharge.  Cardiovascular:     Rate and Rhythm: Normal rate and regular rhythm.     Heart sounds: No murmur heard. Pulmonary:     Effort: Pulmonary effort is  normal. No respiratory distress.     Breath sounds: Normal breath sounds.  Musculoskeletal:        General: Normal range of motion.     Cervical back: Normal range of motion and neck supple.     Right lower leg: No edema.     Left lower leg: No edema.  Skin:    General: Skin is warm and dry.  Neurological:     Mental Status: She is alert and oriented to person, place, and time.  Psychiatric:        Mood and Affect: Mood normal.        Behavior: Behavior normal.        Thought Content: Thought content normal.        Judgment: Judgment normal.      No results found for any visits on 08/28/23.  Last CBC Lab Results  Component Value Date   WBC 4.4 06/22/2023   HGB 13.5 06/22/2023   HCT 40.0 06/22/2023   MCV 97.4 06/22/2023   MCH 32.1 07/28/2022   RDW 12.2 06/22/2023   PLT 249.0 06/22/2023   Last metabolic panel Lab Results  Component Value Date   GLUCOSE 94 06/22/2023   NA 143 06/22/2023   K 3.8 06/22/2023   CL 105 06/22/2023   CO2 30 06/22/2023   BUN 24 (H) 06/22/2023   CREATININE 0.62 06/22/2023   GFR 86.36 06/22/2023   CALCIUM 9.3 06/22/2023   PROT 6.3 06/22/2023   ALBUMIN 4.3 06/22/2023   BILITOT 0.7 06/22/2023   ALKPHOS 53 06/22/2023   AST 21 06/22/2023   ALT 17 06/22/2023   Last lipids Lab Results  Component Value Date   CHOL 230 (H) 06/22/2023   HDL 75.70 06/22/2023   LDLCALC 134 (H) 06/22/2023   TRIG 105.0 06/22/2023   CHOLHDL 3 06/22/2023   Last hemoglobin A1c Lab Results  Component Value Date   HGBA1C 5.2 07/28/2022   Last thyroid functions Lab Results  Component Value Date   TSH 1.41 06/22/2023   Last vitamin D Lab Results  Component Value Date   VD25OH 49.68 06/22/2023   Last vitamin B12 and Folate Lab Results  Component Value Date   VITAMINB12 584 06/22/2023   FOLATE >20.0 07/28/2022      The 10-year ASCVD risk score (Arnett DK, et al., 2019) is: 18.5%    Assessment & Plan:   Problem List Items Addressed This Visit    None Visit Diagnoses       Polyarthritis with positive rheumatoid factor (HCC)    -  Primary   Relevant Orders   Lyme Disease Serology w/Reflex   Rocky mtn spotted fvr abs pnl(IgG+IgM)   Ehrlichia antibody panel   Alpha-Gal Panel     History of Lyme disease       Relevant Orders   Lyme Disease Serology w/Reflex   Rocky mtn spotted fvr abs pnl(IgG+IgM)   Ehrlichia antibody panel   Alpha-Gal Panel     Assessment and Plan    Chronic Fatigue and Body Aches   Chronic fatigue and body aches have persisted since 2009, resembling flu symptoms without fever  or chills. Previous rheumatology evaluation revealed high antibodies but no definitive diagnosis. Lyme disease was diagnosed based on high antibodies, despite no history of tick bites. Current symptoms include noise sensitivity and persistent fatigue. Differential diagnosis includes chronic Lyme disease, fibromyalgia, or other autoimmune conditions. Extensive testing has been inconclusive. Chronic Lyme disease treatments are often out-of-pocket as insurance may not cover them. Consider Lyme disease titer to assess current status. Refer to integrative medicine specialist if symptoms persist without diagnosis.  Vitamin D Insufficiency   Low normal vitamin D levels were noted in January. Increasing vitamin D intake may help alleviate fatigue and body aches. Increase vitamin D supplementation to 1000-2000 IU daily, depending on current intake.        Return if symptoms worsen or fail to improve.    Donato Schultz, DO

## 2023-08-29 LAB — LYME DISEASE SEROLOGY W/REFLEX

## 2023-08-30 ENCOUNTER — Encounter: Payer: Self-pay | Admitting: Family Medicine

## 2023-08-30 DIAGNOSIS — H25811 Combined forms of age-related cataract, right eye: Secondary | ICD-10-CM | POA: Diagnosis not present

## 2023-08-30 DIAGNOSIS — H25812 Combined forms of age-related cataract, left eye: Secondary | ICD-10-CM | POA: Diagnosis not present

## 2023-08-31 ENCOUNTER — Other Ambulatory Visit

## 2023-08-31 DIAGNOSIS — M0580 Other rheumatoid arthritis with rheumatoid factor of unspecified site: Secondary | ICD-10-CM | POA: Diagnosis not present

## 2023-08-31 DIAGNOSIS — Z8619 Personal history of other infectious and parasitic diseases: Secondary | ICD-10-CM

## 2023-08-31 LAB — EHRLICHIA ANTIBODY PANEL
E. CHAFFEENSIS AB IGG: <1:64 {titer}
E. CHAFFEENSIS AB IGM: <1:20 {titer}

## 2023-08-31 LAB — ROCKY MTN SPOTTED FVR ABS PNL(IGG+IGM)
RMSF IgG: NOT DETECTED
RMSF IgM: NOT DETECTED

## 2023-08-31 LAB — ALPHA-GAL PANEL
Allergen, Mutton, f88: <0.1 kU/L
Allergen, Pork, f26: <0.1 kU/L
Beef: <0.1 kU/L
CLASS: 0
CLASS: 0
Class: 0
GALACTOSE-ALPHA-1,3-GALACTOSE IGE*: <0.1 kU/L (ref <0.10)

## 2023-08-31 LAB — INTERPRETATION:

## 2023-08-31 NOTE — Addendum Note (Signed)
 Addended by: Harrison Mons on: 08/31/2023 10:52 AM   Modules accepted: Orders

## 2023-09-01 ENCOUNTER — Encounter: Payer: Self-pay | Admitting: Family Medicine

## 2023-09-01 LAB — LYME DISEASE SEROLOGY W/REFLEX: Lyme Total Antibody EIA: NEGATIVE

## 2023-09-05 ENCOUNTER — Other Ambulatory Visit: Payer: Self-pay

## 2023-09-12 ENCOUNTER — Ambulatory Visit: Payer: Medicare HMO | Attending: Cardiology | Admitting: Cardiology

## 2023-09-12 ENCOUNTER — Encounter: Payer: Self-pay | Admitting: Cardiology

## 2023-09-12 VITALS — BP 136/76 | HR 69 | Ht 59.0 in | Wt 130.0 lb

## 2023-09-12 DIAGNOSIS — I340 Nonrheumatic mitral (valve) insufficiency: Secondary | ICD-10-CM

## 2023-09-12 DIAGNOSIS — I2510 Atherosclerotic heart disease of native coronary artery without angina pectoris: Secondary | ICD-10-CM | POA: Diagnosis not present

## 2023-09-12 DIAGNOSIS — E782 Mixed hyperlipidemia: Secondary | ICD-10-CM

## 2023-09-12 DIAGNOSIS — T466X5D Adverse effect of antihyperlipidemic and antiarteriosclerotic drugs, subsequent encounter: Secondary | ICD-10-CM | POA: Diagnosis not present

## 2023-09-12 DIAGNOSIS — G720 Drug-induced myopathy: Secondary | ICD-10-CM | POA: Diagnosis not present

## 2023-09-12 NOTE — Patient Instructions (Signed)
 Medication Instructions:  Your physician recommends that you continue on your current medications as directed. Please refer to the Current Medication list given to you today.  *If you need a refill on your cardiac medications before your next appointment, please call your pharmacy*   Lab Work: Your physician recommends that you return for lab work in: 1 month for CMP and lipids. You need to have labs done when you are fasting. [facility] lab is located on the 3rd floor, [address]. Hours are Monday - Friday 8 am to 4 pm, closed 11:30 am to 1:00 pm. You do NOT need an appointment.   If you have labs (blood work) drawn today and your tests are completely normal, you will receive your results only by: MyChart Message (if you have MyChart) OR A paper copy in the mail If you have any lab test that is abnormal or we need to change your treatment, we will call you to review the results.   Testing/Procedures: None ordered   Follow-Up: At [facility], you and your health needs are our priority.  As part of our continuing mission to provide you with exceptional heart care, we have created designated Provider Care Teams.  These Care Teams include your primary Cardiologist (physician) and Advanced Practice Providers (APPs -  Physician Assistants and Nurse Practitioners) who all work together to provide you with the care you need, when you need it.  We recommend signing up for the patient portal called "MyChart".  Sign up information is provided on this After Visit Summary.  MyChart is used to connect with patients for Virtual Visits (Telemedicine).  Patients are able to view lab/test results, encounter notes, upcoming appointments, etc.  Non-urgent messages can be sent to your provider as well.   To learn more about what you can do with MyChart, go to [email or web address].    Your next appointment:   12 month(s)  The format for your next appointment:   In Person  Provider:   Belva Crome, MD    Other Instructions none  Important Information About Sugar

## 2023-09-12 NOTE — Progress Notes (Signed)
 Cardiology Office Note:    Date:  09/12/2023   ID:  Stephanie Lara, DOB Jul 01, 1945, MRN [id number]  PCP:  Zola Button, Grayling Congress, DO  Cardiologist:  Garwin Brothers, MD   Referring MD: Zola Button, Grayling Congress, *    ASSESSMENT:    1. Mixed hyperlipidemia   2. Coronary artery calcification   3. Mitral valve insufficiency, unspecified etiology   4. Statin myopathy    PLAN:    In order of problems listed above:  Elevated calcium score: Secondary prevention stressed with the patient.  Importance of compliance with diet and medications stressed and she understands to.  She has a good exercise tolerance and exercises well.  I congratulated her about this. Mixed dyslipidemia: On lipid lowering medications as discussed below.  She is noncompliant with her medications and will have blood work from November of this.  She will come fasting. Statin intolerance: Which is why she is on these injectable medications in the past and tolerating them well.   Patient will be seen in follow-up appointment in 12 months or earlier if the patient has any concerns.   Medication Adjustments/Labs and Tests Ordered: Current medicines are reviewed at length with the patient today.  Concerns regarding medicines are outlined above.  Orders Placed This Encounter  Procedures   EKG 12-Lead   No orders of the defined types were placed in this encounter.    No chief complaint on file.    History of Present Illness:    Stephanie Lara is a 78 y.o. female.  Patient has past medical history of elevated calcium score, mixed dyslipidemia and statin intolerance.  She denies any problems at this time and takes care of activities of daily living.  No chest pain orthopnea.  She exercises on a regular basis.  She has no symptoms.  She has not taken her Repatha injection for the last 3 months last year because of insurance issues.  She could not afford it.  Now she tells me that her insurance has changed and she reported to  do this at length.  At the time of my evaluation, the patient is alert awake oriented and in no distress.  Past Medical History:  Diagnosis Date   Abnormal biliary HIDA scan 02/25/2021   Chest pain 01/06/2021   Chicken pox    Contact dermatitis due to plants, except food 10/20/2008   Overview:  Dermatitis Due To Contact With Plants   Coronary artery calcification 03/01/2021   Depression, major, single episode, moderate (HCC) 07/28/2022   Dizzy 06/21/2018   Fatigue 11/11/2016   Globus pharyngeus 04/18/2013   Hot flashes 04/26/2019   Hyperlipidemia    Insomnia 03/03/2015   Leukocytes in urine 07/28/2022   Lipoma of neck 11/02/2011   Mid back pain 07/28/2022   Mitral regurgitation 04/07/2021   Myalgia due to statin 03/04/2021   Other headache syndrome 06/21/2018   Preoperative cardiovascular examination 03/01/2021   Preventative health care 01/09/2017   Primary insomnia 12/22/2015   Rheumatoid arthritis (HCC)    RUQ pain 01/06/2021   Statin myopathy 07/27/2022   Upper respiratory tract infection 05/24/2018   Urinary incontinence 03/03/2015    Past Surgical History:  Procedure Laterality Date   ABDOMINOPLASTY     BREAST BIOPSY     CHOLECYSTECTOMY  03/08/2021   LIPOMA EXCISION     neck   PLACEMENT OF BREAST IMPLANTS     RHINOPLASTY     SEPTOPLASTY      Current Medications: Current Meds  Medication Sig   Ascorbic Acid (VITAMIN C) 1000 MG tablet Take 1,000 mg by mouth daily.   aspirin EC 81 MG tablet Take 1 tablet (81 mg total) by mouth daily. Swallow whole.   b complex vitamins capsule Take 1 capsule by mouth daily.   BIOTIN PO Take 1 tablet by mouth daily.   CALCIUM PO Take 1 tablet by mouth daily.   Cholecalciferol (D3 PO) Take 1 tablet by mouth daily.   Evolocumab (REPATHA SURECLICK) 140 MG/ML SOAJ Inject 140 mg into the skin every 14 (fourteen) days.   famotidine (PEPCID) 40 MG tablet Take 40 mg by mouth daily.   nitroGLYCERIN (NITROSTAT) 0.4 MG SL tablet Place  0.4 mg under the tongue every 5 (five) minutes as needed for chest pain.   Omega-3 Fatty Acids (FISH OIL) 1200 MG CAPS Take 1,200 mg by mouth daily.   polyethylene glycol (MIRALAX / GLYCOLAX) 17 g packet Take 17 g by mouth daily as needed for mild constipation or moderate constipation.   traZODone (DESYREL) 50 MG tablet Take 50 mg by mouth at bedtime.   TURMERIC PO Take 1 tablet by mouth daily.     Allergies:   Definity [perflutren lipid microsphere], Lipitor [atorvastatin], Pitavastatin, and Pravastatin   Social History   Socioeconomic History   Marital status: Married    Spouse name: Not on file   Number of children: Not on file   Years of education: Not on file   Highest education level: 12th grade  Occupational History   Occupation: retired Nurse, children's-- [employer]  Tobacco Use   Smoking status: Never    Passive exposure: Past   Smokeless tobacco: Never  Vaping Use   Vaping status: Never Used  Substance and Sexual Activity   Alcohol use: Yes    Alcohol/week: 0.0 standard drinks of alcohol    Comment: Occ   Drug use: No   Sexual activity: Not Currently    Partners: Male  Other Topics Concern   Not on file  Social History Narrative   Exercise ---  2x a week to silver sneakers , 45 min yoga  2 days , senior line dancing    Social Drivers of Health   Financial Resource Strain: Low Risk  (06/21/2023)   Overall Financial Resource Strain (CARDIA)    Difficulty of Paying Living Expenses: Not hard at all  Food Insecurity: No Food Insecurity (06/21/2023)   Hunger Vital Sign    Worried About Running Out of Food in the Last Year: Never true    Ran Out of Food in the Last Year: Never true  Transportation Needs: No Transportation Needs (06/21/2023)   PRAPARE - Administrator, Civil Service (Medical): No    Lack of Transportation (Non-Medical): No  Physical Activity: Insufficiently Active (06/21/2023)   Exercise Vital Sign    Days of Exercise per Week: 3 days    Minutes  of Exercise per Session: 30 min  Stress: No Stress Concern Present (06/21/2023)   [employer] of Occupational Health - Occupational Stress Questionnaire    Feeling of Stress : Not at all  Social Connections: Socially Integrated (06/21/2023)   Social Connection and Isolation Panel [NHANES]    Frequency of Communication with Friends and Family: More than three times a week    Frequency of Social Gatherings with Friends and Family: Once a week    Attends Religious Services: More than 4 times per year    Active Member of Clubs or Organizations: No  Attends Banker Meetings: 1 to 4 times per year    Marital Status: Married     Family History: The patient's family history includes Colon cancer in her brother; Deep vein thrombosis in her father; Diabetes in her brother, brother, mother, and paternal grandmother; Gallbladder disease in her father; Healthy in her daughter, son, and son; Heart attack in her father; Heart disease in her brother, brother, brother, and father; Hypertension in her father; Lung cancer in her brother; Pancreatic cancer in her mother; Sudden death in her father.  ROS:   Please see the history of present illness.    All other systems reviewed and are negative.  EKGs/Labs/Other Studies Reviewed:    The following studies were reviewed today: .Marland KitchenEKG Interpretation Date/Time:  Tuesday September 12 2023 13:01:43 EDT Ventricular Rate:  69 PR Interval:  150 QRS Duration:  74 QT Interval:  416 QTC Calculation: 445 R Axis:   19  Text Interpretation: Normal sinus rhythm Normal ECG When compared with ECG of 25-Feb-2021 14:34, ST less depressed in Inferior leads Nonspecific T wave abnormality, improved in Inferior leads Confirmed by Belva Crome [phone]) on 09/12/2023 1:18:08 PM     Recent Labs: 06/22/2023: ALT 17; BUN 24; Creatinine, Ser 0.62; Hemoglobin 13.5; Platelets 249.0; Potassium 3.8; Sodium 143; TSH 1.41  Recent Lipid Panel    Component Value Date/Time    CHOL 230 (H) 06/22/2023 1442   CHOL 174 07/28/2022 0917   TRIG 105.0 06/22/2023 1442   HDL 75.70 06/22/2023 1442   HDL 80 07/28/2022 0917   CHOLHDL 3 06/22/2023 1442   VLDL 21.0 06/22/2023 1442   LDLCALC 134 (H) 06/22/2023 1442   LDLCALC 82 07/28/2022 0917   LDLCALC 190 (H) 04/06/2020 1350    Physical Exam:    VS:  BP 136/76   Pulse 69   Ht 4\' 11"  (1.499 m)   Wt 130 lb (59 kg)   SpO2 95%   BMI 26.26 kg/m     Wt Readings from Last 3 Encounters:  09/12/23 130 lb (59 kg)  08/28/23 128 lb 3.2 oz (58.2 kg)  07/26/23 126 lb 6.4 oz (57.3 kg)     GEN: Patient is in no acute distress HEENT: Normal NECK: No JVD; No carotid bruits LYMPHATICS: No lymphadenopathy CARDIAC: Hear sounds regular, 2/6 systolic murmur at the apex. RESPIRATORY:  Clear to auscultation without rales, wheezing or rhonchi  ABDOMEN: Soft, non-tender, non-distended MUSCULOSKELETAL:  No edema; No deformity  SKIN: Warm and dry NEUROLOGIC:  Alert and oriented x 3 PSYCHIATRIC:  Normal affect   Signed, Garwin Brothers, MD  09/12/2023 1:26 PM    [facility]

## 2023-09-17 ENCOUNTER — Other Ambulatory Visit: Payer: Self-pay | Admitting: Cardiology

## 2023-09-19 NOTE — Progress Notes (Signed)
 Office Visit Note  Patient: Stephanie Lara             Date of Birth: December 19, 1945           MRN: [id number]             PCP: Estill Hemming, DO Referring: Estill Hemming, * Visit Date: 09/20/2023   Subjective:  Follow-up   History of Present Illness: Stephanie Lara is a 78 y.o. female here for follow up for seropositive rheumatoid arthritis.  Better initial visit was somewhat nonspecific with a low positive ANA 1: 40 titer but negative acute phase reactants for inflammation.  Follow-up with her PCP office also included further workup for tickborne infectious disease or alpha gal panel these were negative.  She continues having some joint aches but currently her more limiting symptom is general fatigue.  Some joint aches but currently her more limiting symptom is general fatigue.  Notices symptoms worsening later in the day as compared to first thing in the morning.  She tried addition of tart cherry as an anti-inflammatory supplement without interesting benefit after about 1 month.  Previous HPI 07/26/23 Stephanie Lara is a 78 year old female with history of rheumatoid arthritis who presents with generalized body aches and malaise.   She has a history of rheumatoid arthritis, initially diagnosed as palindromic rheumatism in 2003. Her symptoms have been episodic, with bouts lasting 24 hours and resolving for months or years. Initially, the pain was localized to her wrists, elbows, and hips, but it has evolved into a generalized achiness resembling flu-like symptoms without fever. Transition was apparently gradual not exactly clear when. This current episode of widespread achiness has persisted for about a month. No visible joint swelling, rashes, or significant morning stiffness. Generally feels well upon waking, with achiness starting about 30 minutes after getting up. No specific pattern related to weather or activity level, although she continues to exercise through the  discomfort.   She takes 500 mg of Tylenol, which does not alleviate her symptoms. Previously tried meloxicam but not recommended due to CV disease, and was prescribed methotrexate, which she discontinued due to concerns about side effects. She has a high positive CCP antibody for rheumatoid arthritis since at least 2016 but has not been on long-term daily medication for RA.   She has attempted to manage her condition through diet, anti-inflammatory supplements, and exercise, including senior aerobics, yoga, and dance classes. However, she has not participated in some of these activities for about a month due to her symptoms.   Her past medical history includes coronary artery disease, which she attributes to a family history of heart disease. Her father died of heart disease at 37, and all her brothers have heart disease.   No fever, joint swelling, rashes, dry eyes, dry mouth, lymph node swelling, or carpal tunnel syndrome. Reports difficulty sleeping recently due to discontinuation of pristiq.   Labs reviewed 06/2023 ANA 1:40 homogenous RF 45 CRP 2.46 CBC wnl CMP wnl  02/2015 CCP >250   Review of Systems  Constitutional:  Positive for fatigue.  HENT:  Positive for mouth dryness. Negative for mouth sores.   Eyes:  Negative for dryness.  Respiratory:  Negative for shortness of breath.   Cardiovascular:  Negative for chest pain and palpitations.  Gastrointestinal:  Negative for blood in stool, constipation and diarrhea.  Endocrine: Negative for increased urination.  Genitourinary:  Negative for involuntary urination.  Musculoskeletal:  Positive for joint pain, joint pain,  myalgias and myalgias. Negative for gait problem, joint swelling, muscle weakness, morning stiffness and muscle tenderness.  Skin:  Negative for color change, rash, hair loss and sensitivity to sunlight.  Allergic/Immunologic: Negative for susceptible to infections.  Neurological:  Negative for dizziness and  headaches.  Hematological:  Negative for swollen glands.  Psychiatric/Behavioral:  Negative for depressed mood and sleep disturbance. The patient is not nervous/anxious.     PMFS History:  Patient Active Problem List   Diagnosis Date Noted   Depression, major, single episode, moderate (HCC) 07/28/2022   Mid back pain 07/28/2022   Leukocytes in urine 07/28/2022   Statin myopathy 07/27/2022   Mitral regurgitation 04/07/2021   Myalgia due to statin 03/04/2021   Coronary artery calcification 03/01/2021   Preoperative cardiovascular examination 03/01/2021   Abnormal biliary HIDA scan 02/25/2021   Chicken pox 01/06/2021   Hyperlipidemia 01/06/2021   Chest pain 01/06/2021   RUQ pain 01/06/2021   Hot flashes 04/26/2019   Dizzy 06/21/2018   Other headache syndrome 06/21/2018   Upper respiratory tract infection 05/24/2018   Preventative health care 01/09/2017   Fatigue 11/11/2016   Primary insomnia 12/22/2015   Urinary incontinence 03/03/2015   Rheumatoid arthritis (HCC) 03/03/2015   Insomnia 03/03/2015   Globus pharyngeus 04/18/2013   Lipoma of neck 11/02/2011   Contact dermatitis due to plants, except food 10/20/2008    Past Medical History:  Diagnosis Date   Abnormal biliary HIDA scan 02/25/2021   Chest pain 01/06/2021   Chicken pox    Contact dermatitis due to plants, except food 10/20/2008   Overview:  Dermatitis Due To Contact With Plants   Coronary artery calcification 03/01/2021   Depression, major, single episode, moderate (HCC) 07/28/2022   Dizzy 06/21/2018   Fatigue 11/11/2016   Globus pharyngeus 04/18/2013   Hot flashes 04/26/2019   Hyperlipidemia    Insomnia 03/03/2015   Leukocytes in urine 07/28/2022   Lipoma of neck 11/02/2011   Mid back pain 07/28/2022   Mitral regurgitation 04/07/2021   Myalgia due to statin 03/04/2021   Other headache syndrome 06/21/2018   Preoperative cardiovascular examination 03/01/2021   Preventative health care 01/09/2017    Primary insomnia 12/22/2015   Rheumatoid arthritis (HCC)    RUQ pain 01/06/2021   Statin myopathy 07/27/2022   Upper respiratory tract infection 05/24/2018   Urinary incontinence 03/03/2015    Family History  Problem Relation Age of Onset   Pancreatic cancer Mother    Diabetes Mother    Hypertension Father    Heart disease Father    Sudden death Father    Heart attack Father    Gallbladder disease Father    Deep vein thrombosis Father    Heart disease Brother    Heart disease Brother    Diabetes Brother    Diabetes Brother    Heart disease Brother    Colon cancer Brother    Lung cancer Brother    Diabetes Paternal Grandmother    Healthy Son    Healthy Son    Healthy Daughter    Past Surgical History:  Procedure Laterality Date   ABDOMINOPLASTY     BREAST BIOPSY     CHOLECYSTECTOMY  03/08/2021   LIPOMA EXCISION     neck   PLACEMENT OF BREAST IMPLANTS     RHINOPLASTY     SEPTOPLASTY     Social History   Social History Narrative   Exercise ---  2x a week to silver sneakers , 45 min yoga  2 days ,  senior line dancing    Immunization History  Administered Date(s) Administered   Fluad Quad(high Dose 65+) 04/04/2019, 04/06/2020, 04/07/2021   Influenza,inj,Quad PF,6+ Mos 03/03/2015   Influenza-Unspecified 04/13/2014, 03/16/2017   Moderna Sars-Covid-2 Vaccination 07/15/2019, 09/06/2019   Pneumococcal Conjugate-13 09/10/2015   Pneumococcal Polysaccharide-23 03/16/2017   Pneumococcal-Unspecified 06/13/2013   Zoster Recombinant(Shingrix) 05/20/2017, 10/02/2017   Zoster, Live 11/19/2010, 06/14/2011     Objective: Vital Signs: BP (!) 140/83 (BP Location: Left Arm, Patient Position: Sitting, Cuff Size: Normal)   Pulse 64   Resp 14   Ht 4\' 11"  (1.499 m)   Wt 130 lb (59 kg)   BMI 26.26 kg/m    Physical Exam Eyes:     Conjunctiva/sclera: Conjunctivae normal.  Cardiovascular:     Rate and Rhythm: Normal rate and regular rhythm.  Pulmonary:     Effort: Pulmonary  effort is normal.     Breath sounds: Normal breath sounds.  Lymphadenopathy:     Cervical: No cervical adenopathy.  Skin:    General: Skin is warm and dry.     Findings: No rash.  Neurological:     Mental Status: She is alert.  Psychiatric:        Mood and Affect: Mood normal.      Musculoskeletal Exam:  Shoulders full ROM no tenderness or swelling Elbows full ROM no tenderness or swelling Wrists full ROM no tenderness or swelling Fingers full ROM no tenderness or swelling Knees full ROM no tenderness or swelling   Investigation: No additional findings.  Imaging: No results found.  Recent Labs: Lab Results  Component Value Date   WBC 4.4 06/22/2023   HGB 13.5 06/22/2023   PLT 249.0 06/22/2023   NA 143 06/22/2023   K 3.8 06/22/2023   CL 105 06/22/2023   CO2 30 06/22/2023   GLUCOSE 94 06/22/2023   BUN 24 (H) 06/22/2023   CREATININE 0.62 06/22/2023   BILITOT 0.7 06/22/2023   ALKPHOS 53 06/22/2023   AST 21 06/22/2023   ALT 17 06/22/2023   PROT 6.3 06/22/2023   ALBUMIN 4.3 06/22/2023   CALCIUM 9.3 06/22/2023    Speciality Comments: No specialty comments available.  Procedures:  No procedures performed Allergies: Definity [perflutren lipid microsphere], Lipitor [atorvastatin], Pitavastatin, and Pravastatin   Assessment / Plan:     Visit Diagnoses: Rheumatoid arthritis involving multiple sites with positive rheumatoid factor (HCC) - Plan: hydroxychloroquine (PLAQUENIL) 200 MG tablet Clinical picture still not 100% confirmed as I do not see any peripheral joint synovitis on repeat exam and also with normal serum inflammatory markers.  However given the polyarticular inflammatory type joint pain and positive rheumatoid factor the intermittent severity of symptoms could fit with palindromic rheumatism.  Will start hydroxychloroquine at 200 mg once daily with scheduled follow-up for repeat exam and see symptom response   Orders: No orders of the defined types were  placed in this encounter.  Meds ordered this encounter  Medications   hydroxychloroquine (PLAQUENIL) 200 MG tablet    Sig: Take 1 tablet (200 mg total) by mouth daily.    Dispense:  90 tablet    Refill:  0     Follow-Up Instructions: Return in about 3 months (around 12/20/2023) for RA HCQ star f/u ~76mos.   Matt Song, MD  Note - This record has been created using [employer].  Chart creation errors have been sought, but may not always  have been located. Such creation errors do not reflect on  the standard of medical  care.

## 2023-09-20 ENCOUNTER — Ambulatory Visit: Payer: Medicare HMO | Attending: Internal Medicine | Admitting: Internal Medicine

## 2023-09-20 ENCOUNTER — Encounter: Payer: Self-pay | Admitting: Internal Medicine

## 2023-09-20 VITALS — BP 140/83 | HR 64 | Resp 14 | Ht 59.0 in | Wt 130.0 lb

## 2023-09-20 DIAGNOSIS — M0579 Rheumatoid arthritis with rheumatoid factor of multiple sites without organ or systems involvement: Secondary | ICD-10-CM

## 2023-09-20 MED ORDER — HYDROXYCHLOROQUINE SULFATE 200 MG PO TABS
200.0000 mg | ORAL_TABLET | Freq: Every day | ORAL | 0 refills | Status: DC
Start: 2023-09-20 — End: 2023-12-20

## 2023-09-20 NOTE — Patient Instructions (Signed)
 Hydroxychloroquine Tablets What is this medication? HYDROXYCHLOROQUINE (hye drox ee KLOR oh kwin) treats autoimmune conditions, such as rheumatoid arthritis and lupus. It works by slowing down an overactive immune system. It may also be used to prevent and treat malaria. It works by killing the parasite that causes malaria. It belongs to a group of medications called DMARDs. This medicine may be used for other purposes; ask your health care provider or pharmacist if you have questions. COMMON BRAND NAME(S): Plaquenil, Quineprox, SOVUNA What should I tell my care team before I take this medication? They need to know if you have any of these conditions: Diabetes Eye disease, vision problems Frequently drink alcohol G6PD deficiency Heart disease Irregular heartbeat or rhythm Kidney disease Liver disease Porphyria Psoriasis An unusual or allergic reaction to hydroxychloroquine, other medications, foods, dyes, or preservatives Pregnant or trying to get pregnant Breastfeeding How should I use this medication? Take this medication by mouth with water. Take it as directed on the prescription label. Do not cut, crush, or chew this medication. Swallow the tablets whole. Take it with food. Do not take it more than directed. Take all of this medication unless your care team tells you to stop it early. Keep taking it even if you think you are better. Take products with antacids in them at a different time of day than this medication. Take this medication 4 hours before or 4 hours after antacids. Talk to your care team if you have questions. Talk to your care team about the use of this medication in children. While this medication may be prescribed for selected conditions, precautions do apply. Overdosage: If you think you have taken too much of this medicine contact a poison control center or emergency room at once. NOTE: This medicine is only for you. Do not share this medicine with others. What if I  miss a dose? If you miss a dose, take it as soon as you can. If it is almost time for your next dose, take only that dose. Do not take double or extra doses. What may interact with this medication? Do not take this medication with any of the following: Cisapride Dronedarone Pimozide Thioridazine This medication may also interact with the following: Ampicillin Antacids Cimetidine Cyclosporine Digoxin Kaolin Medications for diabetes, such as insulin, glipizide, glyburide Medications for seizures, such as carbamazepine, phenobarbital, phenytoin Mefloquine Methotrexate Other medications that cause heart rhythm changes Praziquantel This list may not describe all possible interactions. Give your health care provider a list of all the medicines, herbs, non-prescription drugs, or dietary supplements you use. Also tell them if you smoke, drink alcohol, or use illegal drugs. Some items may interact with your medicine. What should I watch for while using this medication? Visit your care team for regular checks on your progress. Tell your care team if your symptoms do not start to get better or if they get worse. You may need blood work done while you are taking this medication. If you take other medications that can affect heart rhythm, you may need more testing. Talk to your care team if you have questions. Your vision may be tested before and during use of this medication. Tell your care team right away if you have any change in your eyesight. This medication may cause serious skin reactions. They can happen weeks to months after starting the medication. Contact your care team right away if you notice fevers or flu-like symptoms with a rash. The rash may be red or purple and then  turn into blisters or peeling of the skin. Or, you might notice a red rash with swelling of the face, lips or lymph nodes in your neck or under your arms. If you or your family notice any changes in your behavior, such as  new or worsening depression, thoughts of harming yourself, anxiety, or other unusual or disturbing thoughts, or memory loss, call your care team right away. What side effects may I notice from receiving this medication? Side effects that you should report to your care team as soon as possible: Allergic reactions--skin rash, itching, hives, swelling of the face, lips, tongue, or throat Aplastic anemia--unusual weakness or fatigue, dizziness, headache, trouble breathing, increased bleeding or bruising Change in vision Heart rhythm changes--fast or irregular heartbeat, dizziness, feeling faint or lightheaded, chest pain, trouble breathing Infection--fever, chills, cough, or sore throat Low blood sugar (hypoglycemia)--tremors or shaking, anxiety, sweating, cold or clammy skin, confusion, dizziness, rapid heartbeat Muscle injury--unusual weakness or fatigue, muscle pain, dark yellow or brown urine, decrease in amount of urine Pain, tingling, or numbness in the hands or feet Rash, fever, and swollen lymph nodes Redness, blistering, peeling, or loosening of the skin, including inside the mouth Thoughts of suicide or self-harm, worsening mood, or feelings of depression Unusual bruising or bleeding Side effects that usually do not require medical attention (report to your care team if they continue or are bothersome): Diarrhea Headache Nausea Stomach pain Vomiting This list may not describe all possible side effects. Call your doctor for medical advice about side effects. You may report side effects to FDA at 1-800-FDA-1088. Where should I keep my medication? Keep out of the reach of children and pets. Store at room temperature up to 30 degrees C (86 degrees F). Protect from light. Get rid of any unused medication after the expiration date. To get rid of medications that are no longer needed or have expired: Take the medication to a medication take-back program. Check with your pharmacy or law  enforcement to find a location. If you cannot return the medication, check the label or package insert to see if the medication should be thrown out in the garbage or flushed down the toilet. If you are not sure, ask your care team. If it is safe to put it in the trash, empty the medication out of the container. Mix the medication with cat litter, dirt, coffee grounds, or other unwanted substance. Seal the mixture in a bag or container. Put it in the trash. NOTE: This sheet is a summary. It may not cover all possible information. If you have questions about this medicine, talk to your doctor, pharmacist, or health care provider.  2024 Elsevier/Gold Standard (2021-12-06 00:00:00)

## 2023-10-12 DIAGNOSIS — E782 Mixed hyperlipidemia: Secondary | ICD-10-CM | POA: Diagnosis not present

## 2023-10-12 DIAGNOSIS — I2510 Atherosclerotic heart disease of native coronary artery without angina pectoris: Secondary | ICD-10-CM | POA: Diagnosis not present

## 2023-10-13 LAB — LIPID PANEL
Chol/HDL Ratio: 2.5 ratio (ref 0.0–4.4)
Cholesterol, Total: 197 mg/dL (ref 100–199)
HDL: 78 mg/dL (ref >39)
LDL Chol Calc (NIH): 106 mg/dL — ABNORMAL HIGH (ref 0–99)
Triglycerides: 73 mg/dL (ref 0–149)
VLDL Cholesterol Cal: 13 mg/dL (ref 5–40)

## 2023-10-13 LAB — COMPREHENSIVE METABOLIC PANEL WITH GFR
ALT: 15 IU/L (ref 0–32)
AST: 21 IU/L (ref 0–40)
Albumin: 4.6 g/dL (ref 3.8–4.8)
Alkaline Phosphatase: 60 IU/L (ref 44–121)
BUN/Creatinine Ratio: 29 — ABNORMAL HIGH (ref 12–28)
BUN: 25 mg/dL (ref 8–27)
Bilirubin Total: 0.8 mg/dL (ref 0.0–1.2)
CO2: 25 mmol/L (ref 20–29)
Calcium: 10 mg/dL (ref 8.7–10.3)
Chloride: 106 mmol/L (ref 96–106)
Creatinine, Ser: 0.85 mg/dL (ref 0.57–1.00)
Globulin, Total: 2.2 g/dL (ref 1.5–4.5)
Glucose: 94 mg/dL (ref 70–99)
Potassium: 5.2 mmol/L (ref 3.5–5.2)
Sodium: 143 mmol/L (ref 134–144)
Total Protein: 6.8 g/dL (ref 6.0–8.5)
eGFR: 71 mL/min/{1.73_m2} (ref >59)

## 2023-10-16 ENCOUNTER — Telehealth: Payer: Self-pay

## 2023-10-16 DIAGNOSIS — E782 Mixed hyperlipidemia: Secondary | ICD-10-CM

## 2023-10-16 DIAGNOSIS — I2510 Atherosclerotic heart disease of native coronary artery without angina pectoris: Secondary | ICD-10-CM

## 2023-10-16 NOTE — Telephone Encounter (Signed)
-----   Message from [place] Revankar sent at 10/15/2023  5:40 PM EDT ----- Lipids are better but goal LDL less than 60 so diet and exercise and recheck in 3 months.  Copy primary Nelia Balzarine, MD 10/15/2023 5:39 PM

## 2023-10-16 NOTE — Telephone Encounter (Signed)
 MyChart message

## 2023-12-06 NOTE — Progress Notes (Signed)
 Office Visit Note  Patient: Stephanie Lara             Date of Birth: 1945-09-23           MRN: [id number]             PCP: Antonio Cyndee Jamee JONELLE, DO Referring: Antonio Cyndee Jamee JONELLE, * Visit Date: 12/20/2023   Subjective:  Follow-up (Patient states her back has been sore. Patient states she is out of hydroxychloroquine . )    Discussed the use of AI scribe software for clinical note transcription with the patient, who gave verbal consent to proceed.  History of Present Illness   Stephanie Lara is a 78 y.o. female here for follow up for seropositive rheumatoid arthritis.  She complains of recent back pain.  She has been experiencing intermittent pruritus without rash, occurring three times over the past couple of weeks. The pruritus affects her face, legs, and arms, lasting a couple of hours before resolving. She has been on Plaquenil  for a few months to manage her arthritis, and she is unsure if the pruritus is related to the medication. No rash accompanies the pruritus.  Recent back pain began after attending an exercise class on Monday, where she switched from three-pound to four-pound weights. The pain started after lying down post-exercise and was significant upon trying to get up. She managed the pain with Tylenol for arthritis, ibuprofen , and a pain spray applied by her husband, which provided relief. The pain was still present but improved by the following day. It is located in the lumbar region, affecting both the middle and sides, without radiation to the legs. She has experienced similar back pain twice in the last few weeks. No sciatica or radiation of back pain into the legs. She does not recall any prior diagnosis of back disease. No sleep disturbances due to back pain.  Her exercise routine includes attending a [employer] aerobics class and a senior chair yoga class twice a week. Certain movements, particularly back bends, can aggravate her back pain, and she  requires lumbar support when sitting for extended periods to prevent discomfort.       Previous HPI 09/20/2023 Stephanie Lara is a 78 y.o. female here for follow up for seropositive rheumatoid arthritis.   Better initial visit was somewhat nonspecific with a low positive ANA 1: 40 titer but negative acute phase reactants for inflammation.  Follow-up with her PCP office also included further workup for tickborne infectious disease or alpha gal panel these were negative.  She continues having some joint aches but currently her more limiting symptom is general fatigue.  Some joint aches but currently her more limiting symptom is general fatigue.  Notices symptoms worsening later in the day as compared to first thing in the morning.  She tried addition of tart cherry as an anti-inflammatory supplement without interesting benefit after about 1 month.   Previous HPI 07/26/23 Stephanie Lara is a 78 year old female with history of rheumatoid arthritis who presents with generalized body aches and malaise.   She has a history of rheumatoid arthritis, initially diagnosed as palindromic rheumatism in 2003. Her symptoms have been episodic, with bouts lasting 24 hours and resolving for months or years. Initially, the pain was localized to her wrists, elbows, and hips, but it has evolved into a generalized achiness resembling flu-like symptoms without fever. Transition was apparently gradual not exactly clear when. This current episode of widespread achiness has persisted for about a month. No  visible joint swelling, rashes, or significant morning stiffness. Generally feels well upon waking, with achiness starting about 30 minutes after getting up. No specific pattern related to weather or activity level, although she continues to exercise through the discomfort.   She takes 500 mg of Tylenol, which does not alleviate her symptoms. Previously tried meloxicam  but not recommended due to CV disease, and was prescribed  methotrexate, which she discontinued due to concerns about side effects. She has a high positive CCP antibody for rheumatoid arthritis since at least 2016 but has not been on long-term daily medication for RA.   She has attempted to manage her condition through diet, anti-inflammatory supplements, and exercise, including senior aerobics, yoga, and dance classes. However, she has not participated in some of these activities for about a month due to her symptoms.   Her past medical history includes coronary artery disease, which she attributes to a family history of heart disease. Her father died of heart disease at 45, and all her brothers have heart disease.   No fever, joint swelling, rashes, dry eyes, dry mouth, lymph node swelling, or carpal tunnel syndrome. Reports difficulty sleeping recently due to discontinuation of pristiq .   Labs reviewed 06/2023 ANA 1:40 homogenous RF 45 CRP 2.46 CBC wnl CMP wnl   02/2015 CCP >250   Review of Systems  Constitutional:  Negative for fatigue.  HENT:  Negative for mouth sores and mouth dryness.   Eyes:  Negative for dryness.  Respiratory:  Negative for shortness of breath.   Cardiovascular:  Negative for chest pain and palpitations.  Gastrointestinal:  Negative for blood in stool, constipation and diarrhea.  Endocrine: Positive for increased urination.  Genitourinary:  Positive for involuntary urination.  Musculoskeletal:  Positive for muscle tenderness. Negative for joint pain, gait problem, joint pain, joint swelling, myalgias, muscle weakness, morning stiffness and myalgias.  Skin:  Negative for color change, rash, hair loss and sensitivity to sunlight.  Allergic/Immunologic: Negative for susceptible to infections.  Neurological:  Negative for dizziness and headaches.  Hematological:  Negative for swollen glands.  Psychiatric/Behavioral:  Negative for depressed mood and sleep disturbance. The patient is not nervous/anxious.     PMFS  History:  Patient Active Problem List   Diagnosis Date Noted   Depression, major, single episode, moderate (HCC) 07/28/2022   Mid back pain 07/28/2022   Leukocytes in urine 07/28/2022   Statin myopathy 07/27/2022   Mitral regurgitation 04/07/2021   Myalgia due to statin 03/04/2021   Coronary artery calcification 03/01/2021   Preoperative cardiovascular examination 03/01/2021   Abnormal biliary HIDA scan 02/25/2021   Chicken pox 01/06/2021   Hyperlipidemia 01/06/2021   Chest pain 01/06/2021   RUQ pain 01/06/2021   Hot flashes 04/26/2019   Dizzy 06/21/2018   Other headache syndrome 06/21/2018   Upper respiratory tract infection 05/24/2018   Preventative health care 01/09/2017   Fatigue 11/11/2016   Primary insomnia 12/22/2015   Urinary incontinence 03/03/2015   Rheumatoid arthritis (HCC) 03/03/2015   Insomnia 03/03/2015   Globus pharyngeus 04/18/2013   Lipoma of neck 11/02/2011   Contact dermatitis due to plants, except food 10/20/2008    Past Medical History:  Diagnosis Date   Abnormal biliary HIDA scan 02/25/2021   Chest pain 01/06/2021   Chicken pox    Contact dermatitis due to plants, except food 10/20/2008   Overview:  Dermatitis Due To Contact With Plants   Coronary artery calcification 03/01/2021   Depression, major, single episode, moderate (HCC) 07/28/2022   Dizzy 06/21/2018  Fatigue 11/11/2016   Globus pharyngeus 04/18/2013   Hot flashes 04/26/2019   Hyperlipidemia    Insomnia 03/03/2015   Leukocytes in urine 07/28/2022   Lipoma of neck 11/02/2011   Mid back pain 07/28/2022   Mitral regurgitation 04/07/2021   Myalgia due to statin 03/04/2021   Other headache syndrome 06/21/2018   Preoperative cardiovascular examination 03/01/2021   Preventative health care 01/09/2017   Primary insomnia 12/22/2015   Rheumatoid arthritis (HCC)    RUQ pain 01/06/2021   Statin myopathy 07/27/2022   Upper respiratory tract infection 05/24/2018   Urinary incontinence  03/03/2015    Family History  Problem Relation Age of Onset   Pancreatic cancer Mother    Diabetes Mother    Hypertension Father    Heart disease Father    Sudden death Father    Heart attack Father    Gallbladder disease Father    Deep vein thrombosis Father    Heart disease Brother    Heart disease Brother    Diabetes Brother    Diabetes Brother    Heart disease Brother    Colon cancer Brother    Lung cancer Brother    Diabetes Paternal Grandmother    Healthy Son    Healthy Son    Healthy Daughter    Past Surgical History:  Procedure Laterality Date   ABDOMINOPLASTY     BREAST BIOPSY     CHOLECYSTECTOMY  03/08/2021   LIPOMA EXCISION     neck   PLACEMENT OF BREAST IMPLANTS     RHINOPLASTY     SEPTOPLASTY     Social History   Social History Narrative   Exercise ---  2x a week to silver sneakers , 45 min yoga  2 days , senior line dancing    Immunization History  Administered Date(s) Administered   Fluad Quad(high Dose 65+) 04/04/2019, 04/06/2020, 04/07/2021   Influenza,inj,Quad PF,6+ Mos 03/03/2015   Influenza-Unspecified 04/13/2014, 03/16/2017   Moderna Sars-Covid-2 Vaccination 07/15/2019, 09/06/2019   Pneumococcal Conjugate-13 09/10/2015   Pneumococcal Polysaccharide-23 03/16/2017   Pneumococcal-Unspecified 06/13/2013   Zoster Recombinant(Shingrix) 05/20/2017, 10/02/2017   Zoster, Live 11/19/2010, 06/14/2011     Objective: Vital Signs: BP 136/85 (BP Location: Left Arm, Patient Position: Sitting, Cuff Size: Normal)   Pulse 67   Resp 12   Ht 4' 11 (1.499 m)   Wt 127 lb (57.6 kg)   BMI 25.65 kg/m    Physical Exam Eyes:     Conjunctiva/sclera: Conjunctivae normal.  Cardiovascular:     Rate and Rhythm: Normal rate and regular rhythm.  Pulmonary:     Effort: Pulmonary effort is normal.     Breath sounds: Normal breath sounds.  Musculoskeletal:     Right lower leg: No edema.     Left lower leg: No edema.  Skin:    General: Skin is warm and dry.   Neurological:     Mental Status: She is alert.  Psychiatric:        Mood and Affect: Mood normal.      Musculoskeletal Exam:  Shoulders full ROM no tenderness or swelling Elbows full ROM no tenderness or swelling Wrists full ROM no tenderness or swelling Fingers full ROM no tenderness or swelling No reproducible pain on exam over thoracic and lumbar spine Knees full ROM no tenderness or swelling    Investigation: No additional findings.  Imaging: No results found.  Recent Labs: Lab Results  Component Value Date   WBC 4.4 06/22/2023   HGB 13.5 06/22/2023  PLT 249.0 06/22/2023   NA 143 10/12/2023   K 5.2 10/12/2023   CL 106 10/12/2023   CO2 25 10/12/2023   GLUCOSE 94 10/12/2023   BUN 25 10/12/2023   CREATININE 0.85 10/12/2023   BILITOT 0.8 10/12/2023   ALKPHOS 60 10/12/2023   AST 21 10/12/2023   ALT 15 10/12/2023   PROT 6.8 10/12/2023   ALBUMIN 4.6 10/12/2023   CALCIUM  10.0 10/12/2023    Speciality Comments: No specialty comments available.  Procedures:  No procedures performed Allergies: Definity  [perflutren  lipid microsphere], Lipitor [atorvastatin ], Pitavastatin , and Pravastatin    Assessment / Plan:     Visit Diagnoses: Rheumatoid arthritis involving multiple sites with positive rheumatoid factor (HCC) - Plan: hydroxychloroquine  (PLAQUENIL ) 200 MG tablet Rheumatoid arthritis managed with hydroxychloroquine .  Hard to gauge response for sure with picture of palindromic rheumatism with gaps in between active synovitis.  No recent flare-ups, indicating medication efficacy. Intermittent itchiness reported, not a common side effect of hydroxychloroquine . - Continue/resume hydroxychloroquine  200 mg daily  Lumbar muscle strain Acute lumbar muscle strain due to increased exercise intensity. Pain localized to lumbar region, improved with rest and OTC medications. No red flags suggestive of disc problems. - Continue Tylenol and ibuprofen  as needed. - Consider  meloxicam  for pain management. - Avoid combining meloxicam  with other NSAIDs. - Provide lumbar muscle strain rehabilitation exercises. - Avoid imaging unless symptoms persist for more than six weeks or red flag symptoms develop.        Orders: No orders of the defined types were placed in this encounter.  Meds ordered this encounter  Medications   hydroxychloroquine  (PLAQUENIL ) 200 MG tablet    Sig: Take 1 tablet (200 mg total) by mouth daily.    Dispense:  90 tablet    Refill:  0     Follow-Up Instructions: Return in about 6 months (around 06/21/2024) for RA/back on HCQ f/u 3mos.   Lonni LELON Ester, MD  Note - This record has been created using [employer].  Chart creation errors have been sought, but may not always  have been located. Such creation errors do not reflect on  the standard of medical care.

## 2023-12-12 ENCOUNTER — Encounter: Payer: Medicare HMO | Admitting: Internal Medicine

## 2023-12-20 ENCOUNTER — Encounter: Payer: Self-pay | Admitting: Family Medicine

## 2023-12-20 ENCOUNTER — Ambulatory Visit: Attending: Internal Medicine | Admitting: Internal Medicine

## 2023-12-20 ENCOUNTER — Encounter: Payer: Self-pay | Admitting: Internal Medicine

## 2023-12-20 VITALS — BP 136/85 | HR 67 | Resp 12 | Ht 59.0 in | Wt 127.0 lb

## 2023-12-20 DIAGNOSIS — M0579 Rheumatoid arthritis with rheumatoid factor of multiple sites without organ or systems involvement: Secondary | ICD-10-CM | POA: Diagnosis not present

## 2023-12-20 DIAGNOSIS — Z79899 Other long term (current) drug therapy: Secondary | ICD-10-CM

## 2023-12-20 MED ORDER — HYDROXYCHLOROQUINE SULFATE 200 MG PO TABS
200.0000 mg | ORAL_TABLET | Freq: Every day | ORAL | 0 refills | Status: DC
Start: 2023-12-20 — End: 2024-03-04

## 2024-01-15 ENCOUNTER — Encounter: Payer: Self-pay | Admitting: Cardiology

## 2024-01-15 ENCOUNTER — Ambulatory Visit (INDEPENDENT_AMBULATORY_CARE_PROVIDER_SITE_OTHER): Admitting: Family Medicine

## 2024-01-15 VITALS — BP 120/70 | HR 70 | Temp 98.1°F | Resp 18 | Ht 59.0 in | Wt 129.6 lb

## 2024-01-15 DIAGNOSIS — R252 Cramp and spasm: Secondary | ICD-10-CM

## 2024-01-15 DIAGNOSIS — K219 Gastro-esophageal reflux disease without esophagitis: Secondary | ICD-10-CM | POA: Diagnosis not present

## 2024-01-15 DIAGNOSIS — R1013 Epigastric pain: Secondary | ICD-10-CM

## 2024-01-15 DIAGNOSIS — R350 Frequency of micturition: Secondary | ICD-10-CM | POA: Diagnosis not present

## 2024-01-15 DIAGNOSIS — R1011 Right upper quadrant pain: Secondary | ICD-10-CM | POA: Diagnosis not present

## 2024-01-15 MED ORDER — PANTOPRAZOLE SODIUM 40 MG PO TBEC: 40.0000 mg | DELAYED_RELEASE_TABLET | Freq: Every day | ORAL | 3 refills | Status: AC

## 2024-01-15 NOTE — Progress Notes (Signed)
 Subjective:    Patient ID: Stephanie Lara, female    DOB: 07-20-1945, 78 y.o.   MRN: [id number]  Chief Complaint  Patient presents with   Discuss Labs    HPI Patient is in today for c/o RUQ pain   Discussed the use of AI scribe software for clinical note transcription with the patient, who gave verbal consent to proceed.  History of Present Illness Stephanie Lara is a 78 year old female with a history of cholecystectomy who presents with recurrent abdominal pain.  She experiences recurrent abdominal pain in the same location as her previous gallbladder pain. The pain is not constant but occurs frequently after eating, particularly in the afternoons and after dinner. She describes the pain as 'really uncomfortable' and notes associated bloating. She has been trying to eat better and reduce portion sizes, which seems to help, but the pain remains unpredictable. Her liver function tests were normal as of May 1st.  She occasionally experiences heartburn and has been taking famotidine, which she restarted recently due to her stomach issues. She previously took pantoprazole  before her gallbladder surgery.  She reports experiencing achy legs, particularly starting around 11 or 12 o'clock after her morning walk. No incontinence or burning with urination, but increased frequency of urination without burning.  Her family history includes a sister who was born without bile ducts and passed away at a young age, and a friend who recently died from bile duct cancer.    Past Medical History:  Diagnosis Date   Abnormal biliary HIDA scan 02/25/2021   Chest pain 01/06/2021   Chicken pox    Contact dermatitis due to plants, except food 10/20/2008   Overview:  Dermatitis Due To Contact With Plants   Coronary artery calcification 03/01/2021   Depression, major, single episode, moderate (HCC) 07/28/2022   Dizzy 06/21/2018   Fatigue 11/11/2016   Globus pharyngeus 04/18/2013   Hot flashes 04/26/2019    Hyperlipidemia    Insomnia 03/03/2015   Leukocytes in urine 07/28/2022   Lipoma of neck 11/02/2011   Mid back pain 07/28/2022   Mitral regurgitation 04/07/2021   Myalgia due to statin 03/04/2021   Other headache syndrome 06/21/2018   Preoperative cardiovascular examination 03/01/2021   Preventative health care 01/09/2017   Primary insomnia 12/22/2015   Rheumatoid arthritis (HCC)    RUQ pain 01/06/2021   Statin myopathy 07/27/2022   Upper respiratory tract infection 05/24/2018   Urinary incontinence 03/03/2015    Past Surgical History:  Procedure Laterality Date   ABDOMINOPLASTY     BREAST BIOPSY     CHOLECYSTECTOMY  03/08/2021   LIPOMA EXCISION     neck   PLACEMENT OF BREAST IMPLANTS     RHINOPLASTY     SEPTOPLASTY      Family History  Problem Relation Age of Onset   Pancreatic cancer Mother    Diabetes Mother    Hypertension Father    Heart disease Father    Sudden death Father    Heart attack Father    Gallbladder disease Father    Deep vein thrombosis Father    Heart disease Brother    Heart disease Brother    Diabetes Brother    Diabetes Brother    Heart disease Brother    Colon cancer Brother    Lung cancer Brother    Diabetes Paternal Grandmother    Healthy Son    Healthy Son    Healthy Daughter     Social History   Socioeconomic History  Marital status: Married    Spouse name: Not on file   Number of children: Not on file   Years of education: Not on file   Highest education level: 12th grade  Occupational History   Occupation: retired Nurse, children's-- [employer]  Tobacco Use   Smoking status: Never    Passive exposure: Past   Smokeless tobacco: Never  Vaping Use   Vaping status: Never Used  Substance and Sexual Activity   Alcohol use: Yes    Alcohol/week: 0.0 standard drinks of alcohol    Comment: Occ   Drug use: No   Sexual activity: Not Currently    Partners: Male  Other Topics Concern   Not on file  Social History Narrative    Exercise ---  2x a week to silver sneakers , 45 min yoga  2 days , senior line dancing    Social Drivers of Health   Financial Resource Strain: Low Risk  (01/15/2024)   Overall Financial Resource Strain (CARDIA)    Difficulty of Paying Living Expenses: Not hard at all  Food Insecurity: No Food Insecurity (01/15/2024)   Hunger Vital Sign    Worried About Running Out of Food in the Last Year: Never true    Ran Out of Food in the Last Year: Never true  Transportation Needs: No Transportation Needs (01/15/2024)   PRAPARE - Administrator, Civil Service (Medical): No    Lack of Transportation (Non-Medical): No  Physical Activity: Sufficiently Active (01/15/2024)   Exercise Vital Sign    Days of Exercise per Week: 5 days    Minutes of Exercise per Session: 50 min  Stress: No Stress Concern Present (01/15/2024)   [employer] of Occupational Health - Occupational Stress Questionnaire    Feeling of Stress: Not at all  Social Connections: Moderately Integrated (01/15/2024)   Social Connection and Isolation Panel    Frequency of Communication with Friends and Family: More than three times a week    Frequency of Social Gatherings with Friends and Family: Twice a week    Attends Religious Services: More than 4 times per year    Active Member of [employer] or Organizations: No    Attends Engineer, structural: Not on file    Marital Status: Married  Intimate Partner Violence: Unknown (09/15/2021)   Received from [facility]   HITS    Physically Hurt: Not on file    Insult or Talk Down To: Not on file    Threaten Physical Harm: Not on file    Scream or Curse: Not on file    Outpatient Medications Prior to Visit  Medication Sig Dispense Refill   Ascorbic Acid (VITAMIN C) 1000 MG tablet Take 1,000 mg by mouth daily.     aspirin  EC 81 MG tablet Take 1 tablet (81 mg total) by mouth daily. Swallow whole. 90 tablet 3   b complex vitamins capsule Take 1 capsule by mouth daily.      CALCIUM  PO Take 1 tablet by mouth daily.     Cholecalciferol (D3 PO) Take 1 tablet by mouth daily.     Evolocumab  (REPATHA  SURECLICK) 140 MG/ML SOAJ Inject 140 mg into the skin every 14 (fourteen) days. 6 mL 3   famotidine (PEPCID) 40 MG tablet Take 40 mg by mouth daily.     hydroxychloroquine  (PLAQUENIL ) 200 MG tablet Take 1 tablet (200 mg total) by mouth daily. 90 tablet 0   nitroGLYCERIN  (NITROSTAT ) 0.4 MG SL tablet Place 0.4 mg  under the tongue every 5 (five) minutes as needed for chest pain.     Omega-3 Fatty Acids (FISH OIL) 1200 MG CAPS Take 1,200 mg by mouth daily.     polyethylene glycol (MIRALAX / GLYCOLAX) 17 g packet Take 17 g by mouth daily as needed for mild constipation or moderate constipation.     traZODone  (DESYREL ) 50 MG tablet Take 50 mg by mouth at bedtime.     TURMERIC PO Take 1 tablet by mouth daily.     BIOTIN PO Take 1 tablet by mouth daily. (Patient not taking: Reported on 12/20/2023)     No facility-administered medications prior to visit.    Allergies  Allergen Reactions   Definity  [Perflutren  Lipid Microsphere] Other (See Comments)    Severe abdominal Pain   Lipitor [Atorvastatin ]     myalgias   Pitavastatin      Myalgias    Pravastatin      myalgias    Review of Systems  Constitutional:  Negative for fever and malaise/fatigue.  HENT:  Negative for congestion.   Eyes:  Negative for blurred vision.  Respiratory:  Negative for cough and shortness of breath.   Cardiovascular:  Negative for chest pain, palpitations and leg swelling.  Gastrointestinal:  Negative for abdominal pain, blood in stool, nausea and vomiting.  Genitourinary:  Negative for dysuria and frequency.  Musculoskeletal:  Negative for back pain and falls.  Skin:  Negative for rash.  Neurological:  Negative for dizziness, loss of consciousness and headaches.  Endo/Heme/Allergies:  Negative for environmental allergies.  Psychiatric/Behavioral:  Negative for depression. The patient is not  nervous/anxious.        Objective:    Physical Exam Vitals and nursing note reviewed.  Constitutional:      General: She is not in acute distress.    Appearance: Normal appearance. She is well-developed.  HENT:     Head: Normocephalic and atraumatic.  Eyes:     General: No scleral icterus.       Right eye: No discharge.        Left eye: No discharge.  Cardiovascular:     Rate and Rhythm: Normal rate and regular rhythm.     Heart sounds: No murmur heard. Pulmonary:     Effort: Pulmonary effort is normal. No respiratory distress.     Breath sounds: Normal breath sounds.  Musculoskeletal:        General: Normal range of motion.     Cervical back: Normal range of motion and neck supple.     Right lower leg: No edema.     Left lower leg: No edema.  Skin:    General: Skin is warm and dry.  Neurological:     Mental Status: She is alert and oriented to person, place, and time.  Psychiatric:        Mood and Affect: Mood normal.        Behavior: Behavior normal.        Thought Content: Thought content normal.        Judgment: Judgment normal.     BP 120/70 (BP Location: Left Arm, Patient Position: Sitting, Cuff Size: Normal)   Pulse 70   Temp 98.1 F (36.7 C) (Oral)   Resp 18   Ht 4' 11 (1.499 m)   Wt 129 lb 9.6 oz (58.8 kg)   SpO2 100%   BMI 26.18 kg/m  Wt Readings from Last 3 Encounters:  01/15/24 129 lb 9.6 oz (58.8 kg)  12/20/23 127 lb (57.6  kg)  09/20/23 130 lb (59 kg)    Diabetic Foot Exam - Simple   No data filed    Lab Results  Component Value Date   WBC 4.4 06/22/2023   HGB 13.5 06/22/2023   HCT 40.0 06/22/2023   PLT 249.0 06/22/2023   GLUCOSE 94 10/12/2023   CHOL 197 10/12/2023   TRIG 73 10/12/2023   HDL 78 10/12/2023   LDLCALC 106 (H) 10/12/2023   ALT 15 10/12/2023   AST 21 10/12/2023   NA 143 10/12/2023   K 5.2 10/12/2023   CL 106 10/12/2023   CREATININE 0.85 10/12/2023   BUN 25 10/12/2023   CO2 25 10/12/2023   TSH 1.41 06/22/2023    HGBA1C 5.2 07/28/2022    Lab Results  Component Value Date   TSH 1.41 06/22/2023   Lab Results  Component Value Date   WBC 4.4 06/22/2023   HGB 13.5 06/22/2023   HCT 40.0 06/22/2023   MCV 97.4 06/22/2023   PLT 249.0 06/22/2023   Lab Results  Component Value Date   NA 143 10/12/2023   K 5.2 10/12/2023   CO2 25 10/12/2023   GLUCOSE 94 10/12/2023   BUN 25 10/12/2023   CREATININE 0.85 10/12/2023   BILITOT 0.8 10/12/2023   ALKPHOS 60 10/12/2023   AST 21 10/12/2023   ALT 15 10/12/2023   PROT 6.8 10/12/2023   ALBUMIN 4.6 10/12/2023   CALCIUM  10.0 10/12/2023   EGFR 71 10/12/2023   GFR 86.36 06/22/2023   Lab Results  Component Value Date   CHOL 197 10/12/2023   Lab Results  Component Value Date   HDL 78 10/12/2023   Lab Results  Component Value Date   LDLCALC 106 (H) 10/12/2023   Lab Results  Component Value Date   TRIG 73 10/12/2023   Lab Results  Component Value Date   CHOLHDL 2.5 10/12/2023   Lab Results  Component Value Date   HGBA1C 5.2 07/28/2022       Assessment & Plan:  Urinary frequency -     POCT Urinalysis Dipstick (Automated)  RUQ pain -     CBC with Differential/Platelet -     Comprehensive metabolic panel with GFR -     Ambulatory referral to Gastroenterology -     US  ABDOMEN LIMITED RUQ (LIVER/GB); Future  Leg cramps -     Comprehensive metabolic panel with GFR -     Magnesium  Gastroesophageal reflux disease, unspecified whether esophagitis present -     Ambulatory referral to Gastroenterology  Dyspepsia -     Pantoprazole  Sodium; Take 1 tablet (40 mg total) by mouth daily.  Dispense: 30 tablet; Refill: 3  Assessment and Plan Assessment & Plan Abdominal pain after cholecystectomy   She experiences intermittent abdominal pain in the same location as previous gallbladder pain, mostly in the afternoons and after meals. Differential diagnosis includes retained bile duct stones or other post-cholecystectomy complications. Normal  liver function tests from May rule out liver dysfunction. The pain is unpredictable, often accompanied by bloating. Order an abdominal ultrasound and blood work to assess liver function and check for infection. Refer to a GI specialist if the ultrasound is normal.  Lower extremity pain/aches   She reports intermittent achy pain in the legs later in the day after morning walks. Magnesium deficiency is considered a potential cause. Recommend magnesium supplementation at 200 mg daily and order blood work to check magnesium levels.  Increased urinary frequency   She has increased frequency of urination  without incontinence or dysuria. No specific cause is identified. Order a urine analysis.  Heartburn   She experiences occasional heartburn, previously managed with famotidine. Considering pantoprazole  as an alternative treatment option, especially with upcoming travel plans. She has taken pantoprazole  before her gallbladder removal. Prescribe pantoprazole  and continue famotidine if needed.    Lenola Lockner R Lowne Chase, DO

## 2024-01-16 ENCOUNTER — Ambulatory Visit: Payer: Self-pay | Admitting: Family Medicine

## 2024-01-16 ENCOUNTER — Ambulatory Visit (HOSPITAL_BASED_OUTPATIENT_CLINIC_OR_DEPARTMENT_OTHER)
Admission: RE | Admit: 2024-01-16 | Discharge: 2024-01-16 | Disposition: A | Discharge status: Home / Self Care | Source: Ambulatory Visit | Attending: Family Medicine | Admitting: Family Medicine

## 2024-01-16 DIAGNOSIS — R1011 Right upper quadrant pain: Secondary | ICD-10-CM | POA: Diagnosis not present

## 2024-01-16 DIAGNOSIS — Z9049 Acquired absence of other specified parts of digestive tract: Secondary | ICD-10-CM | POA: Diagnosis not present

## 2024-01-16 DIAGNOSIS — R935 Abnormal findings on diagnostic imaging of other abdominal regions, including retroperitoneum: Secondary | ICD-10-CM | POA: Diagnosis not present

## 2024-01-16 DIAGNOSIS — E782 Mixed hyperlipidemia: Secondary | ICD-10-CM | POA: Diagnosis not present

## 2024-01-16 DIAGNOSIS — I2510 Atherosclerotic heart disease of native coronary artery without angina pectoris: Secondary | ICD-10-CM | POA: Diagnosis not present

## 2024-01-16 LAB — CBC WITH DIFFERENTIAL/PLATELET
Basophils Absolute: 0 K/uL (ref 0.0–0.1)
Basophils Relative: 0.6 % (ref 0.0–3.0)
Eosinophils Absolute: 0.1 K/uL (ref 0.0–0.7)
Eosinophils Relative: 1.4 % (ref 0.0–5.0)
HCT: 39.2 % (ref 36.0–46.0)
Hemoglobin: 13.1 g/dL (ref 12.0–15.0)
Lymphocytes Relative: 21.5 % (ref 12.0–46.0)
Lymphs Abs: 1.2 K/uL (ref 0.7–4.0)
MCHC: 33.5 g/dL (ref 30.0–36.0)
MCV: 94.8 fl (ref 78.0–100.0)
Monocytes Absolute: 0.5 K/uL (ref 0.1–1.0)
Monocytes Relative: 8.2 % (ref 3.0–12.0)
Neutro Abs: 3.8 K/uL (ref 1.4–7.7)
Neutrophils Relative %: 68.3 % (ref 43.0–77.0)
Platelets: 240 K/uL (ref 150.0–400.0)
RBC: 4.13 Mil/uL (ref 3.87–5.11)
RDW: 12.7 % (ref 11.5–15.5)
WBC: 5.6 K/uL (ref 4.0–10.5)

## 2024-01-16 LAB — COMPREHENSIVE METABOLIC PANEL WITH GFR
ALT: 15 U/L (ref 0–35)
AST: 19 U/L (ref 0–37)
Albumin: 4.3 g/dL (ref 3.5–5.2)
Alkaline Phosphatase: 49 U/L (ref 39–117)
BUN: 26 mg/dL — ABNORMAL HIGH (ref 6–23)
CO2: 28 meq/L (ref 19–32)
Calcium: 9.5 mg/dL (ref 8.4–10.5)
Chloride: 106 meq/L (ref 96–112)
Creatinine, Ser: 0.68 mg/dL (ref 0.40–1.20)
GFR: 84.12 mL/min (ref >60.00)
Glucose, Bld: 100 mg/dL — ABNORMAL HIGH (ref 70–99)
Potassium: 4.4 meq/L (ref 3.5–5.1)
Sodium: 141 meq/L (ref 135–145)
Total Bilirubin: 0.7 mg/dL (ref 0.2–1.2)
Total Protein: 6.5 g/dL (ref 6.0–8.3)

## 2024-01-16 LAB — MAGNESIUM: Magnesium: 2.1 mg/dL (ref 1.5–2.5)

## 2024-01-17 ENCOUNTER — Ambulatory Visit: Payer: Self-pay | Admitting: Cardiology

## 2024-01-17 DIAGNOSIS — I2510 Atherosclerotic heart disease of native coronary artery without angina pectoris: Secondary | ICD-10-CM

## 2024-01-17 DIAGNOSIS — E782 Mixed hyperlipidemia: Secondary | ICD-10-CM

## 2024-01-17 LAB — HEPATIC FUNCTION PANEL
ALT: 15 IU/L (ref 0–32)
AST: 19 IU/L (ref 0–40)
Albumin: 4.4 g/dL (ref 3.8–4.8)
Alkaline Phosphatase: 55 IU/L (ref 44–121)
Bilirubin Total: 0.7 mg/dL (ref 0.0–1.2)
Bilirubin, Direct: 0.22 mg/dL (ref 0.00–0.40)
Total Protein: 6.5 g/dL (ref 6.0–8.5)

## 2024-01-17 LAB — LIPID PANEL
Chol/HDL Ratio: 2.5 ratio (ref 0.0–4.4)
Cholesterol, Total: 193 mg/dL (ref 100–199)
HDL: 77 mg/dL (ref >39)
LDL Chol Calc (NIH): 104 mg/dL — ABNORMAL HIGH (ref 0–99)
Triglycerides: 67 mg/dL (ref 0–149)
VLDL Cholesterol Cal: 12 mg/dL (ref 5–40)

## 2024-01-18 ENCOUNTER — Other Ambulatory Visit: Payer: Self-pay

## 2024-01-18 ENCOUNTER — Encounter: Payer: Self-pay | Admitting: Family Medicine

## 2024-01-18 DIAGNOSIS — R1011 Right upper quadrant pain: Secondary | ICD-10-CM

## 2024-01-23 ENCOUNTER — Encounter: Payer: Self-pay | Admitting: Family Medicine

## 2024-01-24 ENCOUNTER — Other Ambulatory Visit: Payer: Self-pay | Admitting: Family Medicine

## 2024-01-24 DIAGNOSIS — I2510 Atherosclerotic heart disease of native coronary artery without angina pectoris: Secondary | ICD-10-CM

## 2024-01-24 DIAGNOSIS — E663 Overweight: Secondary | ICD-10-CM

## 2024-01-24 DIAGNOSIS — M069 Rheumatoid arthritis, unspecified: Secondary | ICD-10-CM

## 2024-01-24 MED ORDER — WEGOVY 0.25 MG/0.5ML ~~LOC~~ SOAJ: 0.2500 mg | SUBCUTANEOUS | 0 refills | Status: DC

## 2024-01-30 ENCOUNTER — Encounter: Payer: Self-pay | Admitting: Family Medicine

## 2024-01-30 ENCOUNTER — Ambulatory Visit: Payer: Self-pay

## 2024-01-30 NOTE — Telephone Encounter (Signed)
  FYI Only or Action Required?: FYI only for provider.  Patient was last seen in primary care on 01/15/2024 by Antonio Meth, Jamee SAUNDERS, DO.  Called Nurse Triage reporting urinary symptoms.  Symptoms began yesterday.  Interventions attempted: Rest, hydration, or home remedies.  Symptoms are: gradually worsening.  Triage Disposition: See Physician Within 24 Hours  Patient/caregiver understands and will follow disposition?: Yes   Copied from CRM #[id number]. Topic: Clinical - Red Word Triage >> Jan 30, 2024 10:39 AM Chiquita SQUIBB wrote: Red Word that prompted transfer to Nurse Triage: Patient is calling in with burning when urinating, and also constant burning. Reason for Disposition  Urinating more frequently than usual (i.e., frequency) OR new-onset of the feeling of an urgent need to urinate (i.e., urgency)  Answer Assessment - Initial Assessment Questions 1. SYMPTOM: What's the main symptom you're concerned about? (e.g., frequency, incontinence)     Burning with urination 2. ONSET: When did the  burning  start?     One day ago 3. PAIN: Is there any pain? If Yes, ask: How bad is it? (Scale: 1-10; mild, moderate, severe)     No pain aside from burning 4. CAUSE: What do you think is causing the symptoms?     UTI 5. OTHER SYMPTOMS: Do you have any other symptoms? (e.g., blood in urine, fever, flank pain, pain with urination)     denies 6. PREGNANCY: Is there any chance you are pregnant? When was your last menstrual period?     N/A  Protocols used: Urinary Symptoms-A-AH

## 2024-01-30 NOTE — Telephone Encounter (Signed)
 Appt scheduled

## 2024-01-31 ENCOUNTER — Other Ambulatory Visit: Payer: Self-pay

## 2024-01-31 ENCOUNTER — Encounter: Payer: Self-pay | Admitting: Family Medicine

## 2024-01-31 ENCOUNTER — Ambulatory Visit (INDEPENDENT_AMBULATORY_CARE_PROVIDER_SITE_OTHER): Admitting: Family Medicine

## 2024-01-31 VITALS — BP 110/72 | HR 77 | Temp 97.6°F | Resp 16 | Ht 59.0 in | Wt 128.0 lb

## 2024-01-31 DIAGNOSIS — R1011 Right upper quadrant pain: Secondary | ICD-10-CM

## 2024-01-31 DIAGNOSIS — N3090 Cystitis, unspecified without hematuria: Secondary | ICD-10-CM

## 2024-01-31 LAB — POCT URINALYSIS DIPSTICK
Glucose, UA: NEGATIVE
Protein, UA: NEGATIVE
Spec Grav, UA: 1.01 (ref 1.010–1.025)
Urobilinogen, UA: 0.2 U/dL
pH, UA: 5 (ref 5.0–8.0)

## 2024-01-31 MED ORDER — DICYCLOMINE HCL 10 MG PO CAPS: ORAL_CAPSULE | ORAL | 0 refills | Status: DC

## 2024-01-31 MED ORDER — AMITRIPTYLINE HCL 10 MG PO TABS: 10.0000 mg | ORAL_TABLET | Freq: Every day | ORAL | 1 refills | Status: DC

## 2024-01-31 MED ORDER — CEPHALEXIN 500 MG PO CAPS: 500.0000 mg | ORAL_CAPSULE | Freq: Three times a day (TID) | ORAL | 0 refills | Status: AC

## 2024-01-31 NOTE — Patient Instructions (Addendum)
Stay hydrated.   Warning signs/symptoms: Uncontrollable nausea/vomiting, fevers, worsening symptoms despite treatment, confusion.  Give [place] around 2 business days to get culture back to you.  If you do not hear anything about your referral in the next 1-2 weeks, call our office and ask for an update.  Let us know if you need anything.

## 2024-01-31 NOTE — Progress Notes (Signed)
 Chief Complaint  Patient presents with   Dysuria    Dysuria     Stephanie Lara is a 78 y.o. female here for possible UTI.  Duration: 2 days. Symptoms: Dysuria, urinary frequency, hematuria, and nausea Denies: urinary hesitancy, no abdominal pain, urinary retention, fever, vomiting, urgency, flank pain, vaginal discharge Hx of recurrent UTI? No Denies new sexual partners.  Patient has a 2-year history of right upper abdominal pain.  She had her gallbladder out which did not help.  Sometimes has nausea.  Described as a squeezing ache.  Seems to be relatively constant.  Eating makes it worse.  She does have high stress levels.  Bowel movements lean towards constipation.  There is no bleeding or unintentional weight loss.  She is requesting a referral to the gastroenterology team.  She is up-to-date with colon cancer screening and historically scopes have been unremarkable.  Past Medical History:  Diagnosis Date   Abnormal biliary HIDA scan 02/25/2021   Chest pain 01/06/2021   Chicken pox    Contact dermatitis due to plants, except food 10/20/2008   Overview:  Dermatitis Due To Contact With Plants   Coronary artery calcification 03/01/2021   Depression, major, single episode, moderate (HCC) 07/28/2022   Dizzy 06/21/2018   Fatigue 11/11/2016   Globus pharyngeus 04/18/2013   Hot flashes 04/26/2019   Hyperlipidemia    Insomnia 03/03/2015   Leukocytes in urine 07/28/2022   Lipoma of neck 11/02/2011   Mid back pain 07/28/2022   Mitral regurgitation 04/07/2021   Myalgia due to statin 03/04/2021   Other headache syndrome 06/21/2018   Preoperative cardiovascular examination 03/01/2021   Preventative health care 01/09/2017   Primary insomnia 12/22/2015   Rheumatoid arthritis (HCC)    RUQ pain 01/06/2021   Statin myopathy 07/27/2022   Upper respiratory tract infection 05/24/2018   Urinary incontinence 03/03/2015     BP 110/72 (BP Location: Left Arm, Patient Position: Sitting)    Pulse 77   Temp 97.6 F (36.4 C) (Oral)   Resp 16   Ht 4' 11 (1.499 m)   Wt 128 lb (58.1 kg)   SpO2 96%   BMI 25.85 kg/m  General: Awake, alert, appears stated age Heart: RRR Lungs: CTAB, normal respiratory effort, no accessory muscle usage Abd: BS+, soft, mild ttp in upper quadrants, worse on R, ND, no masses or organomegaly MSK: No CVA tenderness, neg Lloyd's sign Psych: Age appropriate judgment and insight  Cystitis - Plan: Urine Culture, POCT Urinalysis Dipstick, cephALEXin  (KEFLEX ) 500 MG capsule  RUQ pain - Plan: Ambulatory referral to Gastroenterology, amitriptyline  (ELAVIL ) 10 MG tablet, dicyclomine  (BENTYL ) 10 MG capsule  UA is suggestive of infection.  7 days of Keflex  sent in.  Stay hydrated. Seek immediate care if pt starts to develop fevers, new/worsening symptoms, uncontrollable N/V. Chronic, not controlled.  Ref[facility] at [facility].  Start amitriptyline  10 mg nightly, Bentyl  10 mg 3 times daily as needed. F/u in 1 mo w Dr. Antonio.  Okay to cancel if she is in with GI in a timely fashion.  She has an international trip coming up in a couple months and would like this addressed prior to then. The patient voiced understanding and agreement to the plan.  Mabel [place], DO 01/31/24 12:09 PM

## 2024-02-02 ENCOUNTER — Encounter: Payer: Self-pay | Admitting: Family Medicine

## 2024-02-03 LAB — URINE CULTURE
MICRO NUMBER:: 16858453
SPECIMEN QUALITY:: ADEQUATE

## 2024-02-04 ENCOUNTER — Ambulatory Visit: Payer: Self-pay | Admitting: Family Medicine

## 2024-02-04 DIAGNOSIS — R1011 Right upper quadrant pain: Secondary | ICD-10-CM

## 2024-02-08 DIAGNOSIS — Z961 Presence of intraocular lens: Secondary | ICD-10-CM | POA: Diagnosis not present

## 2024-02-08 DIAGNOSIS — Z79899 Other long term (current) drug therapy: Secondary | ICD-10-CM | POA: Diagnosis not present

## 2024-02-08 DIAGNOSIS — M069 Rheumatoid arthritis, unspecified: Secondary | ICD-10-CM | POA: Diagnosis not present

## 2024-02-13 MED ORDER — DICYCLOMINE HCL 10 MG PO CAPS: ORAL_CAPSULE | ORAL | 0 refills | Status: AC

## 2024-02-18 DIAGNOSIS — Z6824 Body mass index (BMI) 24.0-24.9, adult: Secondary | ICD-10-CM | POA: Diagnosis not present

## 2024-02-18 DIAGNOSIS — R0981 Nasal congestion: Secondary | ICD-10-CM | POA: Diagnosis not present

## 2024-02-18 DIAGNOSIS — E785 Hyperlipidemia, unspecified: Secondary | ICD-10-CM | POA: Diagnosis not present

## 2024-02-18 DIAGNOSIS — J00 Acute nasopharyngitis [common cold]: Secondary | ICD-10-CM | POA: Diagnosis not present

## 2024-03-03 ENCOUNTER — Other Ambulatory Visit: Payer: Self-pay | Admitting: Internal Medicine

## 2024-03-03 DIAGNOSIS — M0579 Rheumatoid arthritis with rheumatoid factor of multiple sites without organ or systems involvement: Secondary | ICD-10-CM

## 2024-03-04 ENCOUNTER — Encounter: Payer: Self-pay | Admitting: Family Medicine

## 2024-03-04 ENCOUNTER — Ambulatory Visit (INDEPENDENT_AMBULATORY_CARE_PROVIDER_SITE_OTHER): Admitting: Family Medicine

## 2024-03-04 VITALS — BP 120/84 | HR 67 | Temp 97.8°F | Resp 16 | Ht 59.0 in | Wt 125.0 lb

## 2024-03-04 DIAGNOSIS — R1011 Right upper quadrant pain: Secondary | ICD-10-CM

## 2024-03-04 DIAGNOSIS — N3090 Cystitis, unspecified without hematuria: Secondary | ICD-10-CM | POA: Diagnosis not present

## 2024-03-04 NOTE — Telephone Encounter (Signed)
 Last Fill: 12/20/2023  Eye exam: baseline eye exam is not on file.    Labs: 01/15/2024 CBC and CMP: glucose 100, BUN 26 01/16/2024 Hepatic function panel WNL   Next Visit: 06/24/2024  Last Visit: 12/20/2023  IK:Myzlfjunpi arthritis involving multiple sites with positive rheumatoid factor   Current Dose per office note on 12/20/2023: Continue/resume hydroxychloroquine  200 mg daily   Attempted to contact patient and left message to check status of PLQ eye exam.   Okay to refill Plaquenil ?

## 2024-03-04 NOTE — Progress Notes (Signed)
 Subjective:    Patient ID: Stephanie Lara, female    DOB: 1946-05-12, 78 y.o.   MRN: [id number]     Chief Complaint  Patient presents with   Follow-up    HPI Patient is in today for f/u bentyl  med.  Discussed the use of AI scribe software for clinical note transcription with the patient, who gave verbal consent to proceed.  History of Present Illness Stephanie Lara is a 78 year old female who presents for follow-up regarding her stomach issues.  She has experienced significant improvement in her stomach symptoms after starting a new medication. She has also eliminated calcium , magnesium, fish oil, and turmeric from her regimen, which she believes may have contributed to her symptoms. Her stomach has been improving steadily, and she has not needed the new medication in the last two weeks.  She recently traveled to [place]  to visit her siblings and maintained her improved condition despite being away from her usual routine. She plans to attend a gastrointestinal appointment on October 2nd before her upcoming trip on October 15th.  She has a history of urinary issues, previously treated with Keflex , and reports that these symptoms have resolved.  She confirms having enough Bentyl , which was previously prescribed, to manage any potential stomach issues during her travels.    Past Medical History:  Diagnosis Date   Abnormal biliary HIDA scan 02/25/2021   Chest pain 01/06/2021   Chicken pox    Contact dermatitis due to plants, except food 10/20/2008   Overview:  Dermatitis Due To Contact With Plants   Coronary artery calcification 03/01/2021   Depression, major, single episode, moderate (HCC) 07/28/2022   Dizzy 06/21/2018   Fatigue 11/11/2016   Globus pharyngeus 04/18/2013   Hot flashes 04/26/2019   Hyperlipidemia    Insomnia 03/03/2015   Leukocytes in urine 07/28/2022   Lipoma of neck 11/02/2011   Mid back pain 07/28/2022   Mitral regurgitation 04/07/2021   Myalgia due to  statin 03/04/2021   Other headache syndrome 06/21/2018   Preoperative cardiovascular examination 03/01/2021   Preventative health care 01/09/2017   Primary insomnia 12/22/2015   Rheumatoid arthritis (HCC)    RUQ pain 01/06/2021   Statin myopathy 07/27/2022   Upper respiratory tract infection 05/24/2018   Urinary incontinence 03/03/2015    Past Surgical History:  Procedure Laterality Date   ABDOMINOPLASTY     BREAST BIOPSY     CHOLECYSTECTOMY  03/08/2021   LIPOMA EXCISION     neck   PLACEMENT OF BREAST IMPLANTS     RHINOPLASTY     SEPTOPLASTY      Family History  Problem Relation Age of Onset   Pancreatic cancer Mother    Diabetes Mother    Hypertension Father    Heart disease Father    Sudden death Father    Heart attack Father    Gallbladder disease Father    Deep vein thrombosis Father    Heart disease Brother    Heart disease Brother    Diabetes Brother    Diabetes Brother    Heart disease Brother    Colon cancer Brother    Lung cancer Brother    Diabetes Paternal Grandmother    Healthy Son    Healthy Son    Healthy Daughter     Social History   Socioeconomic History   Marital status: Married    Spouse name: Not on file   Number of children: Not on file   Years of education: Not on file  Highest education level: 12th grade  Occupational History   Occupation: retired Nurse, children's-- [employer]  Tobacco Use   Smoking status: Never    Passive exposure: Past   Smokeless tobacco: Never  Vaping Use   Vaping status: Never Used  Substance and Sexual Activity   Alcohol use: Yes    Alcohol/week: 0.0 standard drinks of alcohol    Comment: Occ   Drug use: No   Sexual activity: Not Currently    Partners: Male  Other Topics Concern   Not on file  Social History Narrative   Exercise ---  2x a week to silver sneakers , 45 min yoga  2 days , senior line dancing    Social Drivers of Health   Financial Resource Strain: Low Risk  (01/15/2024)   Overall  Financial Resource Strain (CARDIA)    Difficulty of Paying Living Expenses: Not hard at all  Food Insecurity: No Food Insecurity (01/15/2024)   Hunger Vital Sign    Worried About Running Out of Food in the Last Year: Never true    Ran Out of Food in the Last Year: Never true  Transportation Needs: No Transportation Needs (01/15/2024)   PRAPARE - Administrator, Civil Service (Medical): No    Lack of Transportation (Non-Medical): No  Physical Activity: Sufficiently Active (01/15/2024)   Exercise Vital Sign    Days of Exercise per Week: 5 days    Minutes of Exercise per Session: 50 min  Stress: No Stress Concern Present (01/15/2024)   [employer] of Occupational Health - Occupational Stress Questionnaire    Feeling of Stress: Not at all  Social Connections: Moderately Integrated (01/15/2024)   Social Connection and Isolation Panel    Frequency of Communication with Friends and Family: More than three times a week    Frequency of Social Gatherings with Friends and Family: Twice a week    Attends Religious Services: More than 4 times per year    Active Member of [employer] or Organizations: No    Attends Engineer, structural: Not on file    Marital Status: Married  Intimate Partner Violence: Unknown (09/15/2021)   Received from [facility]   HITS    Physically Hurt: Not on file    Insult or Talk Down To: Not on file    Threaten Physical Harm: Not on file    Scream or Curse: Not on file    Outpatient Medications Prior to Visit  Medication Sig Dispense Refill   Ascorbic Acid (VITAMIN C) 1000 MG tablet Take 1,000 mg by mouth daily.     aspirin  EC 81 MG tablet Take 1 tablet (81 mg total) by mouth daily. Swallow whole. 90 tablet 3   b complex vitamins capsule Take 1 capsule by mouth daily.     Cholecalciferol (D3 PO) Take 1 tablet by mouth daily.     dicyclomine  (BENTYL ) 10 MG capsule Take 1 tab every 6 hours as needed for abdominal cramping. 60 capsule 0   Evolocumab   (REPATHA  SURECLICK) 140 MG/ML SOAJ Inject 140 mg into the skin every 14 (fourteen) days. 6 mL 3   famotidine (PEPCID) 40 MG tablet Take 40 mg by mouth daily.     hydroxychloroquine  (PLAQUENIL ) 200 MG tablet Take 1 tablet (200 mg total) by mouth daily. 90 tablet 0   nitroGLYCERIN  (NITROSTAT ) 0.4 MG SL tablet Place 0.4 mg under the tongue every 5 (five) minutes as needed for chest pain.     pantoprazole  (PROTONIX ) 40 MG  tablet Take 1 tablet (40 mg total) by mouth daily. 30 tablet 3   polyethylene glycol (MIRALAX / GLYCOLAX) 17 g packet Take 17 g by mouth daily as needed for mild constipation or moderate constipation.     traZODone  (DESYREL ) 50 MG tablet Take 50 mg by mouth at bedtime.     amitriptyline  (ELAVIL ) 10 MG tablet Take 1 tablet (10 mg total) by mouth at bedtime. 30 tablet 1   CALCIUM  PO Take 1 tablet by mouth daily.     Omega-3 Fatty Acids (FISH OIL) 1200 MG CAPS Take 1,200 mg by mouth daily.     TURMERIC PO Take 1 tablet by mouth daily.     No facility-administered medications prior to visit.    Allergies  Allergen Reactions   Definity  [Perflutren  Lipid Microsphere] Other (See Comments)    Severe abdominal Pain   Lipitor [Atorvastatin ]     myalgias   Pitavastatin      Myalgias    Pravastatin      myalgias    Review of Systems  Constitutional:  Negative for chills, fever and malaise/fatigue.  HENT:  Negative for congestion and hearing loss.   Eyes:  Negative for blurred vision and discharge.  Respiratory:  Negative for cough, sputum production and shortness of breath.   Cardiovascular:  Negative for chest pain, palpitations and leg swelling.  Gastrointestinal:  Negative for abdominal pain, blood in stool, constipation, diarrhea, heartburn, nausea and vomiting.  Genitourinary:  Negative for dysuria, frequency, hematuria and urgency.  Musculoskeletal:  Negative for back pain, falls and myalgias.  Skin:  Negative for rash.  Neurological:  Negative for dizziness, sensory  change, loss of consciousness, weakness and headaches.  Endo/Heme/Allergies:  Negative for environmental allergies. Does not bruise/bleed easily.  Psychiatric/Behavioral:  Negative for depression and suicidal ideas. The patient is not nervous/anxious and does not have insomnia.        Objective:    Physical Exam Vitals and nursing note reviewed.  Constitutional:      General: She is not in acute distress.    Appearance: Normal appearance. She is well-developed.  HENT:     Head: Normocephalic and atraumatic.  Eyes:     General: No scleral icterus.       Right eye: No discharge.        Left eye: No discharge.  Cardiovascular:     Rate and Rhythm: Normal rate and regular rhythm.     Heart sounds: No murmur heard. Pulmonary:     Effort: Pulmonary effort is normal. No respiratory distress.     Breath sounds: Normal breath sounds.  Abdominal:     Palpations: Abdomen is soft.     Tenderness: There is no abdominal tenderness. There is no guarding or rebound.  Musculoskeletal:        General: Normal range of motion.     Cervical back: Normal range of motion and neck supple.     Right lower leg: No edema.     Left lower leg: No edema.  Skin:    General: Skin is warm and dry.  Neurological:     Mental Status: She is alert and oriented to person, place, and time.  Psychiatric:        Mood and Affect: Mood normal.        Behavior: Behavior normal.        Thought Content: Thought content normal.        Judgment: Judgment normal.     BP 120/84 (BP Location: Left  Arm, Patient Position: Sitting, Cuff Size: Normal)   Pulse 67   Temp 97.8 F (36.6 C) (Oral)   Resp 16   Ht 4' 11 (1.499 m)   Wt 125 lb (56.7 kg)   SpO2 99%   BMI 25.25 kg/m  Wt Readings from Last 3 Encounters:  03/04/24 125 lb (56.7 kg)  01/31/24 128 lb (58.1 kg)  01/15/24 129 lb 9.6 oz (58.8 kg)    Diabetic Foot Exam - Simple   No data filed    Lab Results  Component Value Date   WBC 5.6 01/15/2024    HGB 13.1 01/15/2024   HCT 39.2 01/15/2024   PLT 240.0 01/15/2024   GLUCOSE 100 (H) 01/15/2024   CHOL 193 01/16/2024   TRIG 67 01/16/2024   HDL 77 01/16/2024   LDLCALC 104 (H) 01/16/2024   ALT 15 01/16/2024   AST 19 01/16/2024   NA 141 01/15/2024   K 4.4 01/15/2024   CL 106 01/15/2024   CREATININE 0.68 01/15/2024   BUN 26 (H) 01/15/2024   CO2 28 01/15/2024   TSH 1.41 06/22/2023   HGBA1C 5.2 07/28/2022    Lab Results  Component Value Date   TSH 1.41 06/22/2023   Lab Results  Component Value Date   WBC 5.6 01/15/2024   HGB 13.1 01/15/2024   HCT 39.2 01/15/2024   MCV 94.8 01/15/2024   PLT 240.0 01/15/2024   Lab Results  Component Value Date   NA 141 01/15/2024   K 4.4 01/15/2024   CO2 28 01/15/2024   GLUCOSE 100 (H) 01/15/2024   BUN 26 (H) 01/15/2024   CREATININE 0.68 01/15/2024   BILITOT 0.7 01/16/2024   ALKPHOS 55 01/16/2024   AST 19 01/16/2024   ALT 15 01/16/2024   PROT 6.5 01/16/2024   ALBUMIN 4.4 01/16/2024   CALCIUM  9.5 01/15/2024   EGFR 71 10/12/2023   GFR 84.12 01/15/2024   Lab Results  Component Value Date   CHOL 193 01/16/2024   Lab Results  Component Value Date   HDL 77 01/16/2024   Lab Results  Component Value Date   LDLCALC 104 (H) 01/16/2024   Lab Results  Component Value Date   TRIG 67 01/16/2024   Lab Results  Component Value Date   CHOLHDL 2.5 01/16/2024   Lab Results  Component Value Date   HGBA1C 5.2 07/28/2022       Assessment & Plan:  RUQ pain Assessment & Plan: Resolved when she stopped all of her supplements-- fish oil tumeric, etc    Cystitis Assessment & Plan: Resolved    Assessment and Plan Assessment & Plan Gastrointestinal symptoms   Her gastrointestinal symptoms have significantly improved after stopping calcium , magnesium, fish oil, and turmeric supplements. Symptoms remain stable, and she has not needed new medication in the past two weeks. She will attend a GI appointment on October 2nd for further  evaluation before her trip. Continue avoiding the supplements. Ensure a sufficient supply of Bentyl  for use as needed.  General Health Maintenance   Preventive measures for COVID-19 and other respiratory illnesses during travel were discussed. She should wear a mask in crowded situations, especially on flights. Consider using Astapro nasal spray daily as a to help with congestion if needed. Use over-the-counter medications such as Mucinex, Flonase , Tylenol, and ibuprofen  for symptom management if needed.    Jeannia Tatro R Lowne Chase, DO

## 2024-03-04 NOTE — Patient Instructions (Signed)
 Diet for Irritable Bowel Syndrome When you have irritable bowel syndrome (IBS), it is very important to follow the eating habits that are best for your condition. IBS may cause various symptoms, such as pain in the abdomen, constipation, or diarrhea. Choosing the right foods can help to ease the discomfort from these symptoms. Work with your health care provider and dietitian to find the eating plan that will help to control your symptoms. What are tips for following this plan?  Keep a food diary. This will help you identify foods that cause symptoms. Write down: What you eat and when you eat it. What symptoms you have. When symptoms occur in relation to your meals, such as "pain in abdomen 2 hours after dinner." Eat your meals slowly and in a relaxed setting. Aim to eat 5-6 small meals per day. Do not skip meals. Drink enough fluid to keep your urine pale yellow. Ask your health care provider if you should take an over-the-counter probiotic to help restore healthy bacteria in your gut (digestive tract). Probiotics are foods that contain good bacteria and yeasts. Your dietitian may have specific dietary recommendations for you based on your symptoms. Your dietitian may recommend that you: Avoid foods that cause symptoms. Talk with your dietitian about other ways to get the same nutrients that are in those problem foods. Avoid foods with gluten. Gluten is a protein that is found in rye, wheat, and barley. Eat more foods that contain soluble fiber. Examples of foods with high soluble fiber include oats, seeds, and certain fruits and vegetables. Take a fiber supplement if told by your dietitian. Reduce or avoid certain foods called FODMAPs. These are foods that contain sugars that are hard for some people to digest. Ask your health care provider which foods to avoid. What foods should I avoid? The following are some foods and drinks that may make your symptoms worse: Fatty foods, such as french  fries. Foods that contain gluten, such as pasta and cereal. Dairy products, such as milk, cheese, and ice cream. Spicy foods. Alcohol. Products with caffeine, such as coffee, tea, or chocolate. Carbonated drinks, such as soda. Foods that are high in FODMAPs. These include certain fruits and vegetables. Products with sweeteners such as honey, high fructose corn syrup, sorbitol, and mannitol. The items listed above may not be a complete list of foods and beverages you should avoid. Contact a dietitian for more information. What foods are good sources of fiber? Your health care provider or dietitian may recommend that you eat more foods that contain fiber. Fiber can help to reduce constipation and other IBS symptoms. Add foods with fiber to your diet a little at a time so your body can get used to them. Too much fiber at one time might cause gas and swelling of your abdomen. The following are some foods that are good sources of fiber: Berries, such as raspberries, strawberries, and blueberries. Tomatoes. Carrots. Brown rice. Oats. Seeds, such as chia and pumpkin seeds. The items listed above may not be a complete list of recommended sources of fiber. Contact your dietitian for more options. Where to find more information International Foundation for Functional Gastrointestinal Disorders: aboutibs.[employer] of Diabetes and Digestive and Kidney Diseases: [employer]Sync.si Summary When you have irritable bowel syndrome (IBS), it is very important to follow the eating habits that are best for your condition. IBS may cause various symptoms, such as pain in the abdomen, constipation, or diarrhea. Choosing the right foods can help to ease the  discomfort that comes from symptoms. Your health care provider or dietitian may recommend that you eat more foods that contain fiber. Keep a food diary. This will help you identify foods that cause symptoms. This information is not intended to replace  advice given to you by your health care provider. Make sure you discuss any questions you have with your health care provider. Document Revised: 05/11/2021 Document Reviewed: 05/11/2021 Elsevier Patient Education  2024 [employer].

## 2024-03-04 NOTE — Assessment & Plan Note (Signed)
 Resolved

## 2024-03-04 NOTE — Assessment & Plan Note (Signed)
 Resolved when she stopped all of her supplements-- fish oil tumeric, etc

## 2024-03-04 NOTE — Telephone Encounter (Signed)
 Patient returned call to the office and states she had her PLQ eye exam a couple of weeks ago. Patient will contact the eye doctor to have them fax the results.

## 2024-03-14 ENCOUNTER — Encounter: Payer: Self-pay | Admitting: Pharmacist Clinician (PhC)/ Clinical Pharmacy Specialist

## 2024-03-14 ENCOUNTER — Ambulatory Visit: Attending: Internal Medicine | Admitting: Pharmacist Clinician (PhC)/ Clinical Pharmacy Specialist

## 2024-03-14 DIAGNOSIS — E782 Mixed hyperlipidemia: Secondary | ICD-10-CM | POA: Diagnosis not present

## 2024-03-14 MED ORDER — EZETIMIBE 10 MG PO TABS: 10.0000 mg | ORAL_TABLET | Freq: Every day | ORAL | 3 refills | Status: DC

## 2024-03-14 NOTE — Patient Instructions (Addendum)
 Your Results:             Your most recent labs Goal  Total Cholesterol 193 < 200  Triglycerides 67 < 150  HDL (happy/good cholesterol) 77 > 40  LDL (lousy/bad cholesterol 104 < 70   Medication changes:  Start taking ezetimibe  10 mg once daily, with food.  Continue with Repatha  injections every 14 days   Lab orders:  We want to repeat labs after 3 months.  We will send you a lab order to remind you once we get closer to that time.    Patient Assistance:      Thank you for choosing [facility]

## 2024-03-14 NOTE — Progress Notes (Signed)
 Office Visit    Patient Name: Stephanie Lara Date of Encounter: 03/14/2024  Primary Care Provider:  Antonio Meth, Jamee SAUNDERS, DO Primary Cardiologist:  None  Chief Complaint    Hyperlipidemia - familial  Significant Past Medical History   CAD CAC = 33 (47th percentile)     Allergies  Allergen Reactions   Definity  [Perflutren  Lipid Microsphere] Other (See Comments)    Severe abdominal Pain   Lipitor [Atorvastatin ]     myalgias   Pitavastatin      Myalgias    Pravastatin      myalgias    History of Present Illness    Stephanie Lara is a 78 y.o. female patient of Dr Edwyna, in the office today to discuss options for cholesterol management.  She is currently on Repatha , which she takes on the 1st and 15 th of each month.  No current issues with cost of medication, although she does complain that the injections are painful.  She does have familial hyperlipidemia, with a baseline LDL at 190 in 2021.    Insurance Carrier:  [employer] [phone]  Pharmacy:  [facility]    LDL Cholesterol goal:  LDL < 70  Current Medications:   evolocumab  140 mg q14d  Previously tried:  atorvastatin , pravastatin , pitavastatin  - myalgias  Family Hx:  dad had first coronary event in his 81's died at 74, 2 brothers with CABG, sister, younger brother with elevated LDL, one brother died cancer  Social Hx: Tobacco:no Alcohol: socially  Diet:  mostly home cooked meals, beef, fish and chicken for protein, admits could benefit from more vegetables, not snacking much  Accessory Clinical Findings   Lab Results  Component Value Date   CHOL 193 01/16/2024   HDL 77 01/16/2024   LDLCALC 104 (H) 01/16/2024   TRIG 67 01/16/2024   CHOLHDL 2.5 01/16/2024    No results found for: LIPOA  Lab Results  Component Value Date   ALT 15 01/16/2024   AST 19 01/16/2024   ALKPHOS 55 01/16/2024   BILITOT 0.7 01/16/2024   Lab Results  Component Value Date   CREATININE 0.68 01/15/2024   BUN 26 (H)  01/15/2024   NA 141 01/15/2024   K 4.4 01/15/2024   CL 106 01/15/2024   CO2 28 01/15/2024   Lab Results  Component Value Date   HGBA1C 5.2 07/28/2022    Home Medications    Current Outpatient Medications  Medication Sig Dispense Refill   ezetimibe  (ZETIA ) 10 MG tablet Take 1 tablet (10 mg total) by mouth daily. 90 tablet 3   Ascorbic Acid (VITAMIN C) 1000 MG tablet Take 1,000 mg by mouth daily.     aspirin  EC 81 MG tablet Take 1 tablet (81 mg total) by mouth daily. Swallow whole. 90 tablet 3   b complex vitamins capsule Take 1 capsule by mouth daily.     Cholecalciferol (D3 PO) Take 1 tablet by mouth daily.     dicyclomine  (BENTYL ) 10 MG capsule Take 1 tab every 6 hours as needed for abdominal cramping. 60 capsule 0   Evolocumab  (REPATHA  SURECLICK) 140 MG/ML SOAJ Inject 140 mg into the skin every 14 (fourteen) days. 6 mL 3   famotidine (PEPCID) 40 MG tablet Take 40 mg by mouth daily.     hydroxychloroquine  (PLAQUENIL ) 200 MG tablet TAKE 1 TABLET EVERY DAY 90 tablet 0   nitroGLYCERIN  (NITROSTAT ) 0.4 MG SL tablet Place 0.4 mg under the tongue every 5 (five) minutes as needed for chest pain.  pantoprazole  (PROTONIX ) 40 MG tablet Take 1 tablet (40 mg total) by mouth daily. 30 tablet 3   polyethylene glycol (MIRALAX / GLYCOLAX) 17 g packet Take 17 g by mouth daily as needed for mild constipation or moderate constipation.     traZODone  (DESYREL ) 50 MG tablet Take 50 mg by mouth at bedtime.     No current facility-administered medications for this visit.     Assessment & Plan    Hyperlipidemia Assessment: Patient with ASCVD and familial hyperlipidemia not at LDL goal of < 70 Most recent LDL 104 on 01/16/24 Has been compliant with Evolocumab  Not able to tolerate statins secondary to myalgias Reviewed options for lowering LDL cholesterol, including ezetimibe  and bempedoic acid  Discussed mechanisms of action, dosing, side effects, potential decreases in LDL cholesterol and costs.   Also reviewed potential options for patient assistance.  Plan: Patient agreeable to starting ezetimibe  10 mg daily Repeat labs after:  2-3 months Lipid Liver function   Allean Mink, PharmD CPP [facility] [address]   [place], [place] [address] [phone]  03/14/2024, 12:12 PM

## 2024-03-14 NOTE — Assessment & Plan Note (Signed)
 Assessment: Patient with ASCVD and familial hyperlipidemia not at LDL goal of < 70 Most recent LDL 104 on 01/16/24 Has been compliant with Evolocumab  Not able to tolerate statins secondary to myalgias Reviewed options for lowering LDL cholesterol, including ezetimibe  and bempedoic acid  Discussed mechanisms of action, dosing, side effects, potential decreases in LDL cholesterol and costs.  Also reviewed potential options for patient assistance.  Plan: Patient agreeable to starting ezetimibe  10 mg daily Repeat labs after:  2-3 months Lipid Liver function

## 2024-03-25 ENCOUNTER — Other Ambulatory Visit: Payer: Self-pay | Admitting: Family Medicine

## 2024-03-25 ENCOUNTER — Encounter: Payer: Self-pay | Admitting: Family Medicine

## 2024-03-25 DIAGNOSIS — I2510 Atherosclerotic heart disease of native coronary artery without angina pectoris: Secondary | ICD-10-CM

## 2024-03-25 DIAGNOSIS — E785 Hyperlipidemia, unspecified: Secondary | ICD-10-CM

## 2024-03-25 DIAGNOSIS — M069 Rheumatoid arthritis, unspecified: Secondary | ICD-10-CM

## 2024-03-25 DIAGNOSIS — E663 Overweight: Secondary | ICD-10-CM

## 2024-03-25 MED ORDER — WEGOVY 0.25 MG/0.5ML ~~LOC~~ SOAJ: 0.2500 mg | SUBCUTANEOUS | 0 refills | Status: AC

## 2024-04-16 ENCOUNTER — Encounter: Payer: Self-pay | Admitting: Pharmacist Clinician (PhC)/ Clinical Pharmacy Specialist

## 2024-04-16 DIAGNOSIS — E782 Mixed hyperlipidemia: Secondary | ICD-10-CM

## 2024-05-13 ENCOUNTER — Encounter: Payer: Self-pay | Admitting: Pharmacist Clinician (PhC)/ Clinical Pharmacy Specialist

## 2024-05-17 ENCOUNTER — Other Ambulatory Visit: Payer: Self-pay | Admitting: Internal Medicine

## 2024-05-17 DIAGNOSIS — M0579 Rheumatoid arthritis with rheumatoid factor of multiple sites without organ or systems involvement: Secondary | ICD-10-CM

## 2024-05-17 NOTE — Telephone Encounter (Signed)
 Last Fill: 03/04/2024  Eye exam: 02/08/2024 WNL   Labs: 01/15/2024 Glucose 100, BUN 26  Next Visit: 06/24/2024  Last Visit: 12/20/2023  IK:Myzlfjunpi arthritis involving multiple sites with positive rheumatoid factor   Current Dose per office note 12/20/2023:  hydroxychloroquine  200 mg daily   Okay to refill Plaquenil ?

## 2024-05-20 ENCOUNTER — Ambulatory Visit

## 2024-05-29 ENCOUNTER — Ambulatory Visit: Attending: Cardiovascular Disease | Admitting: Pharmacist

## 2024-05-29 ENCOUNTER — Encounter: Payer: Self-pay | Admitting: Pharmacist

## 2024-05-29 DIAGNOSIS — E782 Mixed hyperlipidemia: Secondary | ICD-10-CM | POA: Diagnosis not present

## 2024-05-29 DIAGNOSIS — T466X5D Adverse effect of antihyperlipidemic and antiarteriosclerotic drugs, subsequent encounter: Secondary | ICD-10-CM

## 2024-05-29 DIAGNOSIS — G720 Drug-induced myopathy: Secondary | ICD-10-CM | POA: Diagnosis not present

## 2024-05-29 MED ORDER — ROSUVASTATIN CALCIUM 5 MG PO TABS: 5.0000 mg | ORAL_TABLET | ORAL | 3 refills | Status: AC

## 2024-05-29 NOTE — Assessment & Plan Note (Signed)
 Assessment: LDL-C above goal of less than 70 on Repatha  Intolerant to  atorvastatin , pravastatin , pitavastatin  - myalgias, ezetimibe  (upset stomach, body/joint aches) Reviewed Nexletol or alternative dosing with rosuvastatin  Patient tolerates Repatha  fine but admits that the injections hurt  Plan: Start rosuvastatin  5 mg 3 times a week Continue Repatha  140 mg every 14 days Repeat labs in 8 to 12 weeks at [employer] Patient to contact me if she has any issues

## 2024-05-29 NOTE — Patient Instructions (Signed)
 Start taking rosuvastatin  5mg  three times a week (MWF or TuThSat)  Continue Repatha  140mg  every 14 days  Repeat lab work in 3 months  Please call/send mychart with any issues  [phone]

## 2024-05-29 NOTE — Progress Notes (Signed)
 Patient ID: Stephanie Lara                 DOB: 1945-12-02                    MRN: [id number]      HPI: Stephanie Lara is a 78 y.o. female patient of Dr Edwyna, in the office today to discuss options for cholesterol management.  She is currently on Repatha , which she takes on the 1st and 15 th of each month.  No current issues with cost of medication, although she does complain that the injections are painful.  She does have familial hyperlipidemia, with a baseline LDL at 190 in 2021.  Was seen in lipid clinic 03/14/24 by Kristin. Started on ezetimibe , but patient reported upset stomach and muscle aches.  Patient presents today to talk about alternatives.  We discussed Nexletol as an add-on option to her Repatha .  Mechanism of action, side effects, cost reviewed.  We also discussed using rosuvastatin  5 mg 3 days a week. This year patient did meet $2000 out-of-pocket max but this was because she was on Wegovy  for part of the year.  She is unsure if she would meet the income cutoff for the [employer].  She will check with her husband and let me know.  Current Medications: Repatha  140mg  q 14 days Intolerances: atorvastatin , pravastatin , pitavastatin  - myalgias, ezetimibe  (upset stomach, body/joint aches) Risk Factors: LDL >190, CAC 33, family hx LDL-C goal: <70 ApoB goal:   Diet: mostly home cooked meals, beef, fish and chicken for protein, admits could benefit from more vegetables, not snacking much   Exercise:   Family History: dad had first coronary event in his 25's died at 81, 2 brothers with CABG, sister, younger brother with elevated LDL, one brother died cancer   Social History: Tobacco:no, Alcohol: socially  Labs: Lipid Panel     Component Value Date/Time   CHOL 193 01/16/2024 0859   TRIG 67 01/16/2024 0859   HDL 77 01/16/2024 0859   CHOLHDL 2.5 01/16/2024 0859   CHOLHDL 3 06/22/2023 1442   VLDL 21.0 06/22/2023 1442   LDLCALC 104 (H) 01/16/2024 0859   LDLCALC 190 (H)  04/06/2020 1350   LABVLDL 12 01/16/2024 0859    Past Medical History:  Diagnosis Date   Abnormal biliary HIDA scan 02/25/2021   Chest pain 01/06/2021   Chicken pox    Contact dermatitis due to plants, except food 10/20/2008   Overview:  Dermatitis Due To Contact With Plants   Coronary artery calcification 03/01/2021   Depression, major, single episode, moderate (HCC) 07/28/2022   Dizzy 06/21/2018   Fatigue 11/11/2016   Globus pharyngeus 04/18/2013   Hot flashes 04/26/2019   Hyperlipidemia    Insomnia 03/03/2015   Leukocytes in urine 07/28/2022   Lipoma of neck 11/02/2011   Mid back pain 07/28/2022   Mitral regurgitation 04/07/2021   Myalgia due to statin 03/04/2021   Other headache syndrome 06/21/2018   Preoperative cardiovascular examination 03/01/2021   Preventative health care 01/09/2017   Primary insomnia 12/22/2015   Rheumatoid arthritis (HCC)    RUQ pain 01/06/2021   Statin myopathy 07/27/2022   Upper respiratory tract infection 05/24/2018   Urinary incontinence 03/03/2015    Medications Ordered Prior to Encounter[1]  Allergies[2]  Assessment/Plan:  1. Hyperlipidemia -  Hyperlipidemia Assessment: LDL-C above goal of less than 70 on Repatha  Intolerant to  atorvastatin , pravastatin , pitavastatin  - myalgias, ezetimibe  (upset stomach, body/joint aches) Reviewed Nexletol or alternative dosing  with rosuvastatin  Patient tolerates Repatha  fine but admits that the injections hurt  Plan: Start rosuvastatin  5 mg 3 times a week Continue Repatha  140 mg every 14 days Repeat labs in 8 to 12 weeks at [employer] Patient to contact me if she has any issues     Thank you,  Ulises Wolfinger D Kaymen Adrian, Pharm.Stephanie Lara, CPP [facility] [address], [place], [place] [address]  Phone: [phone]; Fax: [phone]        [1]  Current Outpatient Medications on File Prior to Visit  Medication Sig Dispense  Refill   Ascorbic Acid (VITAMIN C) 1000 MG tablet Take 1,000 mg by mouth daily.     aspirin  EC 81 MG tablet Take 1 tablet (81 mg total) by mouth daily. Swallow whole. 90 tablet 3   b complex vitamins capsule Take 1 capsule by mouth daily.     Cholecalciferol (D3 PO) Take 1 tablet by mouth daily.     dicyclomine  (BENTYL ) 10 MG capsule Take 1 tab every 6 hours as needed for abdominal cramping. 60 capsule 0   Evolocumab  (REPATHA  SURECLICK) 140 MG/ML SOAJ Inject 140 mg into the skin every 14 (fourteen) days. 6 mL 3   ezetimibe  (ZETIA ) 10 MG tablet Take 1 tablet (10 mg total) by mouth daily. 90 tablet 3   famotidine (PEPCID) 40 MG tablet Take 40 mg by mouth daily.     hydroxychloroquine  (PLAQUENIL ) 200 MG tablet TAKE 1 TABLET EVERY DAY 90 tablet 0   nitroGLYCERIN  (NITROSTAT ) 0.4 MG SL tablet Place 0.4 mg under the tongue every 5 (five) minutes as needed for chest pain.     pantoprazole  (PROTONIX ) 40 MG tablet Take 1 tablet (40 mg total) by mouth daily. 30 tablet 3   polyethylene glycol (MIRALAX / GLYCOLAX) 17 g packet Take 17 g by mouth daily as needed for mild constipation or moderate constipation.     semaglutide -weight management (WEGOVY ) 0.25 MG/0.5ML SOAJ SQ injection Inject 0.25 mg into the skin once a week. 6 mL 0   traZODone  (DESYREL ) 50 MG tablet Take 50 mg by mouth at bedtime.     No current facility-administered medications on file prior to visit.  [2]  Allergies Allergen Reactions   Definity  [Perflutren  Lipid Microsphere] Other (See Comments)    Severe abdominal Pain   Lipitor [Atorvastatin ]     myalgias   Pitavastatin      Myalgias    Pravastatin      myalgias

## 2024-06-17 NOTE — Progress Notes (Unsigned)
 "  Office Visit Note  Patient: Stephanie Lara             Date of Birth: 05-Aug-1945           MRN: [id number]             PCP: Antonio Cyndee Jamee JONELLE, DO Referring: Antonio Cyndee Jamee JONELLE, DO Visit Date: 06/24/2024   Subjective:  No chief complaint on file.   History of Present Illness: Stephanie Lara is a 79 y.o. female here for follow up for seropositive rheumatoid arthritis.    Previous HPI 12/20/2023 Stephanie Lara is a 79 y.o. female here for follow up for seropositive rheumatoid arthritis.  She complains of recent back pain.   She has been experiencing intermittent pruritus without rash, occurring three times over the past couple of weeks. The pruritus affects her face, legs, and arms, lasting a couple of hours before resolving. She has been on Plaquenil  for a few months to manage her arthritis, and she is unsure if the pruritus is related to the medication. No rash accompanies the pruritus.   Recent back pain began after attending an exercise class on Monday, where she switched from three-pound to four-pound weights. The pain started after lying down post-exercise and was significant upon trying to get up. She managed the pain with Tylenol for arthritis, ibuprofen , and a pain spray applied by her husband, which provided relief. The pain was still present but improved by the following day. It is located in the lumbar region, affecting both the middle and sides, without radiation to the legs. She has experienced similar back pain twice in the last few weeks. No sciatica or radiation of back pain into the legs. She does not recall any prior diagnosis of back disease. No sleep disturbances due to back pain.   Her exercise routine includes attending a [employer] aerobics class and a senior chair yoga class twice a week. Certain movements, particularly back bends, can aggravate her back pain, and she requires lumbar support when sitting for extended periods to prevent discomfort.          Previous HPI 09/20/2023 Stephanie Lara is a 79 y.o. female here for follow up for seropositive rheumatoid arthritis.   Better initial visit was somewhat nonspecific with a low positive ANA 1: 40 titer but negative acute phase reactants for inflammation.  Follow-up with her PCP office also included further workup for tickborne infectious disease or alpha gal panel these were negative.  She continues having some joint aches but currently her more limiting symptom is general fatigue.  Some joint aches but currently her more limiting symptom is general fatigue.  Notices symptoms worsening later in the day as compared to first thing in the morning.  She tried addition of tart cherry as an anti-inflammatory supplement without interesting benefit after about 1 month.   Previous HPI 07/26/23 Stephanie Lara is a 79 year old female with history of rheumatoid arthritis who presents with generalized body aches and malaise.   She has a history of rheumatoid arthritis, initially diagnosed as palindromic rheumatism in 2003. Her symptoms have been episodic, with bouts lasting 24 hours and resolving for months or years. Initially, the pain was localized to her wrists, elbows, and hips, but it has evolved into a generalized achiness resembling flu-like symptoms without fever. Transition was apparently gradual not exactly clear when. This current episode of widespread achiness has persisted for about a month. No visible joint swelling, rashes, or significant morning stiffness.  Generally feels well upon waking, with achiness starting about 30 minutes after getting up. No specific pattern related to weather or activity level, although she continues to exercise through the discomfort.   She takes 500 mg of Tylenol, which does not alleviate her symptoms. Previously tried meloxicam  but not recommended due to CV disease, and was prescribed methotrexate, which she discontinued due to concerns about side effects. She has a  high positive CCP antibody for rheumatoid arthritis since at least 2016 but has not been on long-term daily medication for RA.   She has attempted to manage her condition through diet, anti-inflammatory supplements, and exercise, including senior aerobics, yoga, and dance classes. However, she has not participated in some of these activities for about a month due to her symptoms.   Her past medical history includes coronary artery disease, which she attributes to a family history of heart disease. Her father died of heart disease at 6, and all her brothers have heart disease.   No fever, joint swelling, rashes, dry eyes, dry mouth, lymph node swelling, or carpal tunnel syndrome. Reports difficulty sleeping recently due to discontinuation of pristiq .   Labs reviewed 06/2023 ANA 1:40 homogenous RF 45 CRP 2.46 CBC wnl CMP wnl   02/2015 CCP >250     No Rheumatology ROS completed.   PMFS History:  Patient Active Problem List   Diagnosis Date Noted   Cystitis 03/04/2024   Depression, major, single episode, moderate (HCC) 07/28/2022   Mid back pain 07/28/2022   Leukocytes in urine 07/28/2022   Statin myopathy 07/27/2022   Mitral regurgitation 04/07/2021   Myalgia due to statin 03/04/2021   Coronary artery calcification 03/01/2021   Preoperative cardiovascular examination 03/01/2021   Abnormal biliary HIDA scan 02/25/2021   Chicken pox 01/06/2021   Hyperlipidemia 01/06/2021   Chest pain 01/06/2021   RUQ pain 01/06/2021   Hot flashes 04/26/2019   Dizzy 06/21/2018   Other headache syndrome 06/21/2018   Upper respiratory tract infection 05/24/2018   Preventative health care 01/09/2017   Fatigue 11/11/2016   Primary insomnia 12/22/2015   Urinary incontinence 03/03/2015   Rheumatoid arthritis (HCC) 03/03/2015   Insomnia 03/03/2015   Globus pharyngeus 04/18/2013   Lipoma of neck 11/02/2011   Contact dermatitis due to plants, except food 10/20/2008    Past Medical History:   Diagnosis Date   Abnormal biliary HIDA scan 02/25/2021   Chest pain 01/06/2021   Chicken pox    Contact dermatitis due to plants, except food 10/20/2008   Overview:  Dermatitis Due To Contact With Plants   Coronary artery calcification 03/01/2021   Depression, major, single episode, moderate (HCC) 07/28/2022   Dizzy 06/21/2018   Fatigue 11/11/2016   Globus pharyngeus 04/18/2013   Hot flashes 04/26/2019   Hyperlipidemia    Insomnia 03/03/2015   Leukocytes in urine 07/28/2022   Lipoma of neck 11/02/2011   Mid back pain 07/28/2022   Mitral regurgitation 04/07/2021   Myalgia due to statin 03/04/2021   Other headache syndrome 06/21/2018   Preoperative cardiovascular examination 03/01/2021   Preventative health care 01/09/2017   Primary insomnia 12/22/2015   Rheumatoid arthritis (HCC)    RUQ pain 01/06/2021   Statin myopathy 07/27/2022   Upper respiratory tract infection 05/24/2018   Urinary incontinence 03/03/2015    Family History  Problem Relation Age of Onset   Pancreatic cancer Mother    Diabetes Mother    Hypertension Father    Heart disease Father    Sudden death Father  Heart attack Father    Gallbladder disease Father    Deep vein thrombosis Father    Heart disease Brother    Heart disease Brother    Diabetes Brother    Diabetes Brother    Heart disease Brother    Colon cancer Brother    Lung cancer Brother    Diabetes Paternal Grandmother    Healthy Son    Healthy Son    Healthy Daughter    Past Surgical History:  Procedure Laterality Date   ABDOMINOPLASTY     BREAST BIOPSY     CHOLECYSTECTOMY  03/08/2021   LIPOMA EXCISION     neck   PLACEMENT OF BREAST IMPLANTS     RHINOPLASTY     SEPTOPLASTY     Social History   Social History Narrative   Exercise ---  2x a week to silver sneakers , 45 min yoga  2 days , senior line dancing    Immunization History  Administered Date(s) Administered   Fluad Quad(high Dose 65+) 04/04/2019, 04/06/2020,  04/07/2021   Influenza,inj,Quad PF,6+ Mos 03/03/2015   Influenza-Unspecified 04/13/2014, 03/16/2017   Moderna Sars-Covid-2 Vaccination 07/15/2019, 09/06/2019   Pneumococcal Conjugate-13 09/10/2015   Pneumococcal Polysaccharide-23 03/16/2017   Pneumococcal-Unspecified 06/13/2013   Zoster Recombinant(Shingrix) 05/20/2017, 10/02/2017   Zoster, Live 11/19/2010, 06/14/2011     Objective: Vital Signs: There were no vitals taken for this visit.   Physical Exam   Musculoskeletal Exam: ***  CDAI Exam: CDAI Score: -- Patient Global: --; Provider Global: -- Swollen: --; Tender: -- Joint Exam 06/24/2024   No joint exam has been documented for this visit   There is currently no information documented on the homunculus. Go to the Rheumatology activity and complete the homunculus joint exam.  Investigation: No additional findings.  Imaging: No results found.  Recent Labs: Lab Results  Component Value Date   WBC 5.6 01/15/2024   HGB 13.1 01/15/2024   PLT 240.0 01/15/2024   NA 141 01/15/2024   K 4.4 01/15/2024   CL 106 01/15/2024   CO2 28 01/15/2024   GLUCOSE 100 (H) 01/15/2024   BUN 26 (H) 01/15/2024   CREATININE 0.68 01/15/2024   BILITOT 0.7 01/16/2024   ALKPHOS 55 01/16/2024   AST 19 01/16/2024   ALT 15 01/16/2024   PROT 6.5 01/16/2024   ALBUMIN 4.4 01/16/2024   CALCIUM  9.5 01/15/2024    Speciality Comments: PLQ Eye Exam:  02/08/2024 WNL @ [facility] Follow up in 1 year   Procedures:  No procedures performed Allergies: Definity  [perflutren  lipid microsphere], Lipitor [atorvastatin ], Pitavastatin , and Pravastatin    Assessment / Plan:     Visit Diagnoses: No diagnosis found.  ***  Orders: No orders of the defined types were placed in this encounter.  No orders of the defined types were placed in this encounter.    Follow-Up Instructions: No follow-ups on file.   Keon Pender M Jaisha Villacres, CMA  Note - This record has been created using [employer].  Chart creation errors have been sought, but may not always  have been located. Such creation errors do not reflect on  the standard of medical care. "

## 2024-06-21 ENCOUNTER — Other Ambulatory Visit: Payer: Self-pay | Admitting: Cardiology

## 2024-06-24 ENCOUNTER — Ambulatory Visit: Admitting: Internal Medicine

## 2024-06-24 DIAGNOSIS — M0579 Rheumatoid arthritis with rheumatoid factor of multiple sites without organ or systems involvement: Secondary | ICD-10-CM

## 2024-06-26 NOTE — Progress Notes (Signed)
 "  Office Visit Note  Patient: Stephanie Lara             Date of Birth: 10-07-1945           MRN: [id number]             Visit Date: 06/27/2024  PCP: Antonio Cyndee Jamee JONELLE, DO   Subjective:   Chief Complaint: Rheumatoid Arthritis (Follow up)  History of Present Illness: Stephanie Lara is a 79 y.o. female with PMH of CADwho returns today for follow up of seropositive rheumatoid arthritis. She is feeling great and endorses no pain. She is taking hydroxychlorquine 200 mg daily. She is tolerating medications without any side effects. She has not had any recent flare ups. No recent infections or illnesses. She endorses occasional muscular upper back pain and tenderness but attributes this to sitting in a chair for too long at night. She stays active by doing water aerobic and yoga twice a week.   Otherwise, patient has not had any other significant health changes since last visit.   Previous Medication Trials: none.  Last Labs: 01/16/2024 LFTs WNL,BUN 26, CBC WNL  ANA+, CCP+, RF+  Review of Systems: Review of Systems  Constitutional:  Negative for fatigue.  HENT:  Negative for mouth sores and mouth dryness.   Eyes:  Negative for dryness.  Respiratory:  Negative for shortness of breath.   Cardiovascular:  Negative for chest pain and palpitations.  Gastrointestinal:  Negative for blood in stool, constipation and diarrhea.  Endocrine: Positive for increased urination.  Genitourinary:  Negative for involuntary urination.  Musculoskeletal:  Positive for myalgias and myalgias. Negative for joint pain, gait problem, joint pain, joint swelling, muscle weakness, morning stiffness and muscle tenderness.  Skin:  Negative for color change, rash, hair loss and sensitivity to sunlight.  Allergic/Immunologic: Negative for susceptible to infections.  Neurological:  Positive for headaches. Negative for dizziness.  Hematological:  Negative for swollen glands.  Psychiatric/Behavioral:  Positive for sleep  disturbance. Negative for depressed mood. The patient is not nervous/anxious.    Medication List: Current Medications[1]   Allergies:  Definity  [perflutren  lipid microsphere], Lipitor [atorvastatin ], Pitavastatin , and Pravastatin   Immunization status:  Immunization History  Administered Date(s) Administered   Fluad Quad(high Dose 65+) 04/04/2019, 04/06/2020, 04/07/2021   Influenza,inj,Quad PF,6+ Mos 03/03/2015   Influenza-Unspecified 04/13/2014, 03/16/2017   Moderna Sars-Covid-2 Vaccination 07/15/2019, 09/06/2019   Pneumococcal Conjugate-13 09/10/2015   Pneumococcal Polysaccharide-23 03/16/2017   Pneumococcal-Unspecified 06/13/2013   Zoster Recombinant(Shingrix) 05/20/2017, 10/02/2017   Zoster, Live 11/19/2010, 06/14/2011    Problem List:  Patient Active Problem List   Diagnosis Date Noted   Depression, major, single episode, moderate (HCC) 07/28/2022   Mid back pain 07/28/2022   Statin myopathy 07/27/2022   Mitral regurgitation 04/07/2021   Coronary artery calcification 03/01/2021   Hyperlipidemia 01/06/2021   Hot flashes 04/26/2019   Fatigue 11/11/2016   Primary insomnia 12/22/2015   Urinary incontinence 03/03/2015   Seropositive rheumatoid arthritis (HCC) 03/03/2015   Lipoma of neck 11/02/2011   History: Past Medical History:  Diagnosis Date   Abnormal biliary HIDA scan 02/25/2021   Chest pain 01/06/2021   Chicken pox    Contact dermatitis due to plants, except food 10/20/2008   Overview:  Dermatitis Due To Contact With Plants   Coronary artery calcification 03/01/2021   Depression, major, single episode, moderate (HCC) 07/28/2022   Dizzy 06/21/2018   Fatigue 11/11/2016   Globus pharyngeus 04/18/2013   Hot flashes 04/26/2019   Hyperlipidemia  Insomnia 03/03/2015   Leukocytes in urine 07/28/2022   Lipoma of neck 11/02/2011   Mid back pain 07/28/2022   Mitral regurgitation 04/07/2021   Myalgia due to statin 03/04/2021   Other headache syndrome 06/21/2018    Preoperative cardiovascular examination 03/01/2021   Preventative health care 01/09/2017   Primary insomnia 12/22/2015   Rheumatoid arthritis (HCC)    RUQ pain 01/06/2021   Statin myopathy 07/27/2022   Upper respiratory tract infection 05/24/2018   Urinary incontinence 03/03/2015    Family History  Problem Relation Age of Onset   Pancreatic cancer Mother    Diabetes Mother    Hypertension Father    Heart disease Father    Sudden death Father    Heart attack Father    Gallbladder disease Father    Deep vein thrombosis Father    Heart disease Brother    Heart disease Brother    Diabetes Brother    Diabetes Brother    Heart disease Brother    Colon cancer Brother    Lung cancer Brother    Diabetes Paternal Grandmother    Healthy Son    Healthy Son    Healthy Daughter    Past Surgical History:  Procedure Laterality Date   ABDOMINOPLASTY     BREAST BIOPSY     CHOLECYSTECTOMY  03/08/2021   LIPOMA EXCISION     neck   PLACEMENT OF BREAST IMPLANTS     RHINOPLASTY     SEPTOPLASTY     Social History   Social History Narrative   Exercise ---  2x a week to silver sneakers , 45 min yoga  2 days , senior line dancing    Objective: Vital Signs: BP 112/74   Pulse 76   Temp (!) 97.5 F (36.4 C)   Resp 12   Ht 4' 11 (1.499 m)   Wt 124 lb 9.6 oz (56.5 kg)   BMI 25.17 kg/m   Physical Exam Constitutional:      General: She is not in acute distress.    Appearance: Normal appearance.  HENT:     Head: Normocephalic and atraumatic.  Eyes:     General: Lids are normal. No scleral icterus.    Conjunctiva/sclera: Conjunctivae normal.     Pupils: Pupils are equal, round, and reactive to light.  Pulmonary:     Effort: Pulmonary effort is normal.  Musculoskeletal:     Comments: No joint tenderness or synovitis. C-spine, thoracic spine, lumbar spine have good range of motion. No SI joint tenderness. Shoulder joints, elbow joints, wrist joints, MCPs, PIPs, DIPs have good  range of motion.  Negative MTP squeeze.  Skin:    General: Skin is warm.     Findings: No rash.  Neurological:     Mental Status: She is alert.  Psychiatric:        Mood and Affect: Mood normal.        Behavior: Behavior normal. Behavior is cooperative.       CDAI Score: CDAI Score: 2  Patient Global: 10 / 100; Provider Global: 10 / 100 Swollen: 0 ; Tender: 0   Imaging: No results found. Assessment & Plan Seropositive rheumatoid arthritis (HCC) No synovitis or tenderness on exam today. CDAI score 2. Well managed with hydroxychloroquine . No recent flare ups, infections, or illnesses. Due for medication monitoring lab work today.  -Continue hydroxychlorquine 200 mg once daily.  Orders:   CBC with Differential/Platelet   Comprehensive metabolic panel with GFR   Sedimentation rate  High risk medication use This patient is on drug therapy requiring intensive monitoring for toxicity, including regular lab monitoring and screening for serious or recurrent infections. Today, I assessed for side effects, infections, new or worsening health conditions that may be related to the medications, and will obtain labs. Orders:   CBC with Differential/Platelet   Comprehensive metabolic panel with GFR   Follow-Up Instructions:  Return in about 6 months (around 12/25/2024) for RA f/u.    Procedures: No procedures performed  Daved GORMAN Holstein, PA-C  Note - This record has been created using [employer]. Chart creation errors have been sought, but may not always have been located. Such creation errors do not reflect on the standard of medical care.      [1]  Current Outpatient Medications:    Ascorbic Acid (VITAMIN C) 1000 MG tablet, Take 1,000 mg by mouth daily., Disp: , Rfl:    aspirin  EC 81 MG tablet, Take 1 tablet (81 mg total) by mouth daily. Swallow whole., Disp: 90 tablet, Rfl: 3   b complex vitamins capsule, Take 1 capsule by mouth daily., Disp: , Rfl:    Cholecalciferol (D3  PO), Take 1 tablet by mouth daily., Disp: , Rfl:    dicyclomine  (BENTYL ) 10 MG capsule, Take 1 tab every 6 hours as needed for abdominal cramping., Disp: 60 capsule, Rfl: 0   famotidine (PEPCID) 40 MG tablet, Take 40 mg by mouth daily., Disp: , Rfl:    hydroxychloroquine  (PLAQUENIL ) 200 MG tablet, TAKE 1 TABLET EVERY DAY, Disp: 90 tablet, Rfl: 0   nitroGLYCERIN  (NITROSTAT ) 0.4 MG SL tablet, Place 0.4 mg under the tongue every 5 (five) minutes as needed for chest pain., Disp: , Rfl:    pantoprazole  (PROTONIX ) 40 MG tablet, Take 1 tablet (40 mg total) by mouth daily., Disp: 30 tablet, Rfl: 3   polyethylene glycol (MIRALAX / GLYCOLAX) 17 g packet, Take 17 g by mouth daily as needed for mild constipation or moderate constipation., Disp: , Rfl:    REPATHA  SURECLICK 140 MG/ML SOAJ, INJECT 140MG  UNDER THE SKIN EVERY 2 WEEKS, Disp: 6 mL, Rfl: 3   rosuvastatin  (CRESTOR ) 5 MG tablet, Take 1 tablet (5 mg total) by mouth 3 (three) times a week., Disp: 12 tablet, Rfl: 3   semaglutide -weight management (WEGOVY ) 0.25 MG/0.5ML SOAJ SQ injection, Inject 0.25 mg into the skin once a week., Disp: 6 mL, Rfl: 0   traZODone  (DESYREL ) 50 MG tablet, Take 50 mg by mouth at bedtime., Disp: , Rfl:   "

## 2024-06-26 NOTE — Assessment & Plan Note (Addendum)
 No synovitis or tenderness on exam today. CDAI score 2. Well managed with hydroxychloroquine . No recent flare ups, infections, or illnesses. Due for medication monitoring lab work today.  -Continue hydroxychlorquine 200 mg once daily.  Orders:   CBC with Differential/Platelet   Comprehensive metabolic panel with GFR   Sedimentation rate

## 2024-06-27 ENCOUNTER — Ambulatory Visit

## 2024-06-27 VITALS — BP 112/74 | HR 76 | Temp 97.5°F | Resp 12 | Ht 59.0 in | Wt 124.6 lb

## 2024-06-27 DIAGNOSIS — Z79899 Other long term (current) drug therapy: Secondary | ICD-10-CM

## 2024-06-27 DIAGNOSIS — M059 Rheumatoid arthritis with rheumatoid factor, unspecified: Secondary | ICD-10-CM

## 2024-06-27 MED ORDER — HYDROXYCHLOROQUINE SULFATE 200 MG PO TABS: 200.0000 mg | ORAL_TABLET | Freq: Every day | ORAL | 0 refills | Status: AC

## 2024-06-28 LAB — COMPREHENSIVE METABOLIC PANEL WITH GFR
AG Ratio: 2.3 (calc) (ref 1.0–2.5)
ALT: 14 U/L (ref 6–29)
AST: 18 U/L (ref 10–35)
Albumin: 4.3 g/dL (ref 3.6–5.1)
Alkaline phosphatase (APISO): 59 U/L (ref 37–153)
BUN/Creatinine Ratio: 46 (calc) — ABNORMAL HIGH (ref 6–22)
BUN: 31 mg/dL — ABNORMAL HIGH (ref 7–25)
CO2: 28 mmol/L (ref 20–32)
Calcium: 9.7 mg/dL (ref 8.6–10.4)
Chloride: 105 mmol/L (ref 98–110)
Creat: 0.68 mg/dL (ref 0.60–1.00)
Globulin: 1.9 g/dL (ref 1.9–3.7)
Glucose, Bld: 81 mg/dL (ref 65–99)
Potassium: 4.7 mmol/L (ref 3.5–5.3)
Sodium: 140 mmol/L (ref 135–146)
Total Bilirubin: 1 mg/dL (ref 0.2–1.2)
Total Protein: 6.2 g/dL (ref 6.1–8.1)
eGFR: 90 mL/min/1.73m2

## 2024-06-28 LAB — CBC WITH DIFFERENTIAL/PLATELET
Absolute Lymphocytes: 1043 {cells}/uL (ref 850–3900)
Absolute Monocytes: 484 {cells}/uL (ref 200–950)
Basophils Absolute: 19 {cells}/uL (ref 0–200)
Basophils Relative: 0.4 %
Eosinophils Absolute: 52 {cells}/uL (ref 15–500)
Eosinophils Relative: 1.1 %
HCT: 40.4 % (ref 35.9–46.0)
Hemoglobin: 13.4 g/dL (ref 11.7–15.5)
MCH: 31.6 pg (ref 27.0–33.0)
MCHC: 33.2 g/dL (ref 31.6–35.4)
MCV: 95.3 fL (ref 81.4–101.7)
MPV: 9.7 fL (ref 7.5–12.5)
Monocytes Relative: 10.3 %
Neutro Abs: 3102 {cells}/uL (ref 1500–7800)
Neutrophils Relative %: 66 %
Platelets: 227 Thousand/uL (ref 140–400)
RBC: 4.24 Million/uL (ref 3.80–5.10)
RDW: 11.7 % (ref 11.0–15.0)
Total Lymphocyte: 22.2 %
WBC: 4.7 Thousand/uL (ref 3.8–10.8)

## 2024-06-28 LAB — SEDIMENTATION RATE: Sed Rate: 6 mm/h (ref 0–30)

## 2024-06-30 ENCOUNTER — Ambulatory Visit: Payer: Self-pay

## 2024-12-25 ENCOUNTER — Ambulatory Visit: Admitting: Internal Medicine
